# Patient Record
Sex: Female | Born: 1950
Health system: Southern US, Community
[De-identification: ages and names within clinical notes are randomized; demographics above are authoritative.]

## PROBLEM LIST (undated history)

## (undated) DIAGNOSIS — B2790 Infectious mononucleosis, unspecified without complication: Secondary | ICD-10-CM

## (undated) DIAGNOSIS — G43909 Migraine, unspecified, not intractable, without status migrainosus: Secondary | ICD-10-CM

## (undated) DIAGNOSIS — I509 Heart failure, unspecified: Secondary | ICD-10-CM

## (undated) DIAGNOSIS — D649 Anemia, unspecified: Secondary | ICD-10-CM

## (undated) DIAGNOSIS — E039 Hypothyroidism, unspecified: Secondary | ICD-10-CM

## (undated) DIAGNOSIS — I10 Essential (primary) hypertension: Secondary | ICD-10-CM

## (undated) DIAGNOSIS — D219 Benign neoplasm of connective and other soft tissue, unspecified: Secondary | ICD-10-CM

## (undated) DIAGNOSIS — G8929 Other chronic pain: Secondary | ICD-10-CM

## (undated) DIAGNOSIS — R5382 Chronic fatigue, unspecified: Secondary | ICD-10-CM

## (undated) HISTORY — PX: TONSILLECTOMY: SUR1361

## (undated) HISTORY — DX: Anemia, unspecified: D64.9

## (undated) HISTORY — DX: Hypothyroidism, unspecified: E03.9

---

## 2020-12-12 ENCOUNTER — Encounter (HOSPITAL_COMMUNITY): Payer: Self-pay | Admitting: Emergency Medicine

## 2020-12-12 ENCOUNTER — Emergency Department (HOSPITAL_COMMUNITY): Payer: Medicare Other

## 2020-12-12 ENCOUNTER — Inpatient Hospital Stay (HOSPITAL_COMMUNITY)
Admission: EM | Admit: 2020-12-12 | Discharge: 2020-12-18 | DRG: 291 | Disposition: A | Discharge status: Home Health | Payer: Medicare Other | Attending: Student | Admitting: Student

## 2020-12-12 DIAGNOSIS — I130 Hypertensive heart and chronic kidney disease with heart failure and stage 1 through stage 4 chronic kidney disease, or unspecified chronic kidney disease: Principal | ICD-10-CM | POA: Diagnosis present

## 2020-12-12 DIAGNOSIS — Z8249 Family history of ischemic heart disease and other diseases of the circulatory system: Secondary | ICD-10-CM | POA: Diagnosis not present

## 2020-12-12 DIAGNOSIS — N1831 Chronic kidney disease, stage 3a: Secondary | ICD-10-CM | POA: Diagnosis present

## 2020-12-12 DIAGNOSIS — I161 Hypertensive emergency: Secondary | ICD-10-CM | POA: Diagnosis not present

## 2020-12-12 DIAGNOSIS — I517 Cardiomegaly: Secondary | ICD-10-CM | POA: Diagnosis not present

## 2020-12-12 DIAGNOSIS — N950 Postmenopausal bleeding: Secondary | ICD-10-CM | POA: Diagnosis not present

## 2020-12-12 DIAGNOSIS — R7989 Other specified abnormal findings of blood chemistry: Secondary | ICD-10-CM | POA: Diagnosis not present

## 2020-12-12 DIAGNOSIS — I509 Heart failure, unspecified: Secondary | ICD-10-CM

## 2020-12-12 DIAGNOSIS — I110 Hypertensive heart disease with heart failure: Secondary | ICD-10-CM | POA: Diagnosis not present

## 2020-12-12 DIAGNOSIS — D649 Anemia, unspecified: Secondary | ICD-10-CM | POA: Diagnosis present

## 2020-12-12 DIAGNOSIS — R0600 Dyspnea, unspecified: Secondary | ICD-10-CM | POA: Diagnosis not present

## 2020-12-12 DIAGNOSIS — G8929 Other chronic pain: Secondary | ICD-10-CM | POA: Diagnosis not present

## 2020-12-12 DIAGNOSIS — Z6841 Body Mass Index (BMI) 40.0 and over, adult: Secondary | ICD-10-CM

## 2020-12-12 DIAGNOSIS — N938 Other specified abnormal uterine and vaginal bleeding: Secondary | ICD-10-CM | POA: Diagnosis present

## 2020-12-12 DIAGNOSIS — I5033 Acute on chronic diastolic (congestive) heart failure: Secondary | ICD-10-CM | POA: Diagnosis not present

## 2020-12-12 DIAGNOSIS — R5381 Other malaise: Secondary | ICD-10-CM | POA: Diagnosis not present

## 2020-12-12 DIAGNOSIS — R059 Cough, unspecified: Secondary | ICD-10-CM | POA: Diagnosis not present

## 2020-12-12 DIAGNOSIS — I5031 Acute diastolic (congestive) heart failure: Secondary | ICD-10-CM | POA: Diagnosis not present

## 2020-12-12 DIAGNOSIS — D62 Acute posthemorrhagic anemia: Secondary | ICD-10-CM | POA: Diagnosis not present

## 2020-12-12 DIAGNOSIS — N858 Other specified noninflammatory disorders of uterus: Secondary | ICD-10-CM | POA: Diagnosis present

## 2020-12-12 DIAGNOSIS — E876 Hypokalemia: Secondary | ICD-10-CM | POA: Diagnosis not present

## 2020-12-12 DIAGNOSIS — Z20822 Contact with and (suspected) exposure to covid-19: Secondary | ICD-10-CM | POA: Diagnosis present

## 2020-12-12 DIAGNOSIS — I313 Pericardial effusion (noninflammatory): Secondary | ICD-10-CM | POA: Diagnosis not present

## 2020-12-12 DIAGNOSIS — I3139 Other pericardial effusion (noninflammatory): Secondary | ICD-10-CM

## 2020-12-12 DIAGNOSIS — Z8679 Personal history of other diseases of the circulatory system: Secondary | ICD-10-CM | POA: Diagnosis not present

## 2020-12-12 DIAGNOSIS — I10 Essential (primary) hypertension: Secondary | ICD-10-CM | POA: Diagnosis not present

## 2020-12-12 DIAGNOSIS — N939 Abnormal uterine and vaginal bleeding, unspecified: Secondary | ICD-10-CM

## 2020-12-12 DIAGNOSIS — I350 Nonrheumatic aortic (valve) stenosis: Secondary | ICD-10-CM | POA: Diagnosis not present

## 2020-12-12 DIAGNOSIS — Z8709 Personal history of other diseases of the respiratory system: Secondary | ICD-10-CM | POA: Diagnosis not present

## 2020-12-12 DIAGNOSIS — E039 Hypothyroidism, unspecified: Secondary | ICD-10-CM | POA: Diagnosis present

## 2020-12-12 DIAGNOSIS — R0602 Shortness of breath: Secondary | ICD-10-CM | POA: Diagnosis not present

## 2020-12-12 DIAGNOSIS — I872 Venous insufficiency (chronic) (peripheral): Secondary | ICD-10-CM | POA: Diagnosis present

## 2020-12-12 DIAGNOSIS — I361 Nonrheumatic tricuspid (valve) insufficiency: Secondary | ICD-10-CM | POA: Diagnosis not present

## 2020-12-12 DIAGNOSIS — R269 Unspecified abnormalities of gait and mobility: Secondary | ICD-10-CM | POA: Diagnosis present

## 2020-12-12 HISTORY — DX: Heart failure, unspecified: I50.9

## 2020-12-12 HISTORY — DX: Migraine, unspecified, not intractable, without status migrainosus: G43.909

## 2020-12-12 HISTORY — DX: Infectious mononucleosis, unspecified without complication: B27.90

## 2020-12-12 HISTORY — DX: Other chronic pain: G89.29

## 2020-12-12 HISTORY — DX: Chronic fatigue, unspecified: R53.82

## 2020-12-12 HISTORY — DX: Benign neoplasm of connective and other soft tissue, unspecified: D21.9

## 2020-12-12 HISTORY — DX: Essential (primary) hypertension: I10

## 2020-12-12 LAB — COMPREHENSIVE METABOLIC PANEL
ALT: 13 U/L (ref 0–44)
AST: 20 U/L (ref 15–41)
Albumin: 3.7 g/dL (ref 3.5–5.0)
Alkaline Phosphatase: 61 U/L (ref 38–126)
Anion gap: 10 (ref 5–15)
BUN: 11 mg/dL (ref 8–23)
CO2: 23 mmol/L (ref 22–32)
Calcium: 9.1 mg/dL (ref 8.9–10.3)
Chloride: 103 mmol/L (ref 98–111)
Creatinine, Ser: 1.01 mg/dL — ABNORMAL HIGH (ref 0.44–1.00)
GFR, Estimated: >60 mL/min (ref >60)
Glucose, Bld: 117 mg/dL — ABNORMAL HIGH (ref 70–99)
Potassium: 2.7 mmol/L — CL (ref 3.5–5.1)
Sodium: 136 mmol/L (ref 135–145)
Total Bilirubin: 1.5 mg/dL — ABNORMAL HIGH (ref 0.3–1.2)
Total Protein: 7.2 g/dL (ref 6.5–8.1)

## 2020-12-12 LAB — RESP PANEL BY RT-PCR (FLU A&B, COVID) ARPGX2
Influenza A by PCR: NEGATIVE
Influenza B by PCR: NEGATIVE
SARS Coronavirus 2 by RT PCR: NEGATIVE

## 2020-12-12 LAB — CBC WITH DIFFERENTIAL/PLATELET
Abs Immature Granulocytes: 0.08 10*3/uL — ABNORMAL HIGH (ref 0.00–0.07)
Basophils Absolute: 0 10*3/uL (ref 0.0–0.1)
Basophils Relative: 0 %
Eosinophils Absolute: 0 10*3/uL (ref 0.0–0.5)
Eosinophils Relative: 0 %
HCT: 20.9 % — ABNORMAL LOW (ref 36.0–46.0)
Hemoglobin: 5.3 g/dL — CL (ref 12.0–15.0)
Immature Granulocytes: 1 %
Lymphocytes Relative: 11 %
Lymphs Abs: 1.1 10*3/uL (ref 0.7–4.0)
MCH: 17.6 pg — ABNORMAL LOW (ref 26.0–34.0)
MCHC: 25.4 g/dL — ABNORMAL LOW (ref 30.0–36.0)
MCV: 69.4 fL — ABNORMAL LOW (ref 80.0–100.0)
Monocytes Absolute: 0.5 10*3/uL (ref 0.1–1.0)
Monocytes Relative: 5 %
Neutro Abs: 8.5 10*3/uL — ABNORMAL HIGH (ref 1.7–7.7)
Neutrophils Relative %: 83 %
Platelets: 203 10*3/uL (ref 150–400)
RBC: 3.01 MIL/uL — ABNORMAL LOW (ref 3.87–5.11)
RDW: 21.2 % — ABNORMAL HIGH (ref 11.5–15.5)
WBC: 10.2 10*3/uL (ref 4.0–10.5)
nRBC: 0 % (ref 0.0–0.2)

## 2020-12-12 LAB — ABO/RH: ABO/RH(D): O POS

## 2020-12-12 LAB — TROPONIN I (HIGH SENSITIVITY)
Troponin I (High Sensitivity): 18 ng/L — ABNORMAL HIGH (ref <18)
Troponin I (High Sensitivity): 18 ng/L — ABNORMAL HIGH (ref <18)

## 2020-12-12 LAB — RETICULOCYTES
Immature Retic Fract: 29 % — ABNORMAL HIGH (ref 2.3–15.9)
RBC.: 3.17 MIL/uL — ABNORMAL LOW (ref 3.87–5.11)
Retic Count, Absolute: 74.5 10*3/uL (ref 19.0–186.0)
Retic Ct Pct: 2.4 % (ref 0.4–3.1)

## 2020-12-12 LAB — BRAIN NATRIURETIC PEPTIDE: B Natriuretic Peptide: 502.2 pg/mL — ABNORMAL HIGH (ref 0.0–100.0)

## 2020-12-12 LAB — IRON AND TIBC
Iron: 23 ug/dL — ABNORMAL LOW (ref 28–170)
Saturation Ratios: 5 % — ABNORMAL LOW (ref 10.4–31.8)
TIBC: 505 ug/dL — ABNORMAL HIGH (ref 250–450)
UIBC: 482 ug/dL

## 2020-12-12 LAB — MAGNESIUM: Magnesium: 1.6 mg/dL — ABNORMAL LOW (ref 1.7–2.4)

## 2020-12-12 LAB — FERRITIN: Ferritin: 7 ng/mL — ABNORMAL LOW (ref 11–307)

## 2020-12-12 LAB — PREPARE RBC (CROSSMATCH)

## 2020-12-12 LAB — POC OCCULT BLOOD, ED: Fecal Occult Bld: NEGATIVE

## 2020-12-12 LAB — LACTIC ACID, PLASMA: Lactic Acid, Venous: 1.6 mmol/L (ref 0.5–1.9)

## 2020-12-12 MED ORDER — SODIUM CHLORIDE 0.9% FLUSH
3.0000 mL | Freq: Two times a day (BID) | INTRAVENOUS | Status: DC
  Administered 2020-12-12 – 2020-12-18 (×11): 3 mL via INTRAVENOUS

## 2020-12-12 MED ORDER — FUROSEMIDE 10 MG/ML IJ SOLN
20.0000 mg | Freq: Two times a day (BID) | INTRAMUSCULAR | Status: DC
  Administered 2020-12-13 – 2020-12-15 (×6): 20 mg via INTRAVENOUS
  Filled 2020-12-12 (×7): qty 2

## 2020-12-12 MED ORDER — POTASSIUM CHLORIDE CRYS ER 20 MEQ PO TBCR
40.0000 meq | EXTENDED_RELEASE_TABLET | Freq: Once | ORAL | Status: AC
  Administered 2020-12-12: 40 meq via ORAL
  Filled 2020-12-12: qty 2

## 2020-12-12 MED ORDER — FUROSEMIDE 10 MG/ML IJ SOLN
60.0000 mg | Freq: Once | INTRAMUSCULAR | Status: AC
  Administered 2020-12-12: 60 mg via INTRAVENOUS
  Filled 2020-12-12: qty 8

## 2020-12-12 MED ORDER — SODIUM CHLORIDE 0.9 % IV SOLN
10.0000 mL/h | Freq: Once | INTRAVENOUS | Status: AC
  Administered 2020-12-12: 10 mL/h via INTRAVENOUS

## 2020-12-12 MED ORDER — ACETAMINOPHEN 325 MG PO TABS: 650.0000 mg | ORAL_TABLET | Freq: Four times a day (QID) | ORAL | Status: DC | PRN

## 2020-12-12 MED ORDER — ACETAMINOPHEN 650 MG RE SUPP: 650.0000 mg | Freq: Four times a day (QID) | RECTAL | Status: DC | PRN

## 2020-12-12 MED ORDER — POTASSIUM CHLORIDE 10 MEQ/100ML IV SOLN
10.0000 meq | Freq: Once | INTRAVENOUS | Status: AC
  Administered 2020-12-12: 10 meq via INTRAVENOUS
  Filled 2020-12-12: qty 100

## 2020-12-12 MED ORDER — HYDRALAZINE HCL 20 MG/ML IJ SOLN
5.0000 mg | INTRAMUSCULAR | Status: DC | PRN
  Administered 2020-12-12 – 2020-12-13 (×3): 5 mg via INTRAVENOUS
  Filled 2020-12-12 (×3): qty 1

## 2020-12-12 MED ORDER — POLYETHYLENE GLYCOL 3350 17 G PO PACK: 17.0000 g | PACK | Freq: Every day | ORAL | Status: DC | PRN

## 2020-12-12 MED ORDER — POTASSIUM CHLORIDE 10 MEQ/100ML IV SOLN
10.0000 meq | INTRAVENOUS | Status: AC
  Administered 2020-12-12 – 2020-12-13 (×3): 10 meq via INTRAVENOUS
  Filled 2020-12-12 (×2): qty 100

## 2020-12-12 NOTE — ED Triage Notes (Signed)
Patient cough and SOB x2 weeks. Denies fevers.

## 2020-12-12 NOTE — ED Provider Notes (Signed)
[facility] Provider Note   CSN: [id number] Arrival date & time: 12/12/20  1547     History Chief Complaint  Patient presents with  . Shortness of Breath    Cynthia Hill is a 70 y.o. female.  70 year old female with prior medical history as detailed below presents for evaluation.  Patient reports gradual worsening of shortness of breath over the last 2 weeks.  Patient symptoms have begun significantly more comfortable over the last 48 hours.  She complains of cough associated with significant dyspnea especially with exertion.  She denies associated chest pain or fever.  She reports increased edema to both lower extremities.  She denies prior history of CHF.  She reports that she was too weak to ambulate around her house this morning secondary to her dyspnea.   The history is provided by the patient and medical records.  Shortness of Breath Severity:  Moderate Onset quality:  Gradual Duration:  12 days Timing:  Constant Progression:  Worsening Chronicity:  New Context: activity   Relieved by:  Nothing Worsened by:  Nothing      History reviewed. No pertinent past medical history.  There are no problems to display for this patient.   History reviewed. No pertinent surgical history.   OB History   No obstetric history on file.     No family history on file.     Home Medications Prior to Admission medications   Not on File    Allergies    Patient has no known allergies.  Review of Systems   Review of Systems  Respiratory: Positive for shortness of breath.   All other systems reviewed and are negative.   Physical Exam Updated Vital Signs BP (!) 204/81   Pulse 87   Temp 98.3 F (36.8 C) (Oral)   Resp (!) 23   SpO2 96%   Physical Exam Vitals and nursing note reviewed.  Constitutional:      General: She is not in acute distress.    Appearance: She is well-developed.  HENT:     Head: Normocephalic and  atraumatic.  Eyes:     Conjunctiva/sclera: Conjunctivae normal.     Pupils: Pupils are equal, round, and reactive to light.  Cardiovascular:     Rate and Rhythm: Normal rate and regular rhythm.     Heart sounds: Normal heart sounds.  Pulmonary:     Effort: Pulmonary effort is normal. Tachypnea present. No respiratory distress.     Comments: Decreased BS at bilateral bases  Abdominal:     General: There is no distension.     Palpations: Abdomen is soft.     Tenderness: There is no abdominal tenderness.  Musculoskeletal:        General: No deformity. Normal range of motion.     Cervical back: Normal range of motion and neck supple.     Right lower leg: Edema present.     Left lower leg: Edema present.     Comments: 3 plus edema to BLE  Skin:    General: Skin is warm and dry.  Neurological:     General: No focal deficit present.     Mental Status: She is alert and oriented to person, place, and time.     ED Results / Procedures / Treatments   Labs (all labs ordered are listed, but only abnormal results are displayed) Labs Reviewed  CULTURE, BLOOD (ROUTINE X 2)  CULTURE, BLOOD (ROUTINE X 2)  RESP PANEL BY RT-PCR (FLU  A&B, COVID) ARPGX2  CBC WITH DIFFERENTIAL/PLATELET  LACTIC ACID, PLASMA  LACTIC ACID, PLASMA  COMPREHENSIVE METABOLIC PANEL  BRAIN NATRIURETIC PEPTIDE  TROPONIN I (HIGH SENSITIVITY)    EKG EKG Interpretation  Date/Time:  Sunday December 12 2020 16:07:04 EDT Ventricular Rate:  82 PR Interval:    QRS Duration: 120 QT Interval:  396 QTC Calculation: 463 R Axis:   -39 Text Interpretation: Normal sinus rhythm Poor Baseline Confirmed by Dene Gentry [phone]) on 12/12/2020 4:17:55 PM   Radiology DG Chest 2 View  Result Date: 12/12/2020 CLINICAL DATA:  Cough and shortness of breath x2 weeks. EXAM: CHEST - 2 VIEW COMPARISON:  None. FINDINGS: Mildly increased interstitial lung markings are seen with mild prominence of the pulmonary vasculature. There is no  evidence of a pleural effusion or pneumothorax. The cardiac silhouette is markedly enlarged. The visualized skeletal structures are unremarkable. IMPRESSION: Cardiomegaly with mild congestive heart failure. Electronically Signed   By: Virgina Norfolk M.D.   On: 12/12/2020 16:34    Procedures Procedures   CRITICAL CARE Performed by: Valarie Merino   Total critical care time: 45 minutes  Critical care time was exclusive of separately billable procedures and treating other patients.  Critical care was necessary to treat or prevent imminent or life-threatening deterioration.  Critical care was time spent personally by me on the following activities: development of treatment plan with patient and/or surrogate as well as nursing, discussions with consultants, evaluation of patient's response to treatment, examination of patient, obtaining history from patient or surrogate, ordering and performing treatments and interventions, ordering and review of laboratory studies, ordering and review of radiographic studies, pulse oximetry and re-evaluation of patient's condition.   Medications Ordered in ED Medications - No data to display  ED Course  I have reviewed the triage vital signs and the nursing notes.  Pertinent labs & imaging results that were available during my care of the patient were reviewed by me and considered in my medical decision making (see chart for details).    MDM Rules/Calculators/A&P                          MDM  Screen complete  Cynthia Hill was evaluated in Emergency Department on 12/12/2020 for the symptoms described in the history of present illness. She was evaluated in the context of the global COVID-19 pandemic, which necessitated consideration that the patient might be at risk for infection with the SARS-CoV-2 virus that causes COVID-19. Institutional protocols and algorithms that pertain to the evaluation of patients at risk for COVID-19 are in a state of  rapid change based on information released by regulatory bodies including the CDC and federal and state organizations. These policies and algorithms were followed during the patient's care in the ED.  Patient is presenting with complaint of gradually progressive dyspnea especially with exertion.  Patient is also complaining of lower extremity edema.  Patient's presentation is concerning for likely heart failure.  Screening labs reveal evidence of significant anemia.  Patient would benefit from admission for diuresis and transfusion.  Further work-up while in the inpatient setting for source of anemia.  Hospitalist service is aware of case and will evaluate for admission.   Final Clinical Impression(s) / ED Diagnoses Final diagnoses:  Dyspnea, unspecified type  Anemia, unspecified type  Hypertension, unspecified type  Congestive heart failure, unspecified HF chronicity, unspecified heart failure type ([place])    Rx / DC Orders ED Discharge Orders  None       Valarie Merino, MD 12/12/20 [phone]

## 2020-12-12 NOTE — H&P (Signed)
History and Physical   Alainah Phang DZH:[id number] DOB: 20-Mar-1951 DOA: 12/12/2020  PCP: Pcp, No   Patient coming from: Home  Chief Complaint: Shortness of breath  HPI: Cynthia Hill is a 70 y.o. female with unclear past medical history (none in our EMR) but believes she does have a history of anemia, HTN and migraine who presents with shortness of breath.  Patient states that she has had gradually worsening shortness of breath for the last week.  It has become significantly worse in the past 2 days.  And is especially bad with exertion.  She states she has been too weak to even walk around her home.  She has not taken anything for her symptoms as she takes no medications at home.  She reports cough and lower extremity edema. She denies fevers, chills, chest pain, abdominal pain, constipation, diarrhea, nausea, vomiting, dark or bloody stools.  ED Course: Vital signs in the ED were significant for blood pressure in the 419Q to 222L systolic, respiratory rate in the 20s.  Lab work-up showed CMP with potassium of 2.7, creatinine of 1.0, glucose of 117, T bili 1.5.  CBC showed hemoglobin of 5.3 with MCV of 69.  Lactic acid normal on first check, troponin XVIII on first check, BNP pending, respiratory panel for flu and Covid was negative.  FOBT was negative for occult blood.  Blood cultures were obtained and are pending.  Chest x-ray showed cardiomegaly and some mild pulmonary vascular prominence.  Patient received 60 mg IV Lasix and had 2 units of blood ordered in the ED.  Review of Systems: As per HPI otherwise all other systems reviewed and are negative.  Past Medical History:  Diagnosis Date  . Migraine    History reviewed. No pertinent surgical history.  Social History  reports that she has never smoked. She has never used smokeless tobacco. She reports that she does not drink alcohol and does not use drugs.  Allergies  Allergen Reactions  . Aspirin Other (See Comments)     unknown  . Sulfa Antibiotics Other (See Comments)    unknown  . Albuterol Other (See Comments)    Worsens asthma  . Atenolol Other (See Comments)    unknown  . Bactroban [Mupirocin] Swelling  . Benzocaine Other (See Comments)    Throat swelling  . Bumex [Bumetanide] Other (See Comments)    Throat swelling  . Lasix [Furosemide] Other (See Comments)    Speeded heart rate, trouble swallowing  . Levaquin [Levofloxacin] Other (See Comments)    unknown  . Morphine And Related Other (See Comments)    unknown  . Other Other (See Comments)    Sea food, shell fish, eggs  . Propranolol Other (See Comments)    unknown  . Ptu [Propylthiouracil] Other (See Comments)    Doubles heart rate  . Spironolactone Other (See Comments)    Throat swelling  . Tapazole [Methimazole] Other (See Comments)    Irregular heartbeat  . Tetracyclines & Related Other (See Comments)    Throat swelling  . Toprol Xl [Metoprolol] Other (See Comments)    unknown  . Triamterene Other (See Comments)    unknown  . Valium [Diazepam] Other (See Comments)    unknown    Family History  Problem Relation Age of Onset  . Heart disease Mother   . Blindness Mother   . Heart disease Father   Reviewed on admission  Prior to Admission medications   Not on File  No prior to admission medications  per patient  Physical Exam: Vitals:   12/12/20 1700 12/12/20 1715 12/12/20 1815 12/12/20 1830  BP: (!) 204/81 (!) 212/77 (!) 189/82 (!) 220/76  Pulse: 87 83 84 85  Resp: (!) 23 (!) 26 (!) 27 (!) 26  Temp:      TempSrc:      SpO2: 96% 97% 95% 98%   Physical Exam Constitutional:      Appearance: She is obese.     Comments: Mild distress due to shortness of breath  HENT:     Head: Normocephalic and atraumatic.     Mouth/Throat:     Mouth: Mucous membranes are moist.     Pharynx: Oropharynx is clear.  Eyes:     Extraocular Movements: Extraocular movements intact.     Pupils: Pupils are equal, round, and reactive to  light.  Cardiovascular:     Rate and Rhythm: Normal rate and regular rhythm.     Pulses: Normal pulses.     Heart sounds: Murmur heard.    Pulmonary:     Effort: Pulmonary effort is normal. No respiratory distress.     Breath sounds: Rales present.     Comments: Tachypnea Abdominal:     General: Bowel sounds are normal. There is no distension.     Palpations: Abdomen is soft.     Tenderness: There is no abdominal tenderness.  Musculoskeletal:        General: No swelling or deformity.  Skin:    General: Skin is warm and dry.     Coloration: Skin is pale.  Neurological:     General: No focal deficit present.     Mental Status: Mental status is at baseline.     Labs on Admission: I have personally reviewed following labs and imaging studies  CBC: Recent Labs  Lab 12/12/20 1710  WBC 10.2  NEUTROABS 8.5*  HGB 5.3*  HCT 20.9*  MCV 69.4*  PLT 846    Basic Metabolic Panel: Recent Labs  Lab 12/12/20 1710  NA 136  K 2.7*  CL 103  CO2 23  GLUCOSE 117*  BUN 11  CREATININE 1.01*  CALCIUM 9.1    GFR: CrCl cannot be calculated (Unknown ideal weight.).  Liver Function Tests: Recent Labs  Lab 12/12/20 1710  AST 20  ALT 13  ALKPHOS 61  BILITOT 1.5*  PROT 7.2  ALBUMIN 3.7    Urine analysis: No results found for: COLORURINE, APPEARANCEUR, LABSPEC, PHURINE, GLUCOSEU, HGBUR, BILIRUBINUR, KETONESUR, PROTEINUR, UROBILINOGEN, NITRITE, LEUKOCYTESUR  Radiological Exams on Admission: DG Chest 2 View  Result Date: 12/12/2020 CLINICAL DATA:  Cough and shortness of breath x2 weeks. EXAM: CHEST - 2 VIEW COMPARISON:  None. FINDINGS: Mildly increased interstitial lung markings are seen with mild prominence of the pulmonary vasculature. There is no evidence of a pleural effusion or pneumothorax. The cardiac silhouette is markedly enlarged. The visualized skeletal structures are unremarkable. IMPRESSION: Cardiomegaly with mild congestive heart failure. Electronically Signed    By: Virgina Norfolk M.D.   On: 12/12/2020 16:34   EKG: Independently reviewed.  Sinus rhythm at 82 bpm, QTC 463, RSR prime in V2, baseline wander.  No prior to compare.  Assessment/Plan Principal Problem:   Symptomatic anemia Active Problems:   Acute CHF (congestive heart failure) (HCC)   HTN (hypertension)   Hypokalemia  Symptomatic anemia > Microcytic > Patient with 2 weeks of shortness of breath especially bad in the past 2 days with significant DOE. > States she believes she has a history of anemia  but she does not remember details  > FOBT is negative this appears to be a gradual process. > Mild Lee elevated troponin at 18 likely due to small degree of strain in the setting of anemia and likely heart failure as below - Monitor on telemetry, vitals currently stable other than hypertension - Continue with transfusion of 2 units PRBCs - Check iron, ferritin, TIBC, reticulocytes - Trend CBC  Lower extremity edema CHF suspected > Cardiomegaly on chest x-ray. BNP elevated at 502 > In the setting of significantly elevated blood pressure > Does not report having any history of heart failure but does not seem to have frequent follow-up doctors > Received 60 mg IV Lasix in the ED - Echocardiogram - Strict I/O, daily weights - Continue Lasix at 20 mg IV twice daily as she is Lasix nave  Hypertension > Blood pressure in the 294T systolic in the ED > Has received 60 of IV Lasix though will be receiving blood and some fluids with her potassium > Avoid nodal blocking agents and possible new acute heart failure - We will add on as needed hydralazine   Hypokalemia > Potassium noted to be 2.7 on ED labs > Creatinine is 1.0 with no prior to compare to - Check magnesium - Replete potassium with 40 mEq p.o. and 40 mEq IV - Trend renal function and electrolytes  DVT prophylaxis: SCDs for now  Code Status:   Full  Family Communication:  None on admission Disposition Plan:   Patient  is from:  Home  Anticipated DC to:  Home  Anticipated DC date:  1 to 3 days  Anticipated DC barriers: None  Consults called:  None  Admission status:  Observation, telemetry   Severity of Illness: The appropriate patient status for this patient is OBSERVATION. Observation status is judged to be reasonable and necessary in order to provide the required intensity of service to ensure the patient's safety. The patient's presenting symptoms, physical exam findings, and initial radiographic and laboratory data in the context of their medical condition is felt to place them at decreased risk for further clinical deterioration. Furthermore, it is anticipated that the patient will be medically stable for discharge from the hospital within 2 midnights of admission. The following factors support the patient status of observation.   " The patient's presenting symptoms include shortness of breath. " The physical exam findings include lower extremity edema, Rales, murmur, pale. " The initial radiographic and laboratory data are chest x-ray with cardiomegaly and mild pulmonary vascular prominence, potassium 2.7, hemoglobin 5.3.  Troponin 18.   Marcelyn Bruins MD [facility]  How to contact the [facility] Attending or Consulting provider [place] or covering provider during after hours [place], for this patient?   1. Check the care team in [facility] and look for a) attending/consulting [facility] provider listed and b) the [facility] team listed 2. Log into [email or web address] and use [facility]'s universal password to access. If you do not have the password, please contact the hospital operator. 3. Locate the [facility] provider you are looking for under [facility] and page to a number that you can be directly reached. 4. If you still have difficulty reaching the provider, please page the [facility] (Director on Call) for the Hospitalists listed on amion for assistance.  12/12/2020, 7:26 PM

## 2020-12-12 NOTE — ED Notes (Signed)
Pt is hypertensive, MD made aware

## 2020-12-12 NOTE — ED Notes (Signed)
Pt has been instructed by her doctor that she can not have any food or drink at this time due to her condition and lab work is not yet finished. Pt continues to ask everyone who enters the room for food and water.

## 2020-12-13 ENCOUNTER — Observation Stay (HOSPITAL_COMMUNITY): Payer: Medicare Other

## 2020-12-13 ENCOUNTER — Other Ambulatory Visit: Payer: Self-pay

## 2020-12-13 DIAGNOSIS — I5033 Acute on chronic diastolic (congestive) heart failure: Secondary | ICD-10-CM | POA: Diagnosis present

## 2020-12-13 DIAGNOSIS — R269 Unspecified abnormalities of gait and mobility: Secondary | ICD-10-CM | POA: Diagnosis present

## 2020-12-13 DIAGNOSIS — I350 Nonrheumatic aortic (valve) stenosis: Secondary | ICD-10-CM

## 2020-12-13 DIAGNOSIS — I361 Nonrheumatic tricuspid (valve) insufficiency: Secondary | ICD-10-CM

## 2020-12-13 DIAGNOSIS — Z8249 Family history of ischemic heart disease and other diseases of the circulatory system: Secondary | ICD-10-CM | POA: Diagnosis not present

## 2020-12-13 DIAGNOSIS — N858 Other specified noninflammatory disorders of uterus: Secondary | ICD-10-CM | POA: Diagnosis present

## 2020-12-13 DIAGNOSIS — I313 Pericardial effusion (noninflammatory): Secondary | ICD-10-CM | POA: Diagnosis not present

## 2020-12-13 DIAGNOSIS — Z780 Asymptomatic menopausal state: Secondary | ICD-10-CM | POA: Diagnosis not present

## 2020-12-13 DIAGNOSIS — N938 Other specified abnormal uterine and vaginal bleeding: Secondary | ICD-10-CM | POA: Diagnosis present

## 2020-12-13 DIAGNOSIS — R5381 Other malaise: Secondary | ICD-10-CM | POA: Diagnosis not present

## 2020-12-13 DIAGNOSIS — Z6841 Body Mass Index (BMI) 40.0 and over, adult: Secondary | ICD-10-CM | POA: Diagnosis not present

## 2020-12-13 DIAGNOSIS — I5031 Acute diastolic (congestive) heart failure: Secondary | ICD-10-CM | POA: Diagnosis not present

## 2020-12-13 DIAGNOSIS — D649 Anemia, unspecified: Secondary | ICD-10-CM | POA: Diagnosis not present

## 2020-12-13 DIAGNOSIS — I161 Hypertensive emergency: Secondary | ICD-10-CM | POA: Diagnosis present

## 2020-12-13 DIAGNOSIS — R7989 Other specified abnormal findings of blood chemistry: Secondary | ICD-10-CM | POA: Diagnosis not present

## 2020-12-13 DIAGNOSIS — N1831 Chronic kidney disease, stage 3a: Secondary | ICD-10-CM | POA: Diagnosis present

## 2020-12-13 DIAGNOSIS — I872 Venous insufficiency (chronic) (peripheral): Secondary | ICD-10-CM | POA: Diagnosis present

## 2020-12-13 DIAGNOSIS — I10 Essential (primary) hypertension: Secondary | ICD-10-CM | POA: Diagnosis not present

## 2020-12-13 DIAGNOSIS — E039 Hypothyroidism, unspecified: Secondary | ICD-10-CM | POA: Diagnosis not present

## 2020-12-13 DIAGNOSIS — G8929 Other chronic pain: Secondary | ICD-10-CM | POA: Diagnosis present

## 2020-12-13 DIAGNOSIS — E876 Hypokalemia: Secondary | ICD-10-CM | POA: Diagnosis not present

## 2020-12-13 DIAGNOSIS — I130 Hypertensive heart and chronic kidney disease with heart failure and stage 1 through stage 4 chronic kidney disease, or unspecified chronic kidney disease: Secondary | ICD-10-CM | POA: Diagnosis present

## 2020-12-13 DIAGNOSIS — R0600 Dyspnea, unspecified: Secondary | ICD-10-CM | POA: Diagnosis not present

## 2020-12-13 DIAGNOSIS — D62 Acute posthemorrhagic anemia: Secondary | ICD-10-CM | POA: Diagnosis not present

## 2020-12-13 DIAGNOSIS — N950 Postmenopausal bleeding: Secondary | ICD-10-CM | POA: Diagnosis not present

## 2020-12-13 DIAGNOSIS — Z20822 Contact with and (suspected) exposure to covid-19: Secondary | ICD-10-CM | POA: Diagnosis present

## 2020-12-13 LAB — CBC
HCT: 27.4 % — ABNORMAL LOW (ref 36.0–46.0)
Hemoglobin: 7.9 g/dL — ABNORMAL LOW (ref 12.0–15.0)
MCH: 21.4 pg — ABNORMAL LOW (ref 26.0–34.0)
MCHC: 28.8 g/dL — ABNORMAL LOW (ref 30.0–36.0)
MCV: 74.3 fL — ABNORMAL LOW (ref 80.0–100.0)
Platelets: 199 10*3/uL (ref 150–400)
RBC: 3.69 MIL/uL — ABNORMAL LOW (ref 3.87–5.11)
RDW: 24.5 % — ABNORMAL HIGH (ref 11.5–15.5)
WBC: 9.4 10*3/uL (ref 4.0–10.5)
nRBC: 0 % (ref 0.0–0.2)

## 2020-12-13 LAB — COMPREHENSIVE METABOLIC PANEL
ALT: 13 U/L (ref 0–44)
AST: 22 U/L (ref 15–41)
Albumin: 3.6 g/dL (ref 3.5–5.0)
Alkaline Phosphatase: 59 U/L (ref 38–126)
Anion gap: 12 (ref 5–15)
BUN: 12 mg/dL (ref 8–23)
CO2: 24 mmol/L (ref 22–32)
Calcium: 9.2 mg/dL (ref 8.9–10.3)
Chloride: 102 mmol/L (ref 98–111)
Creatinine, Ser: 1.04 mg/dL — ABNORMAL HIGH (ref 0.44–1.00)
GFR, Estimated: 59 mL/min — ABNORMAL LOW (ref >60)
Glucose, Bld: 109 mg/dL — ABNORMAL HIGH (ref 70–99)
Potassium: 2.8 mmol/L — ABNORMAL LOW (ref 3.5–5.1)
Sodium: 138 mmol/L (ref 135–145)
Total Bilirubin: 2.2 mg/dL — ABNORMAL HIGH (ref 0.3–1.2)
Total Protein: 7.2 g/dL (ref 6.5–8.1)

## 2020-12-13 LAB — ECHOCARDIOGRAM COMPLETE
AR max vel: 1.05 cm2
AV Area VTI: 1.11 cm2
AV Area mean vel: 1 cm2
AV Mean grad: 10 mmHg
AV Peak grad: 18.3 mmHg
Ao pk vel: 2.14 m/s
Area-P 1/2: 3.91 cm2
Height: 60 in
S' Lateral: 3.5 cm
Weight: 3435.65 oz

## 2020-12-13 LAB — PROTIME-INR
INR: 1.4 — ABNORMAL HIGH (ref 0.8–1.2)
Prothrombin Time: 16.1 seconds — ABNORMAL HIGH (ref 11.4–15.2)

## 2020-12-13 LAB — HIV ANTIBODY (ROUTINE TESTING W REFLEX): HIV Screen 4th Generation wRfx: NONREACTIVE

## 2020-12-13 MED ORDER — POTASSIUM CHLORIDE 10 MEQ/100ML IV SOLN
INTRAVENOUS | Status: AC
  Filled 2020-12-13: qty 100

## 2020-12-13 MED ORDER — AMLODIPINE BESYLATE 5 MG PO TABS
5.0000 mg | ORAL_TABLET | Freq: Every day | ORAL | Status: DC
  Administered 2020-12-13 – 2020-12-14 (×2): 5 mg via ORAL
  Filled 2020-12-13 (×2): qty 1

## 2020-12-13 MED ORDER — ORAL CARE MOUTH RINSE
15.0000 mL | Freq: Two times a day (BID) | OROMUCOSAL | Status: DC
  Administered 2020-12-13 – 2020-12-18 (×11): 15 mL via OROMUCOSAL

## 2020-12-13 MED ORDER — POTASSIUM CHLORIDE CRYS ER 20 MEQ PO TBCR
40.0000 meq | EXTENDED_RELEASE_TABLET | Freq: Two times a day (BID) | ORAL | Status: DC
  Administered 2020-12-13 – 2020-12-14 (×4): 40 meq via ORAL
  Filled 2020-12-13 (×4): qty 2

## 2020-12-13 MED ORDER — HYDRALAZINE HCL 20 MG/ML IJ SOLN
10.0000 mg | Freq: Once | INTRAMUSCULAR | Status: AC
  Administered 2020-12-13: 10 mg via INTRAVENOUS
  Filled 2020-12-13: qty 1

## 2020-12-13 NOTE — Plan of Care (Signed)

## 2020-12-13 NOTE — Plan of Care (Signed)

## 2020-12-13 NOTE — Progress Notes (Addendum)
PROGRESS NOTE    Cynthia Hill  JHE:[id number] DOB: [birth year] DOA: 12/12/2020 PCP: Merryl Hacker, No   Brief Narrative:  Cynthia Hill is a 70 y.o. female with remarkably poor medical follow-up, states she has been seeing an acupuncturist for some years but has not seen a physician/NP/PA (primary care or otherwise) in "some time" without clear delineation.  Patient states she had acute onset fatigue dyspnea and weakness 24 to 48 hours prior to admission although reports she has been getting "more and more weak for some time" again difficult timeline as she discussed she has not really been able to walk for months if not years at this point dependent on a wheelchair outside the home and a walker inside the home.  Distant chart review reveals history of anemia hypertension and migraine but again this is quite distant.  She currently takes no medications.  In the ED patient presented with profound fatigue, tachycardia, hypokalemia 2.7 anemia with hemoglobin of 5.3, negative FOBT and unremarkable imaging.  Patient markedly volume overloaded on initial exam with 2+ pitting edema bilateral lower extremities and bibasilar rales.  Patient admitted to hospital service for further evaluation and work-up.  Assessment & Plan:   Principal Problem:   Symptomatic anemia Active Problems:   Acute CHF (congestive heart failure) (HCC)   HTN (hypertension)   Hypokalemia   Acute onset fatigue, dyspnea with exertion, profound ambulatory dysfunction POA, multifactorial Symptomatic anemia Symptomatic hypokalemia Rule out heart failure exacerbation -See below for individual treatment plan  Symptomatic anemia; likely iron deficient Cannot rule out acute versus chronic -Unclear baseline given lack of follow-up -Patient is a profoundly poor historian -denies any hematochezia or melena or bright red blood per rectum -Iron deficiency noted on preliminary labs, continue iron supplementation and dietary changes as  discussed with patient -FOBT negative thus far -Status post 2 units PRBCs  Acute hypoxic respiratory failure, POA Rule out heart failure exacerbation, this would be a new diagnosis  -Bilateral lower extremity edema, pitting to the knee, bibasilar rales  -Follow echocardiogram -Continue to wean oxygen as tolerated -Continue Lasix 20 twice daily IV(60 mg x 1 in the ED), follow I's and O's, low-salt diet  Intake/Output Summary (Last 24 hours) at 12/13/2020 1326 Last data filed at 12/13/2020 0830 Gross per 24 hour  Intake 1047.38 ml  Output 1650 ml  Net -602.62 ml   Hypertension - Systolic blood pressure as high as 200 in the ED - Status post diuretics, hold scheduled antihypertensives in lieu of of aggressive diuresis -As needed hydralazine for now, follow echocardiogram, if heart failure ruled in would likely dictate medication route with Entresto/beta-blocker/ACE/ARB  Hypokalemia, symptomatic -Again unclear if acute or chronic -No acute etiology, patient denies any GI losses, not on diuretics prior to admission -Expect decreased potassium with increased diuretics, follow closely and replete aggressively  DVT prophylaxis: SCDs for now given profound anemia - rule out bleeding/blood loss Code Status: Full  Family Communication: None present  Status is: Inpatient  Dispo: The patient is from: Home              Anticipated d/c is to: To be determined              Anticipated d/c date is: 40 to 72 hours              Patient currently not medically stable for discharge given need for IV diuretics, profound fatigue ambulatory dysfunction volume overload and further work-up including but not limited to imaging echocardiogram and PT  evaluation  Consultants:   None  Procedures:   None  Antimicrobials:  None indicated  Subjective: No acute issues or events overnight, patient's main complaint this morning is "poor diet -they will not feed me what I want."  She denies any chest  pain, headache, fever, chills, nausea, vomiting.  She does endorse ongoing dyspnea with exertion orthopnea and lower extremity edema.  Objective: Vitals:   12/13/20 0436 12/13/20 0509 12/13/20 0522 12/13/20 0845  BP: (!) 194/69 (!) 151/60 (!) 153/66 (!) 170/71  Pulse: 77 74 77 76  Resp: 20 20 20    Temp: 98.2 F (36.8 C) 97.9 F (36.6 C) 97.7 F (36.5 C) 98.1 F (36.7 C)  TempSrc: Oral Oral Oral Oral  SpO2:  100% 100% 100%  Weight:      Height:        Intake/Output Summary (Last 24 hours) at 12/13/2020 1326 Last data filed at 12/13/2020 0830 Gross per 24 hour  Intake 1047.38 ml  Output 1650 ml  Net -602.62 ml   Filed Weights   12/12/20 2030  Weight: 97.4 kg    Examination:  General:  Pleasantly resting in bed, No acute distress. HEENT:  Normocephalic atraumatic.  Sclerae nonicteric, noninjected.  Extraocular movements intact bilaterally. Neck:  Without mass or deformity.  Trachea is midline. Lungs: Bibasilar rales. Heart:  Regular rate and rhythm.  Without murmurs, rubs, or gallops. Abdomen:  Soft, nontender, obese, nondistended.  Without guarding or rebound. Extremities: 2+ pitting edema bilateral lower extremities to the knee Vascular:  Dorsalis pedis and posterior tibial pulses palpable bilaterally. Psych: Somewhat flat affect  Data Reviewed: I have personally reviewed following labs and imaging studies  CBC: Recent Labs  Lab 12/12/20 1710 12/13/20 1025  WBC 10.2 9.4  NEUTROABS 8.5*  --   HGB 5.3* 7.9*  HCT 20.9* 27.4*  MCV 69.4* 74.3*  PLT 203 981   Basic Metabolic Panel: Recent Labs  Lab 12/12/20 1710 12/12/20 1827 12/13/20 1025  NA 136  --  138  K 2.7*  --  2.8*  CL 103  --  102  CO2 23  --  24  GLUCOSE 117*  --  109*  BUN 11  --  12  CREATININE 1.01*  --  1.04*  CALCIUM 9.1  --  9.2  MG  --  1.6*  --    GFR: Estimated Creatinine Clearance: 54.2 mL/min (A) (by C-G formula based on SCr of 1.04 mg/dL (H)). Liver Function Tests: Recent Labs   Lab 12/12/20 1710 12/13/20 1025  AST 20 22  ALT 13 13  ALKPHOS 61 59  BILITOT 1.5* 2.2*  PROT 7.2 7.2  ALBUMIN 3.7 3.6   No results for input(s): LIPASE, AMYLASE in the last 168 hours. No results for input(s): AMMONIA in the last 168 hours. Coagulation Profile: Recent Labs  Lab 12/13/20 1025  INR 1.4*   Cardiac Enzymes: No results for input(s): CKTOTAL, CKMB, CKMBINDEX, TROPONINI in the last 168 hours. BNP (last 3 results) No results for input(s): PROBNP in the last 8760 hours. HbA1C: No results for input(s): HGBA1C in the last 72 hours. CBG: No results for input(s): GLUCAP in the last 168 hours. Lipid Profile: No results for input(s): CHOL, HDL, LDLCALC, TRIG, CHOLHDL, LDLDIRECT in the last 72 hours. Thyroid Function Tests: No results for input(s): TSH, T4TOTAL, FREET4, T3FREE, THYROIDAB in the last 72 hours. Anemia Panel: Recent Labs    12/12/20 2000  FERRITIN 7*  TIBC 505*  IRON 23*  RETICCTPCT 2.4  Sepsis Labs: Recent Labs  Lab 12/12/20 1710  LATICACIDVEN 1.6    Recent Results (from the past 240 hour(s))  Resp Panel by RT-PCR (Flu A&B, Covid) Nasopharyngeal Swab     Status: None   Collection Time: 12/12/20  5:10 PM   Specimen: Nasopharyngeal Swab; Nasopharyngeal(NP) swabs in vial transport medium  Result Value Ref Range Status   SARS Coronavirus 2 by RT PCR NEGATIVE NEGATIVE Final    Comment: (NOTE) SARS-CoV-2 target nucleic acids are NOT DETECTED.  The SARS-CoV-2 RNA is generally detectable in upper respiratory specimens during the acute phase of infection. The lowest concentration of SARS-CoV-2 viral copies this assay can detect is 138 copies/mL. A negative result does not preclude SARS-Cov-2 infection and should not be used as the sole basis for treatment or other patient management decisions. A negative result may occur with  improper specimen collection/handling, submission of specimen other than nasopharyngeal swab, presence of viral  mutation(s) within the areas targeted by this assay, and inadequate number of viral copies(<138 copies/mL). A negative result must be combined with clinical observations, patient history, and epidemiological information. The expected result is Negative.  Fact Sheet for Patients:  EntrepreneurPulse.com.au  Fact Sheet for Healthcare Providers:  IncredibleEmployment.be  This test is no t yet approved or cleared by the [place] FDA and  has been authorized for detection and/or diagnosis of SARS-CoV-2 by FDA under an Emergency Use Authorization (EUA). This EUA will remain  in effect (meaning this test can be used) for the duration of the COVID-19 declaration under Section 564(b)(1) of the Act, 21 U.S.C.section 360bbb-3(b)(1), unless the authorization is terminated  or revoked sooner.       Influenza A by PCR NEGATIVE NEGATIVE Final   Influenza B by PCR NEGATIVE NEGATIVE Final    Comment: (NOTE) The Xpert Xpress SARS-CoV-2/FLU/RSV plus assay is intended as an aid in the diagnosis of influenza from Nasopharyngeal swab specimens and should not be used as a sole basis for treatment. Nasal washings and aspirates are unacceptable for Xpert Xpress SARS-CoV-2/FLU/RSV testing.  Fact Sheet for Patients: EntrepreneurPulse.com.au  Fact Sheet for Healthcare Providers: IncredibleEmployment.be  This test is not yet approved or cleared by the [place] FDA and has been authorized for detection and/or diagnosis of SARS-CoV-2 by FDA under an Emergency Use Authorization (EUA). This EUA will remain in effect (meaning this test can be used) for the duration of the COVID-19 declaration under Section 564(b)(1) of the Act, 21 U.S.C. section 360bbb-3(b)(1), unless the authorization is terminated or revoked.  Performed at [facility], [place] [address] [place], [place] [address]     Radiology  Studies: DG Chest 2 View  Result Date: 12/12/2020 CLINICAL DATA:  Cough and shortness of breath x2 weeks. EXAM: CHEST - 2 VIEW COMPARISON:  None. FINDINGS: Mildly increased interstitial lung markings are seen with mild prominence of the pulmonary vasculature. There is no evidence of a pleural effusion or pneumothorax. The cardiac silhouette is markedly enlarged. The visualized skeletal structures are unremarkable. IMPRESSION: Cardiomegaly with mild congestive heart failure. Electronically Signed   By: Virgina Norfolk M.D.   On: 12/12/2020 16:34   Scheduled Meds: . furosemide  20 mg Intravenous BID  . mouth rinse  15 mL Mouth Rinse BID  . potassium chloride  40 mEq Oral BID  . sodium chloride flush  3 mL Intravenous Q12H   Continuous Infusions:    LOS: 0 days   Time spent: 79min  Mayline Dragon C Andie Mungin, DO [facility]  If 7PM-7AM, please contact night-coverage [email or web address]  12/13/2020, 1:26 PM

## 2020-12-13 NOTE — Hospital Course (Signed)
Cynthia Hill is a 70 y.o. female with remarkably poor medical follow-up, states she has been seeing an acupuncturist for some years but has not had a physician primary care or otherwise in "some time" without clear delineation.  Patient states she had acute onset fatigue dyspnea and weakness 24 to 48 hours prior to admission although reports she has been getting "more and more weak for some time" again difficult timeline as she discussed she has not really been able to walk for months if not years at this point dependent on a wheelchair outside the home and a walker inside the home.  Distant chart review reveals history of anemia hypertension and migraine but again this is quite distant.  She currently takes no medications.  In the ED patient presented with profound fatigue, tachycardia, hypokalemia 2.7 anemia with hemoglobin of 5.3, negative FOBT and unremarkable imaging.  Patient markedly volume overloaded on initial exam with 2+ pitting edema bilateral lower extremities and bibasilar rales.  Patient admitted to hospital service for further evaluation and work-up.

## 2020-12-13 NOTE — Plan of Care (Signed)
  Problem: Education: Goal: Knowledge of General Education information will improve Description: Including pain rating scale, medication(s)/side effects and non-pharmacologic comfort measures 12/13/2020 1219 by Mohammed Kindle, RN Outcome: Progressing 12/13/2020 1218 by Mohammed Kindle, RN Outcome: Adequate for Discharge   Problem: Health Behavior/Discharge Planning: Goal: Ability to manage health-related needs will improve 12/13/2020 1219 by Mohammed Kindle, RN Outcome: Progressing 12/13/2020 1218 by Mohammed Kindle, RN Outcome: Adequate for Discharge   Problem: Clinical Measurements: Goal: Ability to maintain clinical measurements within normal limits will improve 12/13/2020 1219 by Mohammed Kindle, RN Outcome: Progressing 12/13/2020 1218 by Mohammed Kindle, RN Outcome: Adequate for Discharge Goal: Will remain free from infection 12/13/2020 1219 by Mohammed Kindle, RN Outcome: Progressing 12/13/2020 1218 by Mohammed Kindle, RN Outcome: Adequate for Discharge Goal: Diagnostic test results will improve 12/13/2020 1219 by Mohammed Kindle, RN Outcome: Progressing 12/13/2020 1218 by Mohammed Kindle, RN Outcome: Adequate for Discharge Goal: Respiratory complications will improve 12/13/2020 1219 by Mohammed Kindle, RN Outcome: Progressing 12/13/2020 1218 by Mohammed Kindle, RN Outcome: Adequate for Discharge Goal: Cardiovascular complication will be avoided 12/13/2020 1219 by Mohammed Kindle, RN Outcome: Progressing 12/13/2020 1218 by Mohammed Kindle, RN Outcome: Adequate for Discharge   Problem: Activity: Goal: Risk for activity intolerance will decrease 12/13/2020 1219 by Mohammed Kindle, RN Outcome: Progressing 12/13/2020 1218 by Mohammed Kindle, RN Outcome: Adequate for Discharge   Problem: Nutrition: Goal: Adequate nutrition will be maintained 12/13/2020 1219 by Mohammed Kindle, RN Outcome:  Progressing 12/13/2020 1218 by Mohammed Kindle, RN Outcome: Adequate for Discharge   Problem: Coping: Goal: Level of anxiety will decrease 12/13/2020 1219 by Mohammed Kindle, RN Outcome: Progressing 12/13/2020 1218 by Mohammed Kindle, RN Outcome: Adequate for Discharge   Problem: Elimination: Goal: Will not experience complications related to bowel motility 12/13/2020 1219 by Mohammed Kindle, RN Outcome: Progressing 12/13/2020 1218 by Mohammed Kindle, RN Outcome: Adequate for Discharge Goal: Will not experience complications related to urinary retention 12/13/2020 1219 by Mohammed Kindle, RN Outcome: Progressing 12/13/2020 1218 by Mohammed Kindle, RN Outcome: Adequate for Discharge   Problem: Pain Managment: Goal: General experience of comfort will improve 12/13/2020 1219 by Mohammed Kindle, RN Outcome: Progressing 12/13/2020 1218 by Mohammed Kindle, RN Outcome: Adequate for Discharge   Problem: Safety: Goal: Ability to remain free from injury will improve 12/13/2020 1219 by Mohammed Kindle, RN Outcome: Progressing 12/13/2020 1218 by Mohammed Kindle, RN Outcome: Adequate for Discharge   Problem: Skin Integrity: Goal: Risk for impaired skin integrity will decrease 12/13/2020 1219 by Mohammed Kindle, RN Outcome: Progressing 12/13/2020 1218 by Mohammed Kindle, RN Outcome: Adequate for Discharge

## 2020-12-13 NOTE — Progress Notes (Signed)
Echocardiogram 2D Echocardiogram has been performed.  Cynthia Hill 12/13/2020, 12:16 PM

## 2020-12-14 LAB — TYPE AND SCREEN
ABO/RH(D): O POS
Antibody Screen: NEGATIVE
Unit division: 0
Unit division: 0

## 2020-12-14 LAB — COMPREHENSIVE METABOLIC PANEL
ALT: 14 U/L (ref 0–44)
AST: 22 U/L (ref 15–41)
Albumin: 3.6 g/dL (ref 3.5–5.0)
Alkaline Phosphatase: 59 U/L (ref 38–126)
Anion gap: 12 (ref 5–15)
BUN: 15 mg/dL (ref 8–23)
CO2: 26 mmol/L (ref 22–32)
Calcium: 8.8 mg/dL — ABNORMAL LOW (ref 8.9–10.3)
Chloride: 99 mmol/L (ref 98–111)
Creatinine, Ser: 1.3 mg/dL — ABNORMAL HIGH (ref 0.44–1.00)
GFR, Estimated: 45 mL/min — ABNORMAL LOW (ref >60)
Glucose, Bld: 95 mg/dL (ref 70–99)
Potassium: 3 mmol/L — ABNORMAL LOW (ref 3.5–5.1)
Sodium: 137 mmol/L (ref 135–145)
Total Bilirubin: 1.7 mg/dL — ABNORMAL HIGH (ref 0.3–1.2)
Total Protein: 7 g/dL (ref 6.5–8.1)

## 2020-12-14 LAB — BPAM RBC
Blood Product Expiration Date: 202204122359
Blood Product Expiration Date: 202204122359
ISSUE DATE / TIME: 202203140147
ISSUE DATE / TIME: 202203140459
Unit Type and Rh: 5100
Unit Type and Rh: 5100

## 2020-12-14 LAB — CBC
HCT: 28.1 % — ABNORMAL LOW (ref 36.0–46.0)
Hemoglobin: 7.9 g/dL — ABNORMAL LOW (ref 12.0–15.0)
MCH: 20.8 pg — ABNORMAL LOW (ref 26.0–34.0)
MCHC: 28.1 g/dL — ABNORMAL LOW (ref 30.0–36.0)
MCV: 73.9 fL — ABNORMAL LOW (ref 80.0–100.0)
Platelets: 210 10*3/uL (ref 150–400)
RBC: 3.8 MIL/uL — ABNORMAL LOW (ref 3.87–5.11)
RDW: 24.5 % — ABNORMAL HIGH (ref 11.5–15.5)
WBC: 7.6 10*3/uL (ref 4.0–10.5)
nRBC: 0 % (ref 0.0–0.2)

## 2020-12-14 NOTE — Progress Notes (Signed)
PROGRESS NOTE    Cynthia Hill  XLK:[id number] DOB: 01-18-1951 DOA: 12/12/2020 PCP: Merryl Hacker, No   Brief Narrative:  Cynthia Hill is a 70 y.o. female with remarkably poor medical follow-up, states she has been seeing an acupuncturist for some years but has not seen a physician/NP/PA (primary care or otherwise) in "some time" without clear delineation.  Patient states she had acute onset fatigue dyspnea and weakness 24 to 48 hours prior to admission although reports she has been getting "more and more weak for some time" again difficult timeline as she discussed she has not really been able to walk for months if not years at this point dependent on a wheelchair outside the home and a walker inside the home.  Distant chart review reveals history of anemia hypertension and migraine but again this is quite distant.  She currently takes no medications.  In the ED patient presented with profound fatigue, tachycardia, hypokalemia 2.7 anemia with hemoglobin of 5.3, negative FOBT and unremarkable imaging.  Patient markedly volume overloaded on initial exam with 2+ pitting edema bilateral lower extremities and bibasilar rales.  Patient admitted to hospital service for further evaluation and work-up.  Assessment & Plan:   Principal Problem:   Symptomatic anemia Active Problems:   Acute CHF (congestive heart failure) (HCC)   HTN (hypertension)   Hypokalemia   Acute onset fatigue, dyspnea with exertion, profound ambulatory dysfunction POA, multifactorial Symptomatic anemia Symptomatic hypokalemia New onset diastolic heart failure exacerbation -See below for individual treatment plan  New onset diastolic dysfunction heart failure, currently in exacerbation  -Echo remarkable for grade 2 diastolic dysfunction normal EF at 60% -Continue aggressive diuretics, patient remains volume overloaded, follow I's and O's, urine output improving dramatically over the past 48 hours -Heart failure likely in the  setting of longstanding hypertension given profoundly elevated blood pressure since admission despite treatment, patient also had remarkable anemia, questionably longstanding which may have exacerbated patient's situation.  Symptomatic anemia; likely iron deficient Cannot rule out acute versus chronic - Unclear baseline given lack of follow-up and previous labs - Patient is a profoundly poor historian - denies any hematochezia or melena or bright red blood per rectum when asked directly but does not typically check her stools so this is not reliable - Iron deficiency noted on preliminary labs, continue iron supplementation and dietary changes as discussed with patient - FOBT negative at intake - no reports of red/maroon/black stool per staff - Status post 2 units PRBCs - Hgb stable 7.9 so far  Acute hypoxic respiratory failure, POA Rule out heart failure exacerbation, this would be a new diagnosis  -Bilateral lower extremity edema, pitting to the knee, bibasilar rales  -Follow echocardiogram -Continue to wean oxygen as tolerated -Continue Lasix 20 twice daily IV(60 mg x 1 in the ED), follow I's and O's, low-salt diet  Intake/Output Summary (Last 24 hours) at 12/14/2020 0800 Last data filed at 12/14/2020 0757 Gross per 24 hour  Intake 351.33 ml  Output 1600 ml  Net -1248.67 ml   Hypertensive urgency - Systolic blood pressure as high as 200 in the ED and again yesterday late - Continue diuretics - will let patient run borderline high to ensure room for aggressive diuresis - Continue Amlodipine 5mg  for now  - Planned to start carvedilol given new diagnosis of HF but borderline bradycardic this am (50) likely add ACE/ARB prior to DC but holding now for diuresis.  Hypokalemia, symptomatic -Again unclear if acute or chronic -No acute etiology, patient denies any GI  losses, not on diuretics prior to admission -Expect decreased potassium with increased diuretics, follow closely and replete  aggressively  DVT prophylaxis: SCDs for now given profound anemia - rule out bleeding/blood loss Code Status: Full  Family Communication: None present  Status is: Inpatient  Dispo: The patient is from: Home              Anticipated d/c is to: To be determined              Anticipated d/c date is: 48 to 72 hours              Patient currently not medically stable for discharge given need for IV diuretics, profound fatigue ambulatory dysfunction volume overload and further work-up  Consultants:   None  Procedures:   None  Antimicrobials:  None indicated  Subjective: No acute issues or events overnight, patient's main complaint continues to be social issues including "poor diet, they will not let me sleep or let me walk" -generally a quite poor historian and review of systems somewhat limited but on specific questioning she denies nausea, vomiting, diarrhea, constipation, headache, fevers, chills.  She reports ongoing fatigue and weakness generally without focal deficit, she does admit to improving respiratory status but not yet back to baseline.  Objective: Vitals:   12/14/20 0412 12/14/20 0651 12/14/20 0722 12/14/20 0723  BP: (!) 152/56     Pulse: 83     Resp: 20     Temp: 98.2 F (36.8 C)     TempSrc: Oral     SpO2: 100%  98%   Weight:  98.2 kg  95.1 kg  Height:    5' (1.524 m)    Intake/Output Summary (Last 24 hours) at 12/14/2020 0800 Last data filed at 12/14/2020 0757 Gross per 24 hour  Intake 351.33 ml  Output 1600 ml  Net -1248.67 ml   Filed Weights   12/12/20 2030 12/14/20 0651 12/14/20 0723  Weight: 97.4 kg 98.2 kg 95.1 kg    Examination:  General:  Pleasantly resting in bed, No acute distress. HEENT:  Normocephalic atraumatic.  Sclerae nonicteric, noninjected.  Extraocular movements intact bilaterally. Neck:  Without mass or deformity.  Trachea is midline. Lungs: Bibasilar rales. Heart:  Regular rate and rhythm.  Without murmurs, rubs, or  gallops. Abdomen:  Soft, nontender, obese, nondistended.  Without guarding or rebound. Extremities: 2+ pitting edema bilateral lower extremities to the knee Vascular:  Dorsalis pedis and posterior tibial pulses palpable bilaterally. Psych: Somewhat flat affect  Data Reviewed: I have personally reviewed following labs and imaging studies  CBC: Recent Labs  Lab 12/12/20 1710 12/13/20 1025  WBC 10.2 9.4  NEUTROABS 8.5*  --   HGB 5.3* 7.9*  HCT 20.9* 27.4*  MCV 69.4* 74.3*  PLT 203 536   Basic Metabolic Panel: Recent Labs  Lab 12/12/20 1710 12/12/20 1827 12/13/20 1025  NA 136  --  138  K 2.7*  --  2.8*  CL 103  --  102  CO2 23  --  24  GLUCOSE 117*  --  109*  BUN 11  --  12  CREATININE 1.01*  --  1.04*  CALCIUM 9.1  --  9.2  MG  --  1.6*  --    GFR: Estimated Creatinine Clearance: 53.4 mL/min (A) (by C-G formula based on SCr of 1.04 mg/dL (H)). Liver Function Tests: Recent Labs  Lab 12/12/20 1710 12/13/20 1025  AST 20 22  ALT 13 13  ALKPHOS 61 59  BILITOT 1.5* 2.2*  PROT 7.2 7.2  ALBUMIN 3.7 3.6   No results for input(s): LIPASE, AMYLASE in the last 168 hours. No results for input(s): AMMONIA in the last 168 hours. Coagulation Profile: Recent Labs  Lab 12/13/20 1025  INR 1.4*   Cardiac Enzymes: No results for input(s): CKTOTAL, CKMB, CKMBINDEX, TROPONINI in the last 168 hours. BNP (last 3 results) No results for input(s): PROBNP in the last 8760 hours. HbA1C: No results for input(s): HGBA1C in the last 72 hours. CBG: No results for input(s): GLUCAP in the last 168 hours. Lipid Profile: No results for input(s): CHOL, HDL, LDLCALC, TRIG, CHOLHDL, LDLDIRECT in the last 72 hours. Thyroid Function Tests: No results for input(s): TSH, T4TOTAL, FREET4, T3FREE, THYROIDAB in the last 72 hours. Anemia Panel: Recent Labs    12/12/20 2000  FERRITIN 7*  TIBC 505*  IRON 23*  RETICCTPCT 2.4   Sepsis Labs: Recent Labs  Lab 12/12/20 1710  LATICACIDVEN 1.6     Recent Results (from the past 240 hour(s))  Resp Panel by RT-PCR (Flu A&B, Covid) Nasopharyngeal Swab     Status: None   Collection Time: 12/12/20  5:10 PM   Specimen: Nasopharyngeal Swab; Nasopharyngeal(NP) swabs in vial transport medium  Result Value Ref Range Status   SARS Coronavirus 2 by RT PCR NEGATIVE NEGATIVE Final    Comment: (NOTE) SARS-CoV-2 target nucleic acids are NOT DETECTED.  The SARS-CoV-2 RNA is generally detectable in upper respiratory specimens during the acute phase of infection. The lowest concentration of SARS-CoV-2 viral copies this assay can detect is 138 copies/mL. A negative result does not preclude SARS-Cov-2 infection and should not be used as the sole basis for treatment or other patient management decisions. A negative result may occur with  improper specimen collection/handling, submission of specimen other than nasopharyngeal swab, presence of viral mutation(s) within the areas targeted by this assay, and inadequate number of viral copies(<138 copies/mL). A negative result must be combined with clinical observations, patient history, and epidemiological information. The expected result is Negative.  Fact Sheet for Patients:  EntrepreneurPulse.com.au  Fact Sheet for Healthcare Providers:  IncredibleEmployment.be  This test is no t yet approved or cleared by the [place] FDA and  has been authorized for detection and/or diagnosis of SARS-CoV-2 by FDA under an Emergency Use Authorization (EUA). This EUA will remain  in effect (meaning this test can be used) for the duration of the COVID-19 declaration under Section 564(b)(1) of the Act, 21 U.S.C.section 360bbb-3(b)(1), unless the authorization is terminated  or revoked sooner.       Influenza A by PCR NEGATIVE NEGATIVE Final   Influenza B by PCR NEGATIVE NEGATIVE Final    Comment: (NOTE) The Xpert Xpress SARS-CoV-2/FLU/RSV plus assay is intended as an  aid in the diagnosis of influenza from Nasopharyngeal swab specimens and should not be used as a sole basis for treatment. Nasal washings and aspirates are unacceptable for Xpert Xpress SARS-CoV-2/FLU/RSV testing.  Fact Sheet for Patients: EntrepreneurPulse.com.au  Fact Sheet for Healthcare Providers: IncredibleEmployment.be  This test is not yet approved or cleared by the [place] FDA and has been authorized for detection and/or diagnosis of SARS-CoV-2 by FDA under an Emergency Use Authorization (EUA). This EUA will remain in effect (meaning this test can be used) for the duration of the COVID-19 declaration under Section 564(b)(1) of the Act, 21 U.S.C. section 360bbb-3(b)(1), unless the authorization is terminated or revoked.  Performed at [facility], [address] [place],  [place] [address]     Radiology Studies: DG Chest 2 View  Result Date: 12/12/2020 CLINICAL DATA:  Cough and shortness of breath x2 weeks. EXAM: CHEST - 2 VIEW COMPARISON:  None. FINDINGS: Mildly increased interstitial lung markings are seen with mild prominence of the pulmonary vasculature. There is no evidence of a pleural effusion or pneumothorax. The cardiac silhouette is markedly enlarged. The visualized skeletal structures are unremarkable. IMPRESSION: Cardiomegaly with mild congestive heart failure. Electronically Signed   By: Virgina Norfolk M.D.   On: 12/12/2020 16:34   ECHOCARDIOGRAM COMPLETE  Result Date: 12/13/2020    ECHOCARDIOGRAM REPORT   Patient Name:   TEREZA GILHAM Date of Exam: 12/13/2020 Medical Rec #:  [id number]         Height:       60.0 in Accession #:    [id number]        Weight:       214.7 lb Date of Birth:  [birth year]         BSA:          1.924 m Patient Age:    59 years          BP:           170/71 mmHg Patient Gender: F                 HR:           76 bpm. Exam Location:  Inpatient Procedure: 2D Echo, Cardiac Doppler  and Color Doppler Indications:    Dyspnea  History:        Patient has no prior history of Echocardiogram examinations.                 Signs/Symptoms:Dyspnea; Risk Factors:Hypertension.  Sonographer:    Dustin Flock Referring Phys: [id number] [place]  Sonographer Comments: Patient is morbidly obese. Image acquisition challenging due to uncooperative patient. Patient was uncooperative IMPRESSIONS  1. Left ventricular ejection fraction, by estimation, is 60 to 65%. The left ventricle has normal function. The left ventricle has no regional wall motion abnormalities. Left ventricular diastolic parameters are consistent with Grade II diastolic dysfunction (pseudonormalization).  2. Right ventricular systolic function is normal. The right ventricular size is normal. There is mildly elevated pulmonary artery systolic pressure.  3. Moderate pericardial effusion.  4. The mitral valve is grossly normal. Trivial mitral valve regurgitation. No evidence of mitral stenosis.  5. Tricuspid valve regurgitation is mild to moderate.  6. The aortic valve is grossly normal. Aortic valve regurgitation is not visualized. Mild aortic valve stenosis. FINDINGS  Left Ventricle: Left ventricular ejection fraction, by estimation, is 60 to 65%. The left ventricle has normal function. The left ventricle has no regional wall motion abnormalities. The left ventricular internal cavity size was small. There is no left ventricular hypertrophy. Left ventricular diastolic parameters are consistent with Grade II diastolic dysfunction (pseudonormalization). Right Ventricle: The right ventricular size is normal. No increase in right ventricular wall thickness. Right ventricular systolic function is normal. There is mildly elevated pulmonary artery systolic pressure. The tricuspid regurgitant velocity is 3.14  m/s, and with an assumed right atrial pressure of 3 mmHg, the estimated right ventricular systolic pressure is 54.6 mmHg. Left Atrium:  Left atrial size was normal in size. Right Atrium: Right atrial size was normal in size. Pericardium: A moderately sized pericardial effusion is present. Mitral Valve: The mitral valve is grossly normal. Trivial mitral valve regurgitation. No evidence of mitral valve stenosis. Tricuspid Valve:  The tricuspid valve is grossly normal. Tricuspid valve regurgitation is mild to moderate. Aortic Valve: The aortic valve is grossly normal. Aortic valve regurgitation is not visualized. Mild aortic stenosis is present. Aortic valve mean gradient measures 10.0 mmHg. Aortic valve peak gradient measures 18.3 mmHg. Aortic valve area, by VTI measures 1.11 cm. Pulmonic Valve: The pulmonic valve was not well visualized. Pulmonic valve regurgitation is not visualized. Aorta: The aortic root and ascending aorta are structurally normal, with no evidence of dilitation. IAS/Shunts: The atrial septum is grossly normal.  LEFT VENTRICLE PLAX 2D LVIDd:         4.70 cm  Diastology LVIDs:         3.50 cm  LV e' medial:    6.42 cm/s LV PW:         1.20 cm  LV E/e' medial:  22.1 LV IVS:        1.20 cm  LV e' lateral:   8.59 cm/s LVOT diam:     1.70 cm  LV E/e' lateral: 16.5 LV SV:         55 LV SV Index:   29 LVOT Area:     2.27 cm  RIGHT VENTRICLE RV Basal diam:  2.50 cm RV S prime:     7.51 cm/s TAPSE (M-mode): 2.5 cm LEFT ATRIUM             Index       RIGHT ATRIUM           Index LA diam:        3.90 cm 2.03 cm/m  RA Area:     16.20 cm LA Vol (A2C):   82.6 ml 42.94 ml/m RA Volume:   40.60 ml  21.10 ml/m LA Vol (A4C):   50.8 ml 26.41 ml/m LA Biplane Vol: 64.3 ml 33.42 ml/m  AORTIC VALVE AV Area (Vmax):    1.05 cm AV Area (Vmean):   1.00 cm AV Area (VTI):     1.11 cm AV Vmax:           213.67 cm/s AV Vmean:          147.500 cm/s AV VTI:            0.496 m AV Peak Grad:      18.3 mmHg AV Mean Grad:      10.0 mmHg LVOT Vmax:         99.20 cm/s LVOT Vmean:        64.900 cm/s LVOT VTI:          0.243 m LVOT/AV VTI ratio: 0.49  AORTA Ao  Root diam: 2.70 cm MITRAL VALVE                TRICUSPID VALVE MV Area (PHT): 3.91 cm     TR Peak grad:   39.4 mmHg MV Decel Time: 194 msec     TR Vmax:        314.00 cm/s MV E velocity: 142.00 cm/s MV A velocity: 107.00 cm/s  SHUNTS MV E/A ratio:  1.33         Systemic VTI:  0.24 m                             Systemic Diam: 1.70 cm Mertie Moores MD Electronically signed by Mertie Moores MD Signature Date/Time: 12/13/2020/3:43:11 PM    Final    Scheduled Meds: . amLODipine  5 mg Oral Daily  .  furosemide  20 mg Intravenous BID  . mouth rinse  15 mL Mouth Rinse BID  . potassium chloride  40 mEq Oral BID  . sodium chloride flush  3 mL Intravenous Q12H   Continuous Infusions:    LOS: 1 day   Time spent: 50min  William C Lancaster, DO [facility]  If 7PM-7AM, please contact night-coverage [email or web address]  12/14/2020, 8:00 AM

## 2020-12-14 NOTE — Plan of Care (Signed)

## 2020-12-15 ENCOUNTER — Inpatient Hospital Stay (HOSPITAL_COMMUNITY): Payer: Medicare Other

## 2020-12-15 DIAGNOSIS — D62 Acute posthemorrhagic anemia: Secondary | ICD-10-CM

## 2020-12-15 DIAGNOSIS — R5381 Other malaise: Secondary | ICD-10-CM

## 2020-12-15 DIAGNOSIS — I5031 Acute diastolic (congestive) heart failure: Secondary | ICD-10-CM

## 2020-12-15 LAB — COMPREHENSIVE METABOLIC PANEL
ALT: 11 U/L (ref 0–44)
AST: 19 U/L (ref 15–41)
Albumin: 3.3 g/dL — ABNORMAL LOW (ref 3.5–5.0)
Alkaline Phosphatase: 54 U/L (ref 38–126)
Anion gap: 10 (ref 5–15)
BUN: 16 mg/dL (ref 8–23)
CO2: 26 mmol/L (ref 22–32)
Calcium: 8.9 mg/dL (ref 8.9–10.3)
Chloride: 103 mmol/L (ref 98–111)
Creatinine, Ser: 1.15 mg/dL — ABNORMAL HIGH (ref 0.44–1.00)
GFR, Estimated: 52 mL/min — ABNORMAL LOW (ref >60)
Glucose, Bld: 96 mg/dL (ref 70–99)
Potassium: 3.3 mmol/L — ABNORMAL LOW (ref 3.5–5.1)
Sodium: 139 mmol/L (ref 135–145)
Total Bilirubin: 1.2 mg/dL (ref 0.3–1.2)
Total Protein: 6.4 g/dL — ABNORMAL LOW (ref 6.5–8.1)

## 2020-12-15 LAB — CBC
HCT: 25.3 % — ABNORMAL LOW (ref 36.0–46.0)
Hemoglobin: 7.2 g/dL — ABNORMAL LOW (ref 12.0–15.0)
MCH: 21.1 pg — ABNORMAL LOW (ref 26.0–34.0)
MCHC: 28.5 g/dL — ABNORMAL LOW (ref 30.0–36.0)
MCV: 74.2 fL — ABNORMAL LOW (ref 80.0–100.0)
Platelets: 191 10*3/uL (ref 150–400)
RBC: 3.41 MIL/uL — ABNORMAL LOW (ref 3.87–5.11)
RDW: 24.9 % — ABNORMAL HIGH (ref 11.5–15.5)
WBC: 7.8 10*3/uL (ref 4.0–10.5)
nRBC: 0 % (ref 0.0–0.2)

## 2020-12-15 MED ORDER — SODIUM CHLORIDE 0.9 % IV SOLN
510.0000 mg | Freq: Once | INTRAVENOUS | Status: AC
  Administered 2020-12-15: 510 mg via INTRAVENOUS
  Filled 2020-12-15: qty 510

## 2020-12-15 MED ORDER — POTASSIUM CHLORIDE CRYS ER 20 MEQ PO TBCR
40.0000 meq | EXTENDED_RELEASE_TABLET | ORAL | Status: AC
  Administered 2020-12-15 (×2): 40 meq via ORAL
  Filled 2020-12-15 (×2): qty 2

## 2020-12-15 NOTE — Progress Notes (Signed)
PROGRESS NOTE  Cynthia Hill HQI:[id number] DOB: March 23, 1951   PCP: Pcp, No  Patient is from: Home.  Lives alone.  Uses walker at baseline.  DOA: 12/12/2020 LOS: 2  Chief complaints: Fatigue, shortness of breath, leg swelling  Brief Narrative / Interim history: 70 year old F with unknown PMH as she doesn't follow up with provider presenting with progressive fatigue and dyspnea that has acutely gotten worse about 24 to 48 hours prior to admission.  Patient does not take medication saying "she doesn't get along with conventional medicine".  Instead, she follows up with "[place] doctor" and uses herbal remedy.  On admission, Hgb 5.3 (unknown baseline).  FOBT negative.  K2.7.  2+ pitting edema in BLE.  Transfused 2 units with appropriate response.  Started on IV Lasix.  On further questioning, she reports intermittent vaginal bleeding where she passes blood clot.  She says she has history of "fibroids" had evaluation in [place].   Subjective: Seen and examined earlier this morning.  No major events overnight or this morning.  Reports improvement in her breathing and fatigue.  She denies chest pain, dizziness, nausea, vomiting or abdominal pain. RN noted fresh red blood in commode this morning.  Patient intermittent chronic intermittent vaginal bleeding where she passes blood clots.  She says it does not happen quite often.   Objective: Vitals:   12/14/20 0843 12/14/20 1508 12/14/20 2036 12/15/20 1437  BP: 115/82 (!) 159/69 (!) 145/75 (!) 166/68  Pulse: (!) 50 87 88 78  Resp: 15 17  20   Temp: 98.4 F (36.9 C) 98.4 F (36.9 C) 99 F (37.2 C) 98.3 F (36.8 C)  TempSrc: Oral Oral Oral Oral  SpO2: 100% 99% 100% 93%  Weight:      Height:        Intake/Output Summary (Last 24 hours) at 12/15/2020 1726 Last data filed at 12/15/2020 1500 Gross per 24 hour  Intake 240 ml  Output 1700 ml  Net -1460 ml   Filed Weights   12/12/20 2030 12/14/20 0651 12/14/20 0723  Weight: 97.4 kg 98.2 kg  95.1 kg    Examination:  GENERAL: No apparent distress.  Nontoxic. HEENT: MMM.  Vision and hearing grossly intact.  NECK: Supple.  No apparent JVD.  RESP: 100% on RA.  No IWOB.  Fair aeration bilaterally. CVS:  RRR. Heart sounds normal.  ABD/GI/GU: BS+. Abd soft, NTND.  MSK/EXT:  Moves extremities. No apparent deformity.  1+ pitting edema bilaterally. SKIN: no apparent skin lesion or wound NEURO: Awake, alert and oriented appropriately.  No apparent focal neuro deficit. PSYCH: Calm. Normal affect.  Procedures:  None  Microbiology summarized: [id number] and influenza PCR nonreactive. Blood cultures NGTD.  Assessment & Plan: Acute on chronic blood loss symptomatic anemia in the setting of intermittent vaginal bleed? Iron deficiency anemia: Anemia panel with severe iron deficiency Recent Labs    12/12/20 1710 12/13/20 1025 12/14/20 0810 12/15/20 0425  HGB 5.3* 7.9* 7.9* 7.2*  -Reports vaginal bleed with blood clots intermittently.  Reports history of fibroids.  FOBT negative -Transfused 2 units with slightly exaggerated response. H&H seems to be stable -IV Feraheme x1.  Needs repeat IV iron after 3 days -Start p.o. ferrous sulfate as well -Pelvic and transvaginal ultrasound to evaluate vaginal bleeding  Acute diastolic CHF: TTE with LVEF of 60 to 65%, G2-DD.  Has significant BLE edema with some underlying chronic venous insufficiency.  Respiratory failure seems to have resolved.  Had about 1.7 L UOP/24 hours with 2 unmeasured voids.  Renal  function improved.  SBP slightly elevated. -Continue IV Lasix 20 mg twice daily -Discontinue amlodipine given BLE edema -Monitor fluid status, renal functions and electrolytes  Acute respiratory failure with hypoxia-likely due to anemia and CHF.  Resolved.  Hypertensive urgency-improved.  SBP in 60s. -IV Lasix as above -As needed hydralazine  Slightly elevated troponin likely demand ischemia in the setting of the above.  No RWMA on  TTE  Hypokalemia: K3.3. -K-Dur 40 mEq x 2 -Recheck in the morning  Debility/generalized weakness/fatigue-likely due to anemia and CHF.  Lives alone.  Uses walker at baseline. -PT/OT  Morbid obesity Body mass index is 40.95 kg/m.  -Encourage lifestyle change to lose weight.       DVT prophylaxis:  SCDs Start: 12/12/20 1848  Code Status: Full code Family Communication: Updated patient's brother at bedside. Level of care: Telemetry Status is: Inpatient  Remains inpatient appropriate because:Persistent severe electrolyte disturbances, Ongoing diagnostic testing needed not appropriate for outpatient work up, IV treatments appropriate due to intensity of illness or inability to take PO and Inpatient level of care appropriate due to severity of illness   Dispo: The patient is from: Home              Anticipated d/c is to: Home              Patient currently is not medically stable to d/c.   Difficult to place patient No       Consultants:  None   Sch Meds:  Scheduled Meds: . furosemide  20 mg Intravenous BID  . mouth rinse  15 mL Mouth Rinse BID  . sodium chloride flush  3 mL Intravenous Q12H   Continuous Infusions: . ferumoxytol     PRN Meds:.acetaminophen **OR** acetaminophen, hydrALAZINE, polyethylene glycol  Antimicrobials: Anti-infectives (From admission, onward)   None       I have personally reviewed the following labs and images: CBC: Recent Labs  Lab 12/12/20 1710 12/13/20 1025 12/14/20 0810 12/15/20 0425  WBC 10.2 9.4 7.6 7.8  NEUTROABS 8.5*  --   --   --   HGB 5.3* 7.9* 7.9* 7.2*  HCT 20.9* 27.4* 28.1* 25.3*  MCV 69.4* 74.3* 73.9* 74.2*  PLT 203 199 210 191   BMP &GFR Recent Labs  Lab 12/12/20 1710 12/12/20 1827 12/13/20 1025 12/14/20 0810 12/15/20 0425  NA 136  --  138 137 139  K 2.7*  --  2.8* 3.0* 3.3*  CL 103  --  102 99 103  CO2 23  --  24 26 26   GLUCOSE 117*  --  109* 95 96  BUN 11  --  12 15 16   CREATININE 1.01*  --   1.04* 1.30* 1.15*  CALCIUM 9.1  --  9.2 8.8* 8.9  MG  --  1.6*  --   --   --    Estimated Creatinine Clearance: 48.3 mL/min (A) (by C-G formula based on SCr of 1.15 mg/dL (H)). Liver & Pancreas: Recent Labs  Lab 12/12/20 1710 12/13/20 1025 12/14/20 0810 12/15/20 0425  AST 20 22 22 19   ALT 13 13 14 11   ALKPHOS 61 59 59 54  BILITOT 1.5* 2.2* 1.7* 1.2  PROT 7.2 7.2 7.0 6.4*  ALBUMIN 3.7 3.6 3.6 3.3*   No results for input(s): LIPASE, AMYLASE in the last 168 hours. No results for input(s): AMMONIA in the last 168 hours. Diabetic: No results for input(s): HGBA1C in the last 72 hours. No results for input(s): GLUCAP in the last  168 hours. Cardiac Enzymes: No results for input(s): CKTOTAL, CKMB, CKMBINDEX, TROPONINI in the last 168 hours. No results for input(s): PROBNP in the last 8760 hours. Coagulation Profile: Recent Labs  Lab 12/13/20 1025  INR 1.4*   Thyroid Function Tests: No results for input(s): TSH, T4TOTAL, FREET4, T3FREE, THYROIDAB in the last 72 hours. Lipid Profile: No results for input(s): CHOL, HDL, LDLCALC, TRIG, CHOLHDL, LDLDIRECT in the last 72 hours. Anemia Panel: Recent Labs    12/12/20 2000  FERRITIN 7*  TIBC 505*  IRON 23*  RETICCTPCT 2.4   Urine analysis: No results found for: COLORURINE, APPEARANCEUR, LABSPEC, PHURINE, GLUCOSEU, HGBUR, BILIRUBINUR, KETONESUR, PROTEINUR, UROBILINOGEN, NITRITE, LEUKOCYTESUR Sepsis Labs: Invalid input(s): PROCALCITONIN, [place]  Microbiology: Recent Results (from the past 240 hour(s))  Culture, blood (routine x 2)     Status: None (Preliminary result)   Collection Time: 12/12/20  5:00 PM   Specimen: BLOOD  Result Value Ref Range Status   Specimen Description   Final    BLOOD LEFT ANTECUBITAL Performed at [facility] [address] [place], [place] [address]    Special Requests   Final    BOTTLES DRAWN AEROBIC AND ANAEROBIC Blood Culture adequate volume Performed at [facility] [address] [place], [place] [address]    Culture   Final    NO GROWTH 2 DAYS Performed at [facility] [address], [place], [place] [address]    Report Status PENDING  Incomplete  Culture, blood (routine x 2)     Status: None (Preliminary result)   Collection Time: 12/12/20  5:10 PM   Specimen: BLOOD  Result Value Ref Range Status   Specimen Description   Final    BLOOD RIGHT ANTECUBITAL Performed at [facility] [address] [place], [place] [address]    Special Requests   Final    BOTTLES DRAWN AEROBIC AND ANAEROBIC Blood Culture adequate volume Performed at [facility] [address] [place], [place] [address]    Culture   Final    NO GROWTH 2 DAYS Performed at [facility] [address], [place], [place] [address]    Report Status PENDING  Incomplete  Resp Panel by RT-PCR (Flu A&B, Covid) Nasopharyngeal Swab     Status: None   Collection Time: 12/12/20  5:10 PM   Specimen: Nasopharyngeal Swab; Nasopharyngeal(NP) swabs in vial transport medium  Result Value Ref Range Status   SARS Coronavirus 2 by RT PCR NEGATIVE NEGATIVE Final    Comment: (NOTE) SARS-CoV-2 target nucleic acids are NOT DETECTED.  The SARS-CoV-2 RNA is generally detectable in upper respiratory specimens during the acute phase of infection. The lowest concentration of SARS-CoV-2 viral copies this assay can detect is 138 copies/mL. A negative result does not preclude SARS-Cov-2 infection and should not be used as the sole basis for treatment or other patient management decisions. A negative result may occur with  improper specimen collection/handling, submission of specimen other than nasopharyngeal swab, presence of viral mutation(s) within the areas targeted by this assay, and inadequate number of viral copies(<138 copies/mL). A negative result must be combined with clinical observations, patient history, and  epidemiological information. The expected result is Negative.  Fact Sheet for Patients:  EntrepreneurPulse.com.au  Fact Sheet for Healthcare Providers:  IncredibleEmployment.be  This test is no t yet approved or cleared by the [place] FDA and  has been authorized for detection and/or diagnosis of SARS-CoV-2 by FDA  under an Emergency Use Authorization (EUA). This EUA will remain  in effect (meaning this test can be used) for the duration of the COVID-19 declaration under Section 564(b)(1) of the Act, 21 U.S.C.section 360bbb-3(b)(1), unless the authorization is terminated  or revoked sooner.       Influenza A by PCR NEGATIVE NEGATIVE Final   Influenza B by PCR NEGATIVE NEGATIVE Final    Comment: (NOTE) The Xpert Xpress SARS-CoV-2/FLU/RSV plus assay is intended as an aid in the diagnosis of influenza from Nasopharyngeal swab specimens and should not be used as a sole basis for treatment. Nasal washings and aspirates are unacceptable for Xpert Xpress SARS-CoV-2/FLU/RSV testing.  Fact Sheet for Patients: EntrepreneurPulse.com.au  Fact Sheet for Healthcare Providers: IncredibleEmployment.be  This test is not yet approved or cleared by the [place] FDA and has been authorized for detection and/or diagnosis of SARS-CoV-2 by FDA under an Emergency Use Authorization (EUA). This EUA will remain in effect (meaning this test can be used) for the duration of the COVID-19 declaration under Section 564(b)(1) of the Act, 21 U.S.C. section 360bbb-3(b)(1), unless the authorization is terminated or revoked.  Performed at [facility], [place] [address], [place], [place] [address]     Radiology Studies: No results found.    Taye T. [place]  If 7PM-7AM, please contact night-coverage [email or web address] 12/15/2020, 5:26 PM

## 2020-12-15 NOTE — Evaluation (Signed)
Physical Therapy Evaluation Patient Details Name: Cynthia Hill MRN: [id number] DOB: 01/06/1951 Today's Date: 12/15/2020   History of Present Illness  70 y.o. female with remarkably poor medical follow-up, states she has been seeing an acupuncturist for some years but has not seen a physician/NP/PA (primary care or otherwise) in "some time" without clear delineation. Pt admitted for acute onset fatigue, dyspnea with exertion, profound ambulatory dysfunction POA, and found to have Symptomatic anemia  Clinical Impression  Pt admitted with above diagnosis.  Pt currently with functional limitations due to the deficits listed below (see PT Problem List). Pt will benefit from skilled PT to increase their independence and safety with mobility to allow discharge to the venue listed below.  Pt able to mobilize safely with RW however reports generalized weakness and fatigue.  Pt presents with poor short term memory (observed by both therapist and RN) and therefore concern for safety at home since pt lives alone.     Follow Up Recommendations Home health PT;Supervision - Intermittent    Equipment Recommendations  None recommended by PT    Recommendations for Other Services       Precautions / Restrictions Precautions Precautions: Fall      Mobility  Bed Mobility               General bed mobility comments: pt standing at sink with nurse tech on arrival    Transfers Overall transfer level: Needs assistance Equipment used: Rolling walker (2 wheeled) Transfers: Sit to/from Stand Sit to Stand: Min guard         General transfer comment: cues for hand placement when using RW  Ambulation/Gait Ambulation/Gait assistance: Min guard Gait Distance (Feet): 80 Feet Assistive device: Rolling walker (2 wheeled) Gait Pattern/deviations: Step-through pattern;Decreased stride length     General Gait Details: verbal cues for safe use of RW, HR 80 bpm during ambulation; pt reports  feeling weak  Stairs            Wheelchair Mobility    Modified Rankin (Stroke Patients Only)       Balance Overall balance assessment: No apparent balance deficits (not formally assessed)                                           Pertinent Vitals/Pain Pain Assessment: No/denies pain Faces Pain Scale: Hurts a little bit Pain Location: migraine Pain Descriptors / Indicators: Headache Pain Intervention(s): Monitored during session    Home Living Family/patient expects to be discharged to:: Private residence Living Arrangements: Alone Available Help at Discharge: Family;Available PRN/intermittently Type of Home: House Home Access: Level entry     Home Layout: One level Home Equipment: Walker - 2 wheels;Cane - single point;Wheelchair - manual;Bedside commode Additional Comments: friends help with groceries and cleaning per pt    Prior Function Level of Independence: Independent with assistive device(s)         Comments: does not do the stairs; uses w/c for community; [place] or RW in home; does not use shower - states she is worried about "breathing" so she performs "bird bathes"     Hand Dominance   Dominant Hand: Right    Extremity/Trunk Assessment        Lower Extremity Assessment Lower Extremity Assessment: Generalized weakness    Cervical / Trunk Assessment Cervical / Trunk Assessment: Normal  Communication   Communication: No difficulties  Cognition Arousal/Alertness: Awake/alert  Behavior During Therapy: WFL for tasks assessed/performed Overall Cognitive Status: No family/caregiver present to determine baseline cognitive functioning                                 General Comments: poor short term memory observed      General Comments General comments (skin integrity, edema, etc.): Pt able to stand and perform pericare for at least 5 minutes (had already been performing on arrival with nurse tech prior to  entering room as well).    Exercises     Assessment/Plan    PT Assessment Patient needs continued PT services  PT Problem List Decreased strength;Decreased mobility;Decreased activity tolerance;Decreased knowledge of use of DME;Decreased safety awareness       PT Treatment Interventions Gait training;DME instruction;Therapeutic exercise;Balance training;Functional mobility training;Therapeutic activities;Patient/family education    PT Goals (Current goals can be found in the Care Plan section)  Acute Rehab PT Goals PT Goal Formulation: With patient Time For Goal Achievement: 12/29/20 Potential to Achieve Goals: Good    Frequency Min 3X/week   Barriers to discharge        Co-evaluation               AM-PAC PT "6 Clicks" Mobility  Outcome Measure Help needed turning from your back to your side while in a flat bed without using bedrails?: None Help needed moving from lying on your back to sitting on the side of a flat bed without using bedrails?: None Help needed moving to and from a bed to a chair (including a wheelchair)?: A Little Help needed standing up from a chair using your arms (e.g., wheelchair or bedside chair)?: A Little Help needed to walk in hospital room?: A Little Help needed climbing 3-5 steps with a railing? : A Little 6 Click Score: 20    End of Session Equipment Utilized During Treatment: Gait belt Activity Tolerance: Patient tolerated treatment well Patient left: in chair;with call bell/phone within reach;with chair alarm set Nurse Communication: Mobility status PT Visit Diagnosis: Difficulty in walking, not elsewhere classified (R26.2)    Time: 2831-5176 PT Time Calculation (min) (ACUTE ONLY): 22 min   Charges:   PT Evaluation $PT Eval Low Complexity: 1 Low     Kati PT, DPT Acute Rehabilitation Services Pager: [phone] Office: [phone]  York Ram E 12/15/2020, 1:08 PM

## 2020-12-15 NOTE — Plan of Care (Signed)

## 2020-12-15 NOTE — Progress Notes (Signed)
Occupational Therapy Evaluation  Patient moved to [facility] ~9 months ago per brother, he lives about 5 miles from patient. She is mod I at baseline with BADLs, uses cane or walker around the house and w/c for community distances. Patient states she has friends to assist with groceries and cleaning. Currently patient is min G for functional mobility/transfers with rolling walker, min A for LB ADL and set up/supervision for UB ADLs.  The Short blessed test  was administered during session. This screening test in itself is insufficient to diagnose a dementing disorder. The short blessed test is however quite sensitive to early cognitive changes associated with Alzheimer's disease. The patient had 6 errors which is suggestive of Questionable Impairment. The patient scored the most poorly on sections containing memory questions. Patient would benefit from further testing especially in functional settings to determine safety with IADL tasks such as meal prep and medication management. Acute OT services will continue to follow as patient may need additional assistance at home for higher level tasks.     12/15/20 1400  OT Visit Information  Last OT Received On 12/15/20  Assistance Needed +1  History of Present Illness 70 y.o. female with remarkably poor medical follow-up, states she has been seeing an acupuncturist for some years but has not seen a physician/NP/PA (primary care or otherwise) in "some time" without clear delineation. Pt admitted for acute onset fatigue, dyspnea with exertion, profound ambulatory dysfunction POA, and found to have Symptomatic anemia  Precautions  Precautions Fall  Restrictions  Weight Bearing Restrictions No  Home Living  Family/patient expects to be discharged to: Private residence  Living Arrangements Alone  Available Help at Discharge Family;Available PRN/intermittently  Type of Home House  Home Access Level entry  Home Layout One level  Bathroom Shower/Tub  Walk-in Patent examiner - 2 wheels;Cane - single point;Wheelchair - manual;BSC  Additional Comments friends help with groceries and cleaning per pt  Prior Function  Level of Independence Independent with assistive device(s)  Comments does not do the stairs; uses w/c for community; [place] or RW in home; does not use shower - states she is worried about "breathing" so she performs "bird bathes"  Communication  Communication No difficulties  Pain Assessment  Pain Assessment Faces  Faces Pain Scale 2  Pain Location migraine  Pain Descriptors / Indicators Headache  Pain Intervention(s) Monitored during session  Cognition  Arousal/Alertness Awake/alert  Behavior During Therapy WFL for tasks assessed/performed  Overall Cognitive Status No family/caregiver present to determine baseline cognitive functioning  General Comments patient has poor short term memory scored 6 errors on short blessed test relating to memory and sequencing.  Upper Extremity Assessment  Upper Extremity Assessment Overall WFL for tasks assessed  Lower Extremity Assessment  Lower Extremity Assessment Defer to PT evaluation  ADL  Overall ADL's  Needs assistance/impaired  Grooming Supervision/safety;Wash/dry hands;Standing  Grooming Details (indicate cue type and reason) patient needs cue for problem solving how to get soap from dispenser  Upper Body Bathing Supervision/ safety;Set up;Sitting  Lower Body Bathing Minimal assistance;Sit to/from stand;Sitting/lateral leans  Upper Body Dressing  Set up;Sitting;Supervision/safety  Lower Body Dressing Minimal assistance;Sitting/lateral leans;Sit to/from Environmental education officer guard;Ambulation;RW;Regular Toilet;Grab bars  Toilet Transfer Details (indicate cue type and reason) min G for safety  Toileting- Clothing Manipulation and Hygiene Supervision/safety;Sitting/lateral lean;Sit to/from stand  Functional mobility during ADLs Min  guard;Cueing for safety;Cueing for sequencing;Rolling walker  General ADL Comments patient requiring increased assistance  with self care tasks due to decreased safety awareness, cognition, activity tolerance  Bed Mobility  Overal bed mobility Modified Independent  General bed mobility comments HOB elevated  Transfers  Overall transfer level Needs assistance  Equipment used Rolling walker (2 wheeled)  Transfers Sit to/from Stand  Sit to Stand Min guard  General transfer comment min G for safety  Balance  Overall balance assessment Needs assistance  Sitting-balance support Feet supported  Sitting balance-Leahy Scale Good  Standing balance support No upper extremity supported  Standing balance-Leahy Scale Fair  Standing balance comment static stand at sink patient can wash hands without UE support  OT - End of Session  Equipment Utilized During Treatment Rolling walker  Activity Tolerance Patient tolerated treatment well  Patient left in chair;with call bell/phone within reach;with nursing/sitter in room;with family/visitor present  Nurse Communication Mobility status  OT Assessment  OT Recommendation/Assessment Patient needs continued OT Services  OT Visit Diagnosis Other symptoms and signs involving cognitive function;Other abnormalities of gait and mobility (R26.89)  OT Problem List Decreased activity tolerance;Decreased safety awareness;Decreased cognition;Obesity  OT Plan  OT Frequency (ACUTE ONLY) Min 2X/week  OT Treatment/Interventions (ACUTE ONLY) Self-care/ADL training;Therapeutic activities;Cognitive remediation/compensation;Patient/family education;Balance training  AM-PAC OT "6 Clicks" Daily Activity Outcome Measure (Version 2)  Help from another person eating meals? 4  Help from another person taking care of personal grooming? 3  Help from another person toileting, which includes using toliet, bedpan, or urinal? 3  Help from another person bathing (including washing,  rinsing, drying)? 3  Help from another person to put on and taking off regular upper body clothing? 3  Help from another person to put on and taking off regular lower body clothing? 3  6 Click Score 19  OT Recommendation  Recommendations for Other Services Speech consult (speech relating to cognition)  Follow Up Recommendations Home health OT  OT Equipment None recommended by OT  Individuals Consulted  Consulted and Agree with Results and Recommendations Patient  Acute Rehab OT Goals  Patient Stated Goal eat lunch  OT Goal Formulation With patient  Time For Goal Achievement 12/29/20  Potential to Achieve Goals Good  OT Time Calculation  OT Start Time (ACUTE ONLY) 1241  OT Stop Time (ACUTE ONLY) 1304  OT Time Calculation (min) 23 min  OT General Charges  $OT Visit 1 Visit  OT Evaluation  $OT Eval Low Complexity 1 Low  OT Treatments  $Self Care/Home Management  8-22 mins  Written Expression  Dominant Hand Right   Delbert Phenix OT OT pager: [phone]

## 2020-12-16 DIAGNOSIS — N858 Other specified noninflammatory disorders of uterus: Secondary | ICD-10-CM

## 2020-12-16 DIAGNOSIS — N950 Postmenopausal bleeding: Secondary | ICD-10-CM

## 2020-12-16 DIAGNOSIS — I313 Pericardial effusion (noninflammatory): Secondary | ICD-10-CM

## 2020-12-16 LAB — CBC
HCT: 26.8 % — ABNORMAL LOW (ref 36.0–46.0)
Hemoglobin: 7.6 g/dL — ABNORMAL LOW (ref 12.0–15.0)
MCH: 21.6 pg — ABNORMAL LOW (ref 26.0–34.0)
MCHC: 28.4 g/dL — ABNORMAL LOW (ref 30.0–36.0)
MCV: 76.1 fL — ABNORMAL LOW (ref 80.0–100.0)
Platelets: 180 10*3/uL (ref 150–400)
RBC: 3.52 MIL/uL — ABNORMAL LOW (ref 3.87–5.11)
RDW: 26.1 % — ABNORMAL HIGH (ref 11.5–15.5)
WBC: 6.9 10*3/uL (ref 4.0–10.5)
nRBC: 0 % (ref 0.0–0.2)

## 2020-12-16 LAB — COMPREHENSIVE METABOLIC PANEL
ALT: 14 U/L (ref 0–44)
AST: 18 U/L (ref 15–41)
Albumin: 3.5 g/dL (ref 3.5–5.0)
Alkaline Phosphatase: 55 U/L (ref 38–126)
Anion gap: 10 (ref 5–15)
BUN: 15 mg/dL (ref 8–23)
CO2: 25 mmol/L (ref 22–32)
Calcium: 8.7 mg/dL — ABNORMAL LOW (ref 8.9–10.3)
Chloride: 102 mmol/L (ref 98–111)
Creatinine, Ser: 1.17 mg/dL — ABNORMAL HIGH (ref 0.44–1.00)
GFR, Estimated: 51 mL/min — ABNORMAL LOW (ref >60)
Glucose, Bld: 90 mg/dL (ref 70–99)
Potassium: 3.5 mmol/L (ref 3.5–5.1)
Sodium: 137 mmol/L (ref 135–145)
Total Bilirubin: 1 mg/dL (ref 0.3–1.2)
Total Protein: 6.8 g/dL (ref 6.5–8.1)

## 2020-12-16 MED ORDER — FUROSEMIDE 10 MG/ML IJ SOLN
40.0000 mg | Freq: Two times a day (BID) | INTRAMUSCULAR | Status: DC
  Administered 2020-12-16 (×2): 40 mg via INTRAVENOUS
  Filled 2020-12-16 (×2): qty 4

## 2020-12-16 MED ORDER — FUROSEMIDE 10 MG/ML IJ SOLN: 40.0000 mg | Freq: Two times a day (BID) | INTRAMUSCULAR | Status: DC

## 2020-12-16 NOTE — Care Management Important Message (Signed)
Important Message  Patient Details IM Letter given to the Patient. Name: Cynthia Hill MRN: [id number] Date of Birth: [birth year]   Medicare Important Message Given:  Yes     Kerin Salen 12/16/2020, 12:38 PM

## 2020-12-16 NOTE — Progress Notes (Signed)
PROGRESS NOTE  Cynthia Hill QVZ:[id number] DOB: [birth year]   PCP: Pcp, No  Patient is from: Home.  Lives alone.  Uses walker at baseline.  DOA: 12/12/2020 LOS: 3  Chief complaints: Fatigue, shortness of breath, leg swelling  Brief Narrative / Interim history: 70 year old F with unknown PMH as she doesn't follow up with provider presenting with progressive fatigue and dyspnea that has acutely gotten worse about 24 to 48 hours prior to admission.  Patient does not take medication saying "she doesn't get along with conventional medicine".  Instead, she follows up with "[place] doctor" and uses herbal remedy.  On admission, Hgb 5.3 (unknown baseline).  FOBT negative.  K2.7.  2+ pitting edema in BLE.  Transfused 2 units with appropriate response.  Started on IV Lasix.  TTE with with LVEF of 60 to 65%, G2-DD and moderate pericardial effusion.  On further questioning, she reports intermittent vaginal bleeding where she passes blood clot.  She says she has history of "fibroids" had evaluation in [place]. TVUS showed echogenic structure in the uterus measuring 6 x 5 x 7.2 cm.  GYN consulted.  Subjective: Seen and examined earlier this morning. She is worried that she could get worse while in the hospital although she reports improvement in his symptoms.  She appears very anxious.  She lies to get out of the bed and move.  She denies chest pain. She reports ongoing scant vaginal bleed.  She denies fresh red blood or dark tarry stool.  Objective: Vitals:   12/15/20 1437 12/15/20 2204 12/16/20 0705 12/16/20 1339  BP: (!) 166/68 (!) 158/68 (!) 184/82 (!) 165/78  Pulse: 78 80 77 79  Resp: $Remo'20 17 18 15  'frCVq$ Temp: 98.3 F (36.8 C) 98.1 F (36.7 C) 97.7 F (36.5 C) 98.2 F (36.8 C)  TempSrc: Oral Oral Oral Oral  SpO2: 93% 96% 95% 100%  Weight:      Height:        Intake/Output Summary (Last 24 hours) at 12/16/2020 1437 Last data filed at 12/16/2020 1048 Gross per 24 hour  Intake 480 ml  Output  1400 ml  Net -920 ml   Filed Weights   12/12/20 2030 12/14/20 0651 12/14/20 0723  Weight: 97.4 kg 98.2 kg 95.1 kg    Examination:  GENERAL: No apparent distress.  Nontoxic. HEENT: MMM.  Vision and hearing grossly intact.  NECK: Supple.  No apparent JVD.  RESP: On RA.  No IWOB.  Fair aeration bilaterally. CVS:  RRR. Heart sounds normal.  ABD/GI/GU: BS+. Abd soft, NTND.  MSK/EXT:  Moves extremities.  1+ pitting edema with chronic venous insufficiency SKIN: Skin changes in BLE consistent with chronic venous insufficiency NEURO: Awake and alert.  Fairly oriented but poor insight.  No apparent focal neuro deficit. PSYCH: Appears anxious  Procedures:  None  Microbiology summarized: [id number] and influenza PCR nonreactive. Blood cultures NGTD.  Assessment & Plan: Acute on chronic blood loss symptomatic anemia in the setting of intermittent vaginal bleed Iron deficiency anemia: Anemia panel with severe iron deficiency Uterine mass-TVUS revealed uterine mass measuring 6 x 5 x 7.2 cm. Recent Labs    12/12/20 1710 12/13/20 1025 12/14/20 0810 12/15/20 0425 12/16/20 0450  HGB 5.3* 7.9* 7.9* 7.2* 7.6*  -FOBT negative. -H&H stable after 2 units -IV Feraheme x1 on 3/16.  Needs repeat IV iron after 3 days -Continue p.o. ferrous sulfate as well -GYN consulted about uterine mass  Acute diastolic CHF: TTE with LVEF of 60 to 65%, G2-DD.  Has significant BLE  edema with some underlying chronic venous insufficiency.  Breathing improved.  Had about 1.5 L UOP/24 hours with 4 unmeasured voids.  Renal function relatively stable.  SBP slightly elevated. -Increase IV Lasix to 40 mg twice daily -Discontinued amlodipine given BLE edema -Monitor fluid status, renal functions and electrolytes  Moderate pericardial effusion: Due to CHF? -Check uric acid, coxsackie antibodies, CRP and ESR in the morning -Diuretics as above. -May need repeat limited TTE prior to discharge  Acute respiratory failure  with hypoxia-likely due to anemia and CHF.  Resolved.  Hypertensive urgency-improved.  SBP in 160s. -IV Lasix as above -As needed hydralazine  Slightly elevated troponin likely demand ischemia in the setting of the above.  No RWMA on TTE  Hypokalemia: K3.5 -K-Dur 40 mEq x 1 -Recheck in the morning  Debility/generalized weakness/fatigue-likely due to anemia and CHF. Lives alone.  Uses walker at baseline. -PT/OT  Morbid obesity Body mass index is 40.95 kg/m.  -Encourage lifestyle change to lose weight.       DVT prophylaxis:  SCDs Start: 12/12/20 1848  Code Status: Full code Family Communication: Updated patient's brother at bedside on 3/16. Level of care: Telemetry Status is: Inpatient  Remains inpatient appropriate because:Persistent severe electrolyte disturbances, Ongoing diagnostic testing needed not appropriate for outpatient work up, IV treatments appropriate due to intensity of illness or inability to take PO and Inpatient level of care appropriate due to severity of illness   Dispo: The patient is from: Home              Anticipated d/c is to: Home              Patient currently is not medically stable to d/c.   Difficult to place patient No       Consultants:  Gynecology   Sch Meds:  Scheduled Meds: . furosemide  40 mg Intravenous BID  . mouth rinse  15 mL Mouth Rinse BID  . sodium chloride flush  3 mL Intravenous Q12H   Continuous Infusions:  PRN Meds:.acetaminophen **OR** acetaminophen, hydrALAZINE, polyethylene glycol  Antimicrobials: Anti-infectives (From admission, onward)   None       I have personally reviewed the following labs and images: CBC: Recent Labs  Lab 12/12/20 1710 12/13/20 1025 12/14/20 0810 12/15/20 0425 12/16/20 0450  WBC 10.2 9.4 7.6 7.8 6.9  NEUTROABS 8.5*  --   --   --   --   HGB 5.3* 7.9* 7.9* 7.2* 7.6*  HCT 20.9* 27.4* 28.1* 25.3* 26.8*  MCV 69.4* 74.3* 73.9* 74.2* 76.1*  PLT 203 199 210 191 180   BMP  &GFR Recent Labs  Lab 12/12/20 1710 12/12/20 1827 12/13/20 1025 12/14/20 0810 12/15/20 0425 12/16/20 0450  NA 136  --  138 137 139 137  K 2.7*  --  2.8* 3.0* 3.3* 3.5  CL 103  --  102 99 103 102  CO2 23  --  $R'24 26 26 25  'lO$ GLUCOSE 117*  --  109* 95 96 90  BUN 11  --  $R'12 15 16 15  'Kj$ CREATININE 1.01*  --  1.04* 1.30* 1.15* 1.17*  CALCIUM 9.1  --  9.2 8.8* 8.9 8.7*  MG  --  1.6*  --   --   --   --    Estimated Creatinine Clearance: 47.4 mL/min (A) (by C-G formula based on SCr of 1.17 mg/dL (H)). Liver & Pancreas: Recent Labs  Lab 12/12/20 1710 12/13/20 1025 12/14/20 0810 12/15/20 0425 12/16/20 0450  AST 20  $'22 22 19 18  'f$ ALT '13 13 14 11 14  '$ ALKPHOS 61 59 59 54 55  BILITOT 1.5* 2.2* 1.7* 1.2 1.0  PROT 7.2 7.2 7.0 6.4* 6.8  ALBUMIN 3.7 3.6 3.6 3.3* 3.5   No results for input(s): LIPASE, AMYLASE in the last 168 hours. No results for input(s): AMMONIA in the last 168 hours. Diabetic: No results for input(s): HGBA1C in the last 72 hours. No results for input(s): GLUCAP in the last 168 hours. Cardiac Enzymes: No results for input(s): CKTOTAL, CKMB, CKMBINDEX, TROPONINI in the last 168 hours. No results for input(s): PROBNP in the last 8760 hours. Coagulation Profile: Recent Labs  Lab 12/13/20 1025  INR 1.4*   Thyroid Function Tests: No results for input(s): TSH, T4TOTAL, FREET4, T3FREE, THYROIDAB in the last 72 hours. Lipid Profile: No results for input(s): CHOL, HDL, LDLCALC, TRIG, CHOLHDL, LDLDIRECT in the last 72 hours. Anemia Panel: No results for input(s): VITAMINB12, FOLATE, FERRITIN, TIBC, IRON, RETICCTPCT in the last 72 hours. Urine analysis: No results found for: COLORURINE, APPEARANCEUR, LABSPEC, PHURINE, GLUCOSEU, HGBUR, BILIRUBINUR, KETONESUR, PROTEINUR, UROBILINOGEN, NITRITE, LEUKOCYTESUR Sepsis Labs: Invalid input(s): PROCALCITONIN, [place]  Microbiology: Recent Results (from the past 240 hour(s))  Culture, blood (routine x 2)     Status: None  (Preliminary result)   Collection Time: 12/12/20  5:00 PM   Specimen: BLOOD  Result Value Ref Range Status   Specimen Description   Final    BLOOD LEFT ANTECUBITAL Performed at [facility] [address], [place], [place] [address]    Special Requests   Final    BOTTLES DRAWN AEROBIC AND ANAEROBIC Blood Culture adequate volume Performed at [facility] [address], [place], [place] [address]    Culture   Final    NO GROWTH 3 DAYS Performed at [facility], [place] [address] [place], [place] [address]    Report Status PENDING  Incomplete  Culture, blood (routine x 2)     Status: None (Preliminary result)   Collection Time: 12/12/20  5:10 PM   Specimen: BLOOD  Result Value Ref Range Status   Specimen Description   Final    BLOOD RIGHT ANTECUBITAL Performed at [facility] [address] [place], [place] [address]    Special Requests   Final    BOTTLES DRAWN AEROBIC AND ANAEROBIC Blood Culture adequate volume Performed at [facility] [address] [place], [place] [address]    Culture   Final    NO GROWTH 3 DAYS Performed at [facility], [place] [address] [place], [place] [address]    Report Status PENDING  Incomplete  Resp Panel by RT-PCR (Flu A&B, Covid) Nasopharyngeal Swab     Status: None   Collection Time: 12/12/20  5:10 PM   Specimen: Nasopharyngeal Swab; Nasopharyngeal(NP) swabs in vial transport medium  Result Value Ref Range Status   SARS Coronavirus 2 by RT PCR NEGATIVE NEGATIVE Final    Comment: (NOTE) SARS-CoV-2 target nucleic acids are NOT DETECTED.  The SARS-CoV-2 RNA is generally detectable in upper respiratory specimens during the acute phase of infection. The lowest concentration of SARS-CoV-2 viral copies this assay can detect is 138 copies/mL. A negative result does not preclude SARS-Cov-2 infection and should not be used as the sole basis for treatment  or other patient management decisions. A negative result may occur with  improper specimen collection/handling, submission of specimen other than nasopharyngeal swab, presence of viral mutation(s) within  the areas targeted by this assay, and inadequate number of viral copies(<138 copies/mL). A negative result must be combined with clinical observations, patient history, and epidemiological information. The expected result is Negative.  Fact Sheet for Patients:  EntrepreneurPulse.com.au  Fact Sheet for Healthcare Providers:  IncredibleEmployment.be  This test is no t yet approved or cleared by the [place] FDA and  has been authorized for detection and/or diagnosis of SARS-CoV-2 by FDA under an Emergency Use Authorization (EUA). This EUA will remain  in effect (meaning this test can be used) for the duration of the COVID-19 declaration under Section 564(b)(1) of the Act, 21 U.S.C.section 360bbb-3(b)(1), unless the authorization is terminated  or revoked sooner.       Influenza A by PCR NEGATIVE NEGATIVE Final   Influenza B by PCR NEGATIVE NEGATIVE Final    Comment: (NOTE) The Xpert Xpress SARS-CoV-2/FLU/RSV plus assay is intended as an aid in the diagnosis of influenza from Nasopharyngeal swab specimens and should not be used as a sole basis for treatment. Nasal washings and aspirates are unacceptable for Xpert Xpress SARS-CoV-2/FLU/RSV testing.  Fact Sheet for Patients: EntrepreneurPulse.com.au  Fact Sheet for Healthcare Providers: IncredibleEmployment.be  This test is not yet approved or cleared by the [place] FDA and has been authorized for detection and/or diagnosis of SARS-CoV-2 by FDA under an Emergency Use Authorization (EUA). This EUA will remain in effect (meaning this test can be used) for the duration of the COVID-19 declaration under Section 564(b)(1) of the Act, 21 U.S.C. section  360bbb-3(b)(1), unless the authorization is terminated or revoked.  Performed at [facility], [place] [employer] [address] [place], [place] [address]     Radiology Studies: US PELVIC COMPLETE WITH TRANSVAGINAL  Result Date: 12/15/2020 CLINICAL DATA:  Postmenopausal vaginal bleeding. EXAM: TRANSABDOMINAL AND TRANSVAGINAL ULTRASOUND OF PELVIS TECHNIQUE: Both transabdominal and transvaginal ultrasound examinations of the pelvis were performed. Transabdominal technique was performed for global imaging of the pelvis including uterus, ovaries, adnexal regions, and pelvic cul-de-sac. It was necessary to proceed with endovaginal exam following the transabdominal exam to visualize the uterus, endometrium and adnexa. COMPARISON:  None FINDINGS: Uterus Measurements: 11.3 x 5.5 x 5.5 cm = volume: 179 mL. There is a rounded echogenic structure in the central uterus measuring 6.0 x 5.0 x 7.2 cm. It is unclear if this represents a myometrial or endometrial lesion. Endometrium Not well-defined. Rounded echogenic structure in the central uterus measuring 6.0 x 5.0 x 7.2 cm, clear if this represents a myometrial or endometrial lesion. Right ovary Not visualized. Left ovary Not visualized. Other findings No adnexal mass or pelvic free fluid. Examination is technically challenging and limited due to body habitus and inability to lay flat. IMPRESSION: Technically limited exam. Central rounded echogenic structure in the uterus measuring 6 x 5 x 7.2 cm, unclear if this represents a myometrial lesion such as fibroid or an endometrial lesion. This is not well characterized by ultrasound. The endometrium is otherwise obscured. Further evaluation could be considered with pelvic MRI, however patient would have to tolerate lying flat and motionless for that exam. Electronically Signed   By: Keith Rake M.D.   On: 12/15/2020 20:49      Taye T. Datto  If 7PM-7AM, please contact  night-coverage [email or web address] 12/16/2020, 2:37 PM

## 2020-12-16 NOTE — Consult Note (Signed)
OB/GYN Consult Note  Referring Provider: Dr. Wendee Beavers  Cynthia Hill is a 70 y.o.  admitted for profound anemia. OB/Gyn consulted for vaginal bleeding.   Patient reports she has occasional golf ball size clots form the vagina. They happen every few months and have been happening for a while. She is not sure how long they have been happening. No pain associated, they are dark red and golf ball sized. Occasionally has two at once but they do not happen frequently. Has not seen gynecologist for this issue. Reports she had some bright red bleeding overnight and this am but that it is only because she had a painful vaginal exam yesterday.  Reports she went through menopause age 56, has not seen a gynecologist in quite some time, unsure how long. Reports she has told other doctors about her bleeding but nobody else has been concerned about it.      Past Medical History:  Diagnosis Date  . Migraine     History reviewed. No pertinent surgical history.  OB History  No obstetric history on file.    Social History   Socioeconomic History  . Marital status: Single    Spouse name: Not on file  . Number of children: Not on file  . Years of education: Not on file  . Highest education level: Not on file  Occupational History  . Not on file  Tobacco Use  . Smoking status: Never Smoker  . Smokeless tobacco: Never Used  Substance and Sexual Activity  . Alcohol use: Never  . Drug use: Never  . Sexual activity: Not on file  Other Topics Concern  . Not on file  Social History Narrative  . Not on file   Social Determinants of Health   Financial Resource Strain: Not on file  Food Insecurity: Not on file  Transportation Needs: Not on file  Physical Activity: Not on file  Stress: Not on file  Social Connections: Not on file    Family History  Problem Relation Age of Onset  . Heart disease Mother   . Blindness Mother   . Heart disease Father     Medications Prior to  Admission  Medication Sig Dispense Refill Last Dose  . OVER THE COUNTER MEDICATION Take 1 tablet by mouth daily. Liu jun si tang (supplement for respitory, asthma, uterine fibroid, and anemia)   Past Week at Unknown time  . OVER THE COUNTER MEDICATION Take 1 tablet by mouth daily. Er then tang (supplement for respitory, asthma, uterine fibroid, and anemia)   Past Week at Unknown time    Allergies  Allergen Reactions  . Aspirin Other (See Comments)    unknown  . Sulfa Antibiotics Other (See Comments)    unknown  . Albuterol Other (See Comments)    Worsens asthma  . Atenolol Other (See Comments)    unknown  . Bactroban [Mupirocin] Swelling  . Benzocaine Other (See Comments)    Throat swelling  . Bumex [Bumetanide] Other (See Comments)    Throat swelling  . Lasix [Furosemide] Other (See Comments)    Speeded heart rate, trouble swallowing  . Levaquin [Levofloxacin] Other (See Comments)    unknown  . Morphine And Related Other (See Comments)    unknown  . Other Other (See Comments)    Sea food, shell fish  . Propranolol Other (See Comments)    unknown  . Ptu [Propylthiouracil] Other (See Comments)    Doubles heart rate  . Spironolactone Other (See Comments)  Throat swelling  . Tapazole [Methimazole] Other (See Comments)    Irregular heartbeat  . Tetracyclines & Related Other (See Comments)    Throat swelling  . Toprol Xl [Metoprolol] Other (See Comments)    unknown  . Triamterene Other (See Comments)    unknown  . Valium [Diazepam] Other (See Comments)    unknown    Review of Systems: Negative except for what is mentioned in HPI.     Physical Exam: exam limited by patient sitting in chair BP (!) 165/78 (BP Location: Right Arm)   Pulse 79   Temp 98.2 F (36.8 C) (Oral)   Resp 15   Ht 5' (1.524 m)   Wt 95.1 kg   SpO2 100%   BMI 40.95 kg/m  CONSTITUTIONAL: sleepy, Well-developed, well-nourished female in no acute distress.  HENT:  Normocephalic, atraumatic,  External right and left ear normal. Oropharynx is clear and moist EYES: Conjunctivae and EOM are normal. Pupils are equal, round, and reactive to light. No scleral icterus.  NECK: Normal range of motion, supple, no masses SKIN: Skin is warm and dry. No rash noted. Not diaphoretic. No erythema. No pallor. [place]: Pt sleeping on arrival, slow in answering questions, taking time to think about them but sure of answers, Alert and oriented to person, place, and time.  PSYCHIATRIC: Normal mood and affect. Normal behavior. Normal judgment and thought content. CARDIOVASCULAR: Normal heart rate noted RESPIRATORY: Effort slightly labored ABDOMEN:  distended. PELVIC: deferred per patient MUSCULOSKELETAL: decreased range of motion.  Pertinent Labs/Studies:   Results for orders placed or performed during the hospital encounter of 12/12/20 (from the past 72 hour(s))  CBC     Status: Abnormal   Collection Time: 12/14/20  8:10 AM  Result Value Ref Range   WBC 7.6 4.0 - 10.5 K/uL   RBC 3.80 (L) 3.87 - 5.11 MIL/uL   Hemoglobin 7.9 (L) 12.0 - 15.0 g/dL    Comment: Reticulocyte Hemoglobin testing may be clinically indicated, consider ordering this additional test [id number]    HCT 28.1 (L) 36.0 - 46.0 %   MCV 73.9 (L) 80.0 - 100.0 fL   MCH 20.8 (L) 26.0 - 34.0 pg   MCHC 28.1 (L) 30.0 - 36.0 g/dL   RDW 24.5 (H) 11.5 - 15.5 %   Platelets 210 150 - 400 K/uL   nRBC 0.0 0.0 - 0.2 %    Comment: Performed at [facility], [place] [address], [place], [place] [address]  Comprehensive metabolic panel     Status: Abnormal   Collection Time: 12/14/20  8:10 AM  Result Value Ref Range   Sodium 137 135 - 145 mmol/L   Potassium 3.0 (L) 3.5 - 5.1 mmol/L   Chloride 99 98 - 111 mmol/L   CO2 26 22 - 32 mmol/L   Glucose, Bld 95 70 - 99 mg/dL    Comment: Glucose reference range applies only to samples taken after fasting for at least 8 hours.   BUN 15 8 - 23 mg/dL   Creatinine, Ser 1.30 (H) 0.44 -  1.00 mg/dL   Calcium 8.8 (L) 8.9 - 10.3 mg/dL   Total Protein 7.0 6.5 - 8.1 g/dL   Albumin 3.6 3.5 - 5.0 g/dL   AST 22 15 - 41 U/L   ALT 14 0 - 44 U/L   Alkaline Phosphatase 59 38 - 126 U/L   Total Bilirubin 1.7 (H) 0.3 - 1.2 mg/dL   GFR, Estimated 45 (L) >60 mL/min    Comment: (  NOTE) Calculated using the CKD-EPI Creatinine Equation (2021)    Anion gap 12 5 - 15    Comment: Performed at [facility], [place] [address], [place], [place] [address]  CBC     Status: Abnormal   Collection Time: 12/15/20  4:25 AM  Result Value Ref Range   WBC 7.8 4.0 - 10.5 K/uL   RBC 3.41 (L) 3.87 - 5.11 MIL/uL   Hemoglobin 7.2 (L) 12.0 - 15.0 g/dL    Comment: Reticulocyte Hemoglobin testing may be clinically indicated, consider ordering this additional test [id number]    HCT 25.3 (L) 36.0 - 46.0 %   MCV 74.2 (L) 80.0 - 100.0 fL   MCH 21.1 (L) 26.0 - 34.0 pg   MCHC 28.5 (L) 30.0 - 36.0 g/dL   RDW 24.9 (H) 11.5 - 15.5 %   Platelets 191 150 - 400 K/uL   nRBC 0.0 0.0 - 0.2 %    Comment: Performed at [facility], [place] [address], [place], [place] [address]  Comprehensive metabolic panel     Status: Abnormal   Collection Time: 12/15/20  4:25 AM  Result Value Ref Range   Sodium 139 135 - 145 mmol/L   Potassium 3.3 (L) 3.5 - 5.1 mmol/L   Chloride 103 98 - 111 mmol/L   CO2 26 22 - 32 mmol/L   Glucose, Bld 96 70 - 99 mg/dL    Comment: Glucose reference range applies only to samples taken after fasting for at least 8 hours.   BUN 16 8 - 23 mg/dL   Creatinine, Ser 1.15 (H) 0.44 - 1.00 mg/dL   Calcium 8.9 8.9 - 10.3 mg/dL   Total Protein 6.4 (L) 6.5 - 8.1 g/dL   Albumin 3.3 (L) 3.5 - 5.0 g/dL   AST 19 15 - 41 U/L   ALT 11 0 - 44 U/L   Alkaline Phosphatase 54 38 - 126 U/L   Total Bilirubin 1.2 0.3 - 1.2 mg/dL   GFR, Estimated 52 (L) >60 mL/min    Comment: (NOTE) Calculated using the CKD-EPI Creatinine Equation (2021)    Anion gap 10 5 - 15    Comment: Performed  at [facility], [place] [address], [place], [place] [address]  CBC     Status: Abnormal   Collection Time: 12/16/20  4:50 AM  Result Value Ref Range   WBC 6.9 4.0 - 10.5 K/uL   RBC 3.52 (L) 3.87 - 5.11 MIL/uL   Hemoglobin 7.6 (L) 12.0 - 15.0 g/dL    Comment: Reticulocyte Hemoglobin testing may be clinically indicated, consider ordering this additional test [id number]    HCT 26.8 (L) 36.0 - 46.0 %   MCV 76.1 (L) 80.0 - 100.0 fL   MCH 21.6 (L) 26.0 - 34.0 pg   MCHC 28.4 (L) 30.0 - 36.0 g/dL   RDW 26.1 (H) 11.5 - 15.5 %   Platelets 180 150 - 400 K/uL   nRBC 0.0 0.0 - 0.2 %    Comment: Performed at [facility], [place] [address] [place], [place] [address]  Comprehensive metabolic panel     Status: Abnormal   Collection Time: 12/16/20  4:50 AM  Result Value Ref Range   Sodium 137 135 - 145 mmol/L   Potassium 3.5 3.5 - 5.1 mmol/L   Chloride 102 98 - 111 mmol/L   CO2 25 22 - 32 mmol/L   Glucose, Bld 90 70 - 99 mg/dL    Comment: Glucose reference  range applies only to samples taken after fasting for at least 8 hours.   BUN 15 8 - 23 mg/dL   Creatinine, Ser 1.17 (H) 0.44 - 1.00 mg/dL   Calcium 8.7 (L) 8.9 - 10.3 mg/dL   Total Protein 6.8 6.5 - 8.1 g/dL   Albumin 3.5 3.5 - 5.0 g/dL   AST 18 15 - 41 U/L   ALT 14 0 - 44 U/L   Alkaline Phosphatase 55 38 - 126 U/L   Total Bilirubin 1.0 0.3 - 1.2 mg/dL   GFR, Estimated 51 (L) >60 mL/min    Comment: (NOTE) Calculated using the CKD-EPI Creatinine Equation (2021)    Anion gap 10 5 - 15    Comment: Performed at [facility], [place] [address] [place], [place] [address]       Assessment and Plan :Cynthia Hill is a 70 y.o. admitted for severe anemia, with several other issues. She reports infrequent and small amounts of bleeding that has been going on for a long time. She declines a pelvic exam. Given the amount of bleeding she reports, I do not suspect that her anemia is caused by her  post menopausal bleeding.   I did discuss the ultrasound findings with her, that it is unclear if this is a fibroid versus polyp versus malignancy. Recommend she follow up in the office as an outpatient for endometrial biopsy and pap smear. She verbalizes understanding and is in agreement with plan for follow up as an outpatient.   CHF/HTN per primary team.   Thank you for this consult, we will sign off, please call or re-consult with further questions.   For OB/GYN questions, please call the [facility] [place] Monday - Friday, 8 am - 5 pm: [phone] All other times: [phone]    K. Arvilla Meres, M.D. Attending [place], [facility] for [employer], [place]

## 2020-12-17 ENCOUNTER — Encounter (HOSPITAL_COMMUNITY): Payer: Self-pay | Admitting: Internal Medicine

## 2020-12-17 DIAGNOSIS — R7989 Other specified abnormal findings of blood chemistry: Secondary | ICD-10-CM

## 2020-12-17 LAB — SEDIMENTATION RATE: Sed Rate: 30 mm/hr — ABNORMAL HIGH (ref 0–22)

## 2020-12-17 LAB — RENAL FUNCTION PANEL
Albumin: 3.7 g/dL (ref 3.5–5.0)
Anion gap: 12 (ref 5–15)
BUN: 16 mg/dL (ref 8–23)
CO2: 27 mmol/L (ref 22–32)
Calcium: 9.4 mg/dL (ref 8.9–10.3)
Chloride: 99 mmol/L (ref 98–111)
Creatinine, Ser: 1.23 mg/dL — ABNORMAL HIGH (ref 0.44–1.00)
GFR, Estimated: 48 mL/min — ABNORMAL LOW (ref >60)
Glucose, Bld: 122 mg/dL — ABNORMAL HIGH (ref 70–99)
Phosphorus: 3.3 mg/dL (ref 2.5–4.6)
Potassium: 2.9 mmol/L — ABNORMAL LOW (ref 3.5–5.1)
Sodium: 138 mmol/L (ref 135–145)

## 2020-12-17 LAB — MAGNESIUM: Magnesium: 1.6 mg/dL — ABNORMAL LOW (ref 1.7–2.4)

## 2020-12-17 LAB — URIC ACID: Uric Acid, Serum: 7.9 mg/dL — ABNORMAL HIGH (ref 2.5–7.1)

## 2020-12-17 LAB — CBC
HCT: 26.9 % — ABNORMAL LOW (ref 36.0–46.0)
Hemoglobin: 7.6 g/dL — ABNORMAL LOW (ref 12.0–15.0)
MCH: 21.7 pg — ABNORMAL LOW (ref 26.0–34.0)
MCHC: 28.3 g/dL — ABNORMAL LOW (ref 30.0–36.0)
MCV: 76.9 fL — ABNORMAL LOW (ref 80.0–100.0)
Platelets: 170 10*3/uL (ref 150–400)
RBC: 3.5 MIL/uL — ABNORMAL LOW (ref 3.87–5.11)
RDW: 26.7 % — ABNORMAL HIGH (ref 11.5–15.5)
WBC: 6.8 10*3/uL (ref 4.0–10.5)
nRBC: 0 % (ref 0.0–0.2)

## 2020-12-17 LAB — C-REACTIVE PROTEIN: CRP: 1.4 mg/dL — ABNORMAL HIGH (ref <1.0)

## 2020-12-17 LAB — TSH: TSH: 16.301 u[IU]/mL — ABNORMAL HIGH (ref 0.350–4.500)

## 2020-12-17 LAB — T4, FREE: Free T4: 0.44 ng/dL — ABNORMAL LOW (ref 0.61–1.12)

## 2020-12-17 MED ORDER — COLCHICINE 0.6 MG PO TABS
1.2000 mg | ORAL_TABLET | Freq: Once | ORAL | Status: DC
  Filled 2020-12-17: qty 2

## 2020-12-17 MED ORDER — MAGNESIUM SULFATE 2 GM/50ML IV SOLN
2.0000 g | Freq: Once | INTRAVENOUS | Status: AC
  Administered 2020-12-17: 2 g via INTRAVENOUS
  Filled 2020-12-17: qty 50

## 2020-12-17 MED ORDER — POTASSIUM CHLORIDE 20 MEQ PO PACK: 40.0000 meq | PACK | ORAL | Status: DC

## 2020-12-17 MED ORDER — COLCHICINE 0.6 MG PO TABS
0.6000 mg | ORAL_TABLET | Freq: Every day | ORAL | Status: DC
  Administered 2020-12-18: 0.6 mg via ORAL
  Filled 2020-12-17: qty 1

## 2020-12-17 MED ORDER — FUROSEMIDE 10 MG/ML IJ SOLN
20.0000 mg | Freq: Every day | INTRAMUSCULAR | Status: DC
  Administered 2020-12-18: 20 mg via INTRAVENOUS
  Filled 2020-12-17: qty 2

## 2020-12-17 MED ORDER — LEVOTHYROXINE SODIUM 25 MCG PO TABS
25.0000 ug | ORAL_TABLET | Freq: Every day | ORAL | Status: DC
  Administered 2020-12-18: 25 ug via ORAL
  Filled 2020-12-17: qty 1

## 2020-12-17 MED ORDER — FUROSEMIDE 10 MG/ML IJ SOLN
20.0000 mg | Freq: Two times a day (BID) | INTRAMUSCULAR | Status: DC
  Administered 2020-12-17: 20 mg via INTRAVENOUS
  Filled 2020-12-17: qty 2

## 2020-12-17 MED ORDER — POTASSIUM CHLORIDE CRYS ER 20 MEQ PO TBCR: 40.0000 meq | EXTENDED_RELEASE_TABLET | ORAL | Status: DC

## 2020-12-17 MED ORDER — POTASSIUM CHLORIDE CRYS ER 20 MEQ PO TBCR
40.0000 meq | EXTENDED_RELEASE_TABLET | ORAL | Status: AC
  Administered 2020-12-17 (×2): 40 meq via ORAL
  Filled 2020-12-17 (×2): qty 2

## 2020-12-17 MED ORDER — POTASSIUM CHLORIDE 20 MEQ PO PACK
40.0000 meq | PACK | Freq: Once | ORAL | Status: AC
  Administered 2020-12-17: 40 meq via ORAL
  Filled 2020-12-17: qty 2

## 2020-12-17 NOTE — Progress Notes (Addendum)
PROGRESS NOTE  Cynthia Hill EHU:[id number] DOB: 09/29/1951   PCP: Pcp, No  Patient is from: Home.  Lives alone.  Uses walker at baseline.  DOA: 12/12/2020 LOS: 4  Chief complaints: Fatigue, shortness of breath, leg swelling  Brief Narrative / Interim history: 70 year old F with unknown PMH as she doesn't follow up with provider presenting with progressive fatigue and dyspnea that has acutely gotten worse about 24 to 48 hours prior to admission.  Patient does not take medication saying "she doesn't get along with conventional medicine".  Instead, she follows up with "[place] doctor" and uses herbal remedy.  On admission, Hgb 5.3 (unknown baseline).  FOBT negative.  K2.7.  2+ pitting edema in BLE.  Transfused 2 units with appropriate response.  Started on IV Lasix.  TTE with with LVEF of 60 to 65%, G2-DD and moderate pericardial effusion.  Uric acid slightly elevated.  Started on colchicine.  Cardiology consulted.   On further questioning, she reports intermittent vaginal bleeding where she passes blood clot.  She says she has history of "fibroids" had evaluation in [place]. TVUS showed echogenic structure in the uterus measuring 6 x 5 x 7.2 cm.  GYN consulted.  She refused pelvic exam.  GYN recommended outpatient follow-up.  Subjective: Seen and examined earlier this morning.  No major events overnight of this morning.  She says she feels better and would like to go home.  She denies chest pain, shortness of breath, GI or UTI symptoms  Objective: Vitals:   12/16/20 2126 12/17/20 0500 12/17/20 0607 12/17/20 0641  BP: 140/66  (!) 151/57 (!) 155/55  Pulse: 78  69 71  Resp: $Remo'13  16 18  'BuyTo$ Temp: 98.1 F (36.7 C)  98.2 F (36.8 C) 98.3 F (36.8 C)  TempSrc: Oral   Oral  SpO2: 96%  95% 94%  Weight:  92.8 kg    Height:        Intake/Output Summary (Last 24 hours) at 12/17/2020 1410 Last data filed at 12/17/2020 1246 Gross per 24 hour  Intake 530 ml  Output 800 ml  Net -270 ml   Filed  Weights   12/14/20 0651 12/14/20 0723 12/17/20 0500  Weight: 98.2 kg 95.1 kg 92.8 kg    Examination:  GENERAL: No apparent distress.  Nontoxic. HEENT: MMM.  Vision and hearing grossly intact.  NECK: Supple.  No apparent JVD.  RESP: On RA.  No IWOB.  Fair aeration bilaterally. CVS:  RRR. Heart sounds normal.  ABD/GI/GU: BS+. Abd soft, NTND.  MSK/EXT:  Moves extremities.  1+ pitting edema with chronic venous insufficiency. SKIN: no apparent skin lesion or wound NEURO: Awake, alert and oriented appropriately.  No apparent focal neuro deficit. PSYCH: Calm. Normal affect.   Procedures:  None  Microbiology summarized: [id number] and influenza PCR nonreactive. Blood cultures NGTD.  Assessment & Plan: Acute on chronic blood loss symptomatic anemia in the setting of intermittent vaginal bleed Iron deficiency anemia: Anemia panel with severe iron deficiency Uterine mass-TVUS revealed uterine mass measuring 6 x 5 x 7.2 cm. Recent Labs    12/12/20 1710 12/13/20 1025 12/14/20 0810 12/15/20 0425 12/16/20 0450 12/17/20 0433  HGB 5.3* 7.9* 7.9* 7.2* 7.6* 7.6*  -FOBT negative. -H&H stable after 2 units -IV Feraheme x1 on 3/16.  Will give additional IV iron on 3/19 -Continue p.o. ferrous sulfate as well -GYN recommended outpatient follow-up for uterine mass.  Acute diastolic CHF: TTE with LVEF of 60 to 65%, G2-DD.  Has significant BLE edema with some underlying chronic  venous insufficiency.  TSH elevated to 16.3.  Breathing improved.  I&O incomplete.  Creatinine up. SBP slightly elevated. -Decrease Lasix to 20 mg daily -Follow free T4.  May need Synthroid unless free T4 is elevated -Discontinued amlodipine given BLE edema -Monitor fluid status, renal functions and electrolytes -Encourage leg elevation  Moderate pericardial effusion: Due to CHF.  No signs of pericarditis.  Uric acid elevated.  Also mild elevation in CRP and ESR. -Start colchicine -Consulted cardiology-appreciate  input.  Acute respiratory failure with hypoxia-ruled out  Hypertensive urgency-improved.  SBP in 150s. -IV Lasix as above -As needed hydralazine  Slightly elevated troponin likely demand ischemia in the setting of the above.  No RWMA on TTE  Hypokalemia/hypomagnesemia: Due to diuretics? -Replenish and recheck  Elevated TSH: 16.3.  Could have hypothyroidism -Follow free T4 -Will initiate Synthroid unless free T4 is elevated  Debility/generalized weakness/fatigue-likely due to anemia, CHF and possible hypothyroidism. Lives alone.  Uses walker at baseline. -Treat treatable causes as above. -PT/OT recommended home health.  Morbid obesity Body mass index is 39.96 kg/m.  -Encourage lifestyle change to lose weight.       DVT prophylaxis:  SCDs Start: 12/12/20 1848  Code Status: Full code Family Communication: Updated patient's brother at bedside on 3/16. Level of care: Telemetry Status is: Inpatient  Remains inpatient appropriate because:Persistent severe electrolyte disturbances, Ongoing diagnostic testing needed not appropriate for outpatient work up, IV treatments appropriate due to intensity of illness or inability to take PO and Inpatient level of care appropriate due to severity of illness   Dispo: The patient is from: Home              Anticipated d/c is to: Home              Patient currently is not medically stable to d/c.   Difficult to place patient No       Consultants:  Gynecology Cardiology   Sch Meds:  Scheduled Meds:  [START ON 12/18/2020] colchicine  0.6 mg Oral Daily   furosemide  20 mg Intravenous BID   mouth rinse  15 mL Mouth Rinse BID   sodium chloride flush  3 mL Intravenous Q12H   Continuous Infusions:   PRN Meds:.acetaminophen **OR** acetaminophen, hydrALAZINE, polyethylene glycol  Antimicrobials: Anti-infectives (From admission, onward)    None        I have personally reviewed the following labs and images: CBC: Recent  Labs  Lab 12/12/20 1710 12/13/20 1025 12/14/20 0810 12/15/20 0425 12/16/20 0450 12/17/20 0433  WBC 10.2 9.4 7.6 7.8 6.9 6.8  NEUTROABS 8.5*  --   --   --   --   --   HGB 5.3* 7.9* 7.9* 7.2* 7.6* 7.6*  HCT 20.9* 27.4* 28.1* 25.3* 26.8* 26.9*  MCV 69.4* 74.3* 73.9* 74.2* 76.1* 76.9*  PLT 203 199 210 191 180 170   BMP &GFR Recent Labs  Lab 12/12/20 1827 12/13/20 1025 12/14/20 0810 12/15/20 0425 12/16/20 0450 12/17/20 0433  NA  --  138 137 139 137 138  K  --  2.8* 3.0* 3.3* 3.5 2.9*  CL  --  102 99 103 102 99  CO2  --  $R'24 26 26 25 27  'Ks$ GLUCOSE  --  109* 95 96 90 122*  BUN  --  $R'12 15 16 15 16  'wQ$ CREATININE  --  1.04* 1.30* 1.15* 1.17* 1.23*  CALCIUM  --  9.2 8.8* 8.9 8.7* 9.4  MG 1.6*  --   --   --   --  1.6*  PHOS  --   --   --   --   --  3.3   Estimated Creatinine Clearance: 44.5 mL/min (A) (by C-G formula based on SCr of 1.23 mg/dL (H)). Liver & Pancreas: Recent Labs  Lab 12/12/20 1710 12/13/20 1025 12/14/20 0810 12/15/20 0425 12/16/20 0450 12/17/20 0433  AST $Re'20 22 22 19 18  'mdj$ --   ALT $Re'13 13 14 11 14  'APJ$ --   ALKPHOS 61 59 59 54 55  --   BILITOT 1.5* 2.2* 1.7* 1.2 1.0  --   PROT 7.2 7.2 7.0 6.4* 6.8  --   ALBUMIN 3.7 3.6 3.6 3.3* 3.5 3.7   No results for input(s): LIPASE, AMYLASE in the last 168 hours. No results for input(s): AMMONIA in the last 168 hours. Diabetic: No results for input(s): HGBA1C in the last 72 hours. No results for input(s): GLUCAP in the last 168 hours. Cardiac Enzymes: No results for input(s): CKTOTAL, CKMB, CKMBINDEX, TROPONINI in the last 168 hours. No results for input(s): PROBNP in the last 8760 hours. Coagulation Profile: Recent Labs  Lab 12/13/20 1025  INR 1.4*   Thyroid Function Tests: Recent Labs    12/17/20 1103  TSH 16.301*   Lipid Profile: No results for input(s): CHOL, HDL, LDLCALC, TRIG, CHOLHDL, LDLDIRECT in the last 72 hours. Anemia Panel: No results for input(s): VITAMINB12, FOLATE, FERRITIN, TIBC, IRON, RETICCTPCT  in the last 72 hours. Urine analysis: No results found for: COLORURINE, APPEARANCEUR, LABSPEC, PHURINE, GLUCOSEU, HGBUR, BILIRUBINUR, KETONESUR, PROTEINUR, UROBILINOGEN, NITRITE, LEUKOCYTESUR Sepsis Labs: Invalid input(s): PROCALCITONIN, [place]  Microbiology: Recent Results (from the past 240 hour(s))  Culture, blood (routine x 2)     Status: None (Preliminary result)   Collection Time: 12/12/20  5:00 PM   Specimen: BLOOD  Result Value Ref Range Status   Specimen Description   Final    BLOOD LEFT ANTECUBITAL Performed at [facility] [address] [place], [place] [address]    Special Requests   Final    BOTTLES DRAWN AEROBIC AND ANAEROBIC Blood Culture adequate volume Performed at [facility] [address], [place], [place] [address]    Culture   Final    NO GROWTH 4 DAYS Performed at [facility], [place] [address] [place], [place] [address]    Report Status PENDING  Incomplete  Culture, blood (routine x 2)     Status: None (Preliminary result)   Collection Time: 12/12/20  5:10 PM   Specimen: BLOOD  Result Value Ref Range Status   Specimen Description   Final    BLOOD RIGHT ANTECUBITAL Performed at [facility] [address], [place], [place] [address]    Special Requests   Final    BOTTLES DRAWN AEROBIC AND ANAEROBIC Blood Culture adequate volume Performed at [facility] [address], [place], [place] [address]    Culture   Final    NO GROWTH 4 DAYS Performed at [facility], [place] [address] [place], [place] [address]    Report Status PENDING  Incomplete  Resp Panel by RT-PCR (Flu A&B, Covid) Nasopharyngeal Swab     Status: None   Collection Time: 12/12/20  5:10 PM   Specimen: Nasopharyngeal Swab; Nasopharyngeal(NP) swabs in vial transport medium  Result Value Ref Range Status   SARS Coronavirus 2 by RT PCR NEGATIVE NEGATIVE Final    Comment: (NOTE) SARS-CoV-2  target nucleic acids are NOT DETECTED.  The SARS-CoV-2 RNA  is generally detectable in upper respiratory specimens during the acute phase of infection. The lowest concentration of SARS-CoV-2 viral copies this assay can detect is 138 copies/mL. A negative result does not preclude SARS-Cov-2 infection and should not be used as the sole basis for treatment or other patient management decisions. A negative result may occur with  improper specimen collection/handling, submission of specimen other than nasopharyngeal swab, presence of viral mutation(s) within the areas targeted by this assay, and inadequate number of viral copies(<138 copies/mL). A negative result must be combined with clinical observations, patient history, and epidemiological information. The expected result is Negative.  Fact Sheet for Patients:  EntrepreneurPulse.com.au  Fact Sheet for Healthcare Providers:  IncredibleEmployment.be  This test is no t yet approved or cleared by the [place] FDA and  has been authorized for detection and/or diagnosis of SARS-CoV-2 by FDA under an Emergency Use Authorization (EUA). This EUA will remain  in effect (meaning this test can be used) for the duration of the COVID-19 declaration under Section 564(b)(1) of the Act, 21 U.S.C.section 360bbb-3(b)(1), unless the authorization is terminated  or revoked sooner.       Influenza A by PCR NEGATIVE NEGATIVE Final   Influenza B by PCR NEGATIVE NEGATIVE Final    Comment: (NOTE) The Xpert Xpress SARS-CoV-2/FLU/RSV plus assay is intended as an aid in the diagnosis of influenza from Nasopharyngeal swab specimens and should not be used as a sole basis for treatment. Nasal washings and aspirates are unacceptable for Xpert Xpress SARS-CoV-2/FLU/RSV testing.  Fact Sheet for Patients: EntrepreneurPulse.com.au  Fact Sheet for Healthcare  Providers: IncredibleEmployment.be  This test is not yet approved or cleared by the [place] FDA and has been authorized for detection and/or diagnosis of SARS-CoV-2 by FDA under an Emergency Use Authorization (EUA). This EUA will remain in effect (meaning this test can be used) for the duration of the COVID-19 declaration under Section 564(b)(1) of the Act, 21 U.S.C. section 360bbb-3(b)(1), unless the authorization is terminated or revoked.  Performed at [facility], [place] [address] [place], [place] [address]     Radiology Studies: No results found.    Avondre Richens T. [place]  If 7PM-7AM, please contact night-coverage [email or web address] 12/17/2020, 2:10 PM

## 2020-12-17 NOTE — TOC Progression Note (Signed)
Transition of Care [facility]) - Progression Note    Patient Details  Name: Cynthia Hill MRN: [id number] Date of Birth: September 18, 1951  Transition of Care [facility] CM/SW Contact  Purcell Mouton, RN Phone Number: 12/17/2020, 3:34 PM  Clinical Narrative:     Spoke with pt's brother [name] concerning discharge to home with Home Health. [facility] was selected. Referral given. However [facility] will not be able to follow pt until Monday.   Expected Discharge Plan: [place] Barriers to Discharge: No Barriers Identified  Expected Discharge Plan and Services Expected Discharge Plan: [place] arrangements for the past 2 months: Single Family Home                                       Social Determinants of Health (SDOH) Interventions    Readmission Risk Interventions No flowsheet data found.

## 2020-12-17 NOTE — Consult Note (Addendum)
Cardiology Consultation:   Patient ID: Cynthia Hill MRN: [id number]; DOB: Feb 05, 1951  Admit date: 12/12/2020 Date of Consult: 12/17/2020  PCP:  Kathyrn Lass   [facility]  Cardiologist:  Fransico Him, MD  Advanced Practice Provider:  No care team member to display Electrophysiologist:  None   Patient Profile:   Cynthia Hill is a 70 y.o. female with a hx of suspected CHF by history, HTN, migraines, remote mononucleosis with chronic pain/fatigue, obesity, episodic vaginal bleeding who is being seen today for the evaluation of CHF at the request of Dr. Cyndia Skeeters.  History of Present Illness:   Cynthia Hill does not seek traditional medical care and follows with a holistic/Chinese medicine doctor. She reports a long history of chronic fatigue and pain requiring her to be on disability for a period of time ever since having mononucleosis at age 24. She previously lived in [place] and apparently was hospitalized several times there for heart and lung failure. She recalls being told 20 years ago that she needed a heart transplant or she would die. She states she thought the doctor just wanted to do it which is why it was suggested. She remembers previously having admissions where they took anywhere from 50-70 lb off. She recalls having some sort of procedure through a vein to facilitate this but otherwise is not clear on how this was done. Recalls "a lot of testing," but no specific outcome or details.   When asked how long she has been feeling bad for, she reports this has been going on since the 1990s. She felt that when living in [place] her health continued to fail. She moved up to [place] per recommendation of her family about a year ago to be closer to her brother. She has been experiencing vaginal bleeding and clots for a long time - chronicity not totally clear. Denies having had a colonoscopy or pap in the last 10 years. She presented with profound weakness, too weak to  ambulate around her home, as well as SOB with exertion and edema. She has a difficult time saying how long the more acute symptoms have been going on, maybe a few days to a week. She denies any anginal type chest pain - states she lives with a chronic dull ache in her chest as a result of her chronic pain syndrome. She was markedly hypertensive on arrival. Workup notable for profound anemia with Hgb 5.3 (indices c/w iron deficiency), severe hypokalemia of 2.7, hypomagnesemia of 1.6, TBili 1.5, hsTroponin 18->18, BNP 502, normal lactic acid, Covid negative, FOBT negative, CRP 1.4 and ESR 30, Cr range 1-1.3 this admission. CXR c/w cardiomegaly with mild CHF. Pelvic US showed central rounded echogenic structure in the uterus measuring 6 x 5 x 7.2 cm, unclear if this represents a myometrial lesion such as fibroid or an endometrial lesion, endometrium otherwise obscured, consider pelvic MRI. 2D Echo EF 60-65%, grade 2 DD, mildly elevated PASP, moderate pericardial effusion, mild-moderate TR, mild AS.  She was started on IV Lasix at varying doses of 20-$RemoveBef'60mg'fFnhsndIui$  BID since admission, last ordered as $RemoveBe'20mg'wCxprVBob$  IV BID, management made challenging by her persistent hypokalemia being managed by her primary team. She will be receiving Mg Sulfate this AM. She also received 2 U PRBCs and Feraheme. OBGYN has seen and plans for outpatient workup. She has also been started on colchicine given her pericardial effusion. She has a multitude of prior medication allergies/intolerances - from a cardiac standpoint this includes ASA, atenolol, propanolol, Toprol, triamterene (unknown  reaction), Bumex and spironolactone (throat swelling), Lasix (speeded heart rate, trouble swallowing). She seems to be tolerating Lasix here well without signs of allergic reaction. She indicates her prior weight when feeling well is 136 but again, she is not clear on the date of this. She was peak 216 on arrival down to 204 today with habitus also c/w obesity as  well.   Past Medical History:  Diagnosis Date  . CHF (congestive heart failure) ([place])   . Chronic fatigue   . Chronic pain   . Fibroids   . HTN (hypertension)   . Migraine   . Mononucleosis     History reviewed. No pertinent surgical history.   Home Medications:  Prior to Admission medications   Medication Sig Start Date End Date Taking? Authorizing Provider  OVER THE COUNTER MEDICATION Take 1 tablet by mouth daily. Liu jun si tang (supplement for respitory, asthma, uterine fibroid, and anemia)   Yes [provider]  OVER THE COUNTER MEDICATION Take 1 tablet by mouth daily. Er then tang (supplement for respitory, asthma, uterine fibroid, and anemia)   Yes [provider]    Inpatient Medications: Scheduled Meds: . [START ON 12/18/2020] colchicine  0.6 mg Oral Daily  . furosemide  20 mg Intravenous BID  . mouth rinse  15 mL Mouth Rinse BID  . potassium chloride  40 mEq Oral Q3H  . sodium chloride flush  3 mL Intravenous Q12H   Continuous Infusions: . magnesium sulfate bolus IVPB     PRN Meds: acetaminophen **OR** acetaminophen, hydrALAZINE, polyethylene glycol  Allergies:    Allergies  Allergen Reactions  . Aspirin Other (See Comments)    unknown  . Sulfa Antibiotics Other (See Comments)    unknown  . Albuterol Other (See Comments)    Worsens asthma  . Atenolol Other (See Comments)    unknown  . Bactroban [Mupirocin] Swelling  . Benzocaine Other (See Comments)    Throat swelling  . Bumex [Bumetanide] Other (See Comments)    Throat swelling  . Lasix [Furosemide] Other (See Comments)    Speeded heart rate, trouble swallowing  . Levaquin [Levofloxacin] Other (See Comments)    unknown  . Morphine And Related Other (See Comments)    unknown  . Other Other (See Comments)    Sea food, shell fish  . Propranolol Other (See Comments)    unknown  . Ptu [Propylthiouracil] Other (See Comments)    Doubles heart rate  . Spironolactone Other (See  Comments)    Throat swelling  . Tapazole [Methimazole] Other (See Comments)    Irregular heartbeat  . Tetracyclines & Related Other (See Comments)    Throat swelling  . Toprol Xl [Metoprolol] Other (See Comments)    unknown  . Triamterene Other (See Comments)    unknown  . Valium [Diazepam] Other (See Comments)    unknown    Social History:   Social History   Socioeconomic History  . Marital status: Single    Spouse name: Not on file  . Number of children: Not on file  . Years of education: Not on file  . Highest education level: Not on file  Occupational History  . Not on file  Tobacco Use  . Smoking status: Never Smoker  . Smokeless tobacco: Never Used  Substance and Sexual Activity  . Alcohol use: Never  . Drug use: Never  . Sexual activity: Not on file  Other Topics Concern  . Not on file  Social History Narrative  .  Not on file   Social Determinants of Health   Financial Resource Strain: Not on file  Food Insecurity: Not on file  Transportation Needs: Not on file  Physical Activity: Not on file  Stress: Not on file  Social Connections: Not on file  Intimate Partner Violence: Not on file    Family History:    Family History  Problem Relation Age of Onset  . Heart disease Mother   . Blindness Mother   . Heart disease Father      ROS:  Please see the history of present illness.  All other ROS reviewed and negative.     Physical Exam/Data:   Vitals:   12/16/20 2126 12/17/20 0500 12/17/20 0607 12/17/20 0641  BP: 140/66  (!) 151/57 (!) 155/55  Pulse: 78  69 71  Resp: $Remo'13  16 18  'kgygL$ Temp: 98.1 F (36.7 C)  98.2 F (36.8 C) 98.3 F (36.8 C)  TempSrc: Oral   Oral  SpO2: 96%  95% 94%  Weight:  92.8 kg    Height:        Intake/Output Summary (Last 24 hours) at 12/17/2020 1039 Last data filed at 12/17/2020 0400 Gross per 24 hour  Intake 480 ml  Output 600 ml  Net -120 ml   Last 3 Weights 12/17/2020 12/14/2020 12/14/2020  Weight (lbs) 204 lb 9.4 oz  209 lb 11.2 oz 216 lb 7.9 oz  Weight (kg) 92.8 kg 95.119 kg 98.2 kg     Body mass index is 39.96 kg/m.  General: Well developed obese WF in no acute distress. Head: Normocephalic, atraumatic, sclera non-icteric, no xanthomas, nares are without discharge. Neck: Negative for carotid bruits. JVP not elevated. Lungs: Clear bilaterally to auscultation without wheezes, rales, or rhonchi. Breathing is unlabored. Heart: RRR S1 S2, 2/6 SEM heard across precordium without rubs or gallops.  Abdomen: Soft, non-tender, non-distended with normoactive bowel sounds. No rebound/guarding. Extremities: No clubbing or cyanosis. 1+ stiff BLE edema with chronic venous stasis changes.  Neuro: Alert and oriented X 3 but slow cadence of speech in general. Tangential at times or trailing off. Moves all extremities spontaneously. Psych: Somewhat flat affect  EKG:  The EKG was personally reviewed and demonstrates:  NSR, 82bpm, baseline wander but no apparent significant STT changes   Telemetry:  Telemetry was personally reviewed and demonstrates:  NSR rare PAC  Relevant CV Studies: 2D Echo 12/13/20  1. Left ventricular ejection fraction, by estimation, is 60 to 65%. The  left ventricle has normal function. The left ventricle has no regional  wall motion abnormalities. Left ventricular diastolic parameters are  consistent with Grade II diastolic  dysfunction (pseudonormalization).  2. Right ventricular systolic function is normal. The right ventricular  size is normal. There is mildly elevated pulmonary artery systolic  pressure.  3. Moderate pericardial effusion.  4. The mitral valve is grossly normal. Trivial mitral valve  regurgitation. No evidence of mitral stenosis.  5. Tricuspid valve regurgitation is mild to moderate.  6. The aortic valve is grossly normal. Aortic valve regurgitation is not  visualized. Mild aortic valve stenosis.   Laboratory Data:  High Sensitivity Troponin:   Recent Labs   Lab 12/12/20 1710 12/12/20 1827  TROPONINIHS 18* 18*     Chemistry Recent Labs  Lab 12/15/20 0425 12/16/20 0450 12/17/20 0433  NA 139 137 138  K 3.3* 3.5 2.9*  CL 103 102 99  CO2 $Re'26 25 27  'nFO$ GLUCOSE 96 90 122*  BUN $Re'16 15 16  'lLz$ CREATININE 1.15*  1.17* 1.23*  CALCIUM 8.9 8.7* 9.4  GFRNONAA 52* 51* 48*  ANIONGAP $RemoveB'10 10 12    'smDaXLsr$ Recent Labs  Lab 12/14/20 0810 12/15/20 0425 12/16/20 0450 12/17/20 0433  PROT 7.0 6.4* 6.8  --   ALBUMIN 3.6 3.3* 3.5 3.7  AST $Re'22 19 18  'VQs$ --   ALT $Re'14 11 14  'dgI$ --   ALKPHOS 59 54 55  --   BILITOT 1.7* 1.2 1.0  --    Hematology Recent Labs  Lab 12/15/20 0425 12/16/20 0450 12/17/20 0433  WBC 7.8 6.9 6.8  RBC 3.41* 3.52* 3.50*  HGB 7.2* 7.6* 7.6*  HCT 25.3* 26.8* 26.9*  MCV 74.2* 76.1* 76.9*  MCH 21.1* 21.6* 21.7*  MCHC 28.5* 28.4* 28.3*  RDW 24.9* 26.1* 26.7*  PLT 191 180 170   BNP Recent Labs  Lab 12/12/20 1710  BNP 502.2*    DDimer No results for input(s): DDIMER in the last 168 hours.   Radiology/Studies:  ECHOCARDIOGRAM COMPLETE  Result Date: 12/13/2020    ECHOCARDIOGRAM REPORT   Patient Name:   Cynthia Hill Date of Exam: 12/13/2020 Medical Rec #:  [id number]         Height:       60.0 in Accession #:    [id number]        Weight:       214.7 lb Date of Birth:  08-05-1951         BSA:          1.924 m Patient Age:    64 years          BP:           170/71 mmHg Patient Gender: F                 HR:           76 bpm. Exam Location:  Inpatient Procedure: 2D Echo, Cardiac Doppler and Color Doppler Indications:    Dyspnea  History:        Patient has no prior history of Echocardiogram examinations.                 Signs/Symptoms:Dyspnea; Risk Factors:Hypertension.  Sonographer:    Dustin Flock Referring Phys: [id number] [place]  Sonographer Comments: Patient is morbidly obese. Image acquisition challenging due to uncooperative patient. Patient was uncooperative IMPRESSIONS  1. Left ventricular ejection fraction, by estimation, is  60 to 65%. The left ventricle has normal function. The left ventricle has no regional wall motion abnormalities. Left ventricular diastolic parameters are consistent with Grade II diastolic dysfunction (pseudonormalization).  2. Right ventricular systolic function is normal. The right ventricular size is normal. There is mildly elevated pulmonary artery systolic pressure.  3. Moderate pericardial effusion.  4. The mitral valve is grossly normal. Trivial mitral valve regurgitation. No evidence of mitral stenosis.  5. Tricuspid valve regurgitation is mild to moderate.  6. The aortic valve is grossly normal. Aortic valve regurgitation is not visualized. Mild aortic valve stenosis. FINDINGS  Left Ventricle: Left ventricular ejection fraction, by estimation, is 60 to 65%. The left ventricle has normal function. The left ventricle has no regional wall motion abnormalities. The left ventricular internal cavity size was small. There is no left ventricular hypertrophy. Left ventricular diastolic parameters are consistent with Grade II diastolic dysfunction (pseudonormalization). Right Ventricle: The right ventricular size is normal. No increase in right ventricular wall thickness. Right ventricular systolic function is normal. There is mildly elevated pulmonary artery systolic pressure. The tricuspid  regurgitant velocity is 3.14  m/s, and with an assumed right atrial pressure of 3 mmHg, the estimated right ventricular systolic pressure is 86.7 mmHg. Left Atrium: Left atrial size was normal in size. Right Atrium: Right atrial size was normal in size. Pericardium: A moderately sized pericardial effusion is present. Mitral Valve: The mitral valve is grossly normal. Trivial mitral valve regurgitation. No evidence of mitral valve stenosis. Tricuspid Valve: The tricuspid valve is grossly normal. Tricuspid valve regurgitation is mild to moderate. Aortic Valve: The aortic valve is grossly normal. Aortic valve regurgitation is not  visualized. Mild aortic stenosis is present. Aortic valve mean gradient measures 10.0 mmHg. Aortic valve peak gradient measures 18.3 mmHg. Aortic valve area, by VTI measures 1.11 cm. Pulmonic Valve: The pulmonic valve was not well visualized. Pulmonic valve regurgitation is not visualized. Aorta: The aortic root and ascending aorta are structurally normal, with no evidence of dilitation. IAS/Shunts: The atrial septum is grossly normal.  LEFT VENTRICLE PLAX 2D LVIDd:         4.70 cm  Diastology LVIDs:         3.50 cm  LV e' medial:    6.42 cm/s LV PW:         1.20 cm  LV E/e' medial:  22.1 LV IVS:        1.20 cm  LV e' lateral:   8.59 cm/s LVOT diam:     1.70 cm  LV E/e' lateral: 16.5 LV SV:         55 LV SV Index:   29 LVOT Area:     2.27 cm  RIGHT VENTRICLE RV Basal diam:  2.50 cm RV S prime:     7.51 cm/s TAPSE (M-mode): 2.5 cm LEFT ATRIUM             Index       RIGHT ATRIUM           Index LA diam:        3.90 cm 2.03 cm/m  RA Area:     16.20 cm LA Vol (A2C):   82.6 ml 42.94 ml/m RA Volume:   40.60 ml  21.10 ml/m LA Vol (A4C):   50.8 ml 26.41 ml/m LA Biplane Vol: 64.3 ml 33.42 ml/m  AORTIC VALVE AV Area (Vmax):    1.05 cm AV Area (Vmean):   1.00 cm AV Area (VTI):     1.11 cm AV Vmax:           213.67 cm/s AV Vmean:          147.500 cm/s AV VTI:            0.496 m AV Peak Grad:      18.3 mmHg AV Mean Grad:      10.0 mmHg LVOT Vmax:         99.20 cm/s LVOT Vmean:        64.900 cm/s LVOT VTI:          0.243 m LVOT/AV VTI ratio: 0.49  AORTA Ao Root diam: 2.70 cm MITRAL VALVE                TRICUSPID VALVE MV Area (PHT): 3.91 cm     TR Peak grad:   39.4 mmHg MV Decel Time: 194 msec     TR Vmax:        314.00 cm/s MV E velocity: 142.00 cm/s MV A velocity: 107.00 cm/s  SHUNTS MV E/A ratio:  1.33  Systemic VTI:  0.24 m                             Systemic Diam: 1.70 cm Mertie Moores MD Electronically signed by Mertie Moores MD Signature Date/Time: 12/13/2020/3:43:11 PM    Final    US PELVIC COMPLETE  WITH TRANSVAGINAL  Result Date: 12/15/2020 CLINICAL DATA:  Postmenopausal vaginal bleeding. EXAM: TRANSABDOMINAL AND TRANSVAGINAL ULTRASOUND OF PELVIS TECHNIQUE: Both transabdominal and transvaginal ultrasound examinations of the pelvis were performed. Transabdominal technique was performed for global imaging of the pelvis including uterus, ovaries, adnexal regions, and pelvic cul-de-sac. It was necessary to proceed with endovaginal exam following the transabdominal exam to visualize the uterus, endometrium and adnexa. COMPARISON:  None FINDINGS: Uterus Measurements: 11.3 x 5.5 x 5.5 cm = volume: 179 mL. There is a rounded echogenic structure in the central uterus measuring 6.0 x 5.0 x 7.2 cm. It is unclear if this represents a myometrial or endometrial lesion. Endometrium Not well-defined. Rounded echogenic structure in the central uterus measuring 6.0 x 5.0 x 7.2 cm, clear if this represents a myometrial or endometrial lesion. Right ovary Not visualized. Left ovary Not visualized. Other findings No adnexal mass or pelvic free fluid. Examination is technically challenging and limited due to body habitus and inability to lay flat. IMPRESSION: Technically limited exam. Central rounded echogenic structure in the uterus measuring 6 x 5 x 7.2 cm, unclear if this represents a myometrial lesion such as fibroid or an endometrial lesion. This is not well characterized by ultrasound. The endometrium is otherwise obscured. Further evaluation could be considered with pelvic MRI, however patient would have to tolerate lying flat and motionless for that exam. Electronically Signed   By: Keith Rake M.D.   On: 12/15/2020 20:49     Assessment and Plan:   1. Symptomatic anemia and history of vaginal bleeding - admitting Hgb 5.3 - FOBT negative, suspect source is due to intermittent vaginal bleeding/clots  - OB GYN plans outpatient evaluation - supplemental iron per primary team - avoid blood thinners  2. Acute on  chronic diastolic CHF - by history has had admissions with profound volume retention in the past, with signs of chronic venous insufficiency on examination - ability to diurese is blunted by electrolyte disturbances - currently on a plan with decreased dose of Lasix with K, Mg supplementation - reviewed with MD and would continue for now - can consider titration of diuretics once electrolytes are more in balance (spironolactone might be a good idea with tendency for hypokalemia) - would also strongly recommend checking thyroid function to exclude component of myxedema - will order  3. Moderate pericardial effusion - etiology and chronicity not clear, no pleuritic type pain to suggest pericarditis, may be related to CHF - agree with trial of colchicine for now - no clinical signs of tamponade or indication for tap - anticipate following in the outpatient setting - ESR, CRP elevated but nonspecific, Coxsackie panel pending  4. Hypokalemia/hypomagnsemia - being managed by primary team  5. Elevated troponin - nonspecific and flat, do not suspect ACS, EF normal - avoid ASA given #1  6. HTN - managed in context of the above - will review with MD - avoid BB given her reported allergy/intolerances  7. Renal insufficiency - Cr ranging 1-1.3 this admission, may reflect CKD stage IIIa  8. Mild AS, mild-moderate TR - no indication for intervention, follow clinically  Risk Assessment/Risk Scores:  New York Heart Association (NYHA) Functional Class NYHA Class III   For questions or updates, please contact [facility] Please consult [email or web address] for contact info under    Signed, Charlie Pitter, PA-C  12/17/2020 10:39 AM

## 2020-12-17 NOTE — Progress Notes (Signed)
Physical Therapy Treatment Patient Details Name: Cynthia Hill MRN: [id number] DOB: April 30, 1951 Today's Date: 12/17/2020    History of Present Illness 70 y.o. female with remarkably poor medical follow-up, states she has been seeing an acupuncturist for some years but has not seen a physician/NP/PA (primary care or otherwise) in "some time" without clear delineation. Pt admitted for acute onset fatigue, dyspnea with exertion, profound ambulatory dysfunction POA, and found to have Symptomatic anemia    PT Comments    Pt assisted with ambulating in hallway and reports overall fatigue today.  Pt able to ambulate 100 feet with RW in hallway.   Follow Up Recommendations  Home health PT;Supervision - Intermittent     Equipment Recommendations  None recommended by PT    Recommendations for Other Services       Precautions / Restrictions Precautions Precautions: Fall    Mobility  Bed Mobility                    Transfers Overall transfer level: Needs assistance Equipment used: Rolling walker (2 wheeled) Transfers: Sit to/from Stand Sit to Stand: Min guard         General transfer comment: cues for hand placement  Ambulation/Gait Ambulation/Gait assistance: Min guard Gait Distance (Feet): 100 Feet Assistive device: Rolling walker (2 wheeled) Gait Pattern/deviations: Step-through pattern;Decreased stride length     General Gait Details: verbal cues for safe use of RW, stopped halfway and performed some LE "stretching"   Stairs             Wheelchair Mobility    Modified Rankin (Stroke Patients Only)       Balance                                            Cognition Arousal/Alertness: Awake/alert Behavior During Therapy: WFL for tasks assessed/performed Overall Cognitive Status: No family/caregiver present to determine baseline cognitive functioning                                 General Comments: poor short  term memory observed      Exercises      General Comments        Pertinent Vitals/Pain Pain Assessment: No/denies pain Pain Intervention(s): Monitored during session;Repositioned    Home Living                      Prior Function            PT Goals (current goals can now be found in the care plan section) Progress towards PT goals: Progressing toward goals    Frequency    Min 3X/week      PT Plan Current plan remains appropriate    Co-evaluation              AM-PAC PT "6 Clicks" Mobility   Outcome Measure  Help needed turning from your back to your side while in a flat bed without using bedrails?: None Help needed moving from lying on your back to sitting on the side of a flat bed without using bedrails?: None Help needed moving to and from a bed to a chair (including a wheelchair)?: A Little Help needed standing up from a chair using your arms (e.g., wheelchair or bedside chair)?: A Little Help needed to  walk in hospital room?: A Little Help needed climbing 3-5 steps with a railing? : A Little 6 Click Score: 20    End of Session Equipment Utilized During Treatment: Gait belt Activity Tolerance: Patient tolerated treatment well Patient left: in bed;with call bell/phone within reach;with nursing/sitter in room Nurse Communication: Mobility status PT Visit Diagnosis: Difficulty in walking, not elsewhere classified (R26.2)     Time: 8185-6314 PT Time Calculation (min) (ACUTE ONLY): 19 min  Charges:  $Gait Training: 8-22 mins                     Arlyce Dice, DPT Acute Rehabilitation Services Pager: [phone] Office: [place] 12/17/2020, 4:10 PM

## 2020-12-18 DIAGNOSIS — N858 Other specified noninflammatory disorders of uterus: Secondary | ICD-10-CM

## 2020-12-18 DIAGNOSIS — I313 Pericardial effusion (noninflammatory): Secondary | ICD-10-CM

## 2020-12-18 DIAGNOSIS — E039 Hypothyroidism, unspecified: Secondary | ICD-10-CM

## 2020-12-18 DIAGNOSIS — I3139 Other pericardial effusion (noninflammatory): Secondary | ICD-10-CM

## 2020-12-18 LAB — RENAL FUNCTION PANEL
Albumin: 3.3 g/dL — ABNORMAL LOW (ref 3.5–5.0)
Anion gap: 11 (ref 5–15)
BUN: 17 mg/dL (ref 8–23)
CO2: 27 mmol/L (ref 22–32)
Calcium: 9.4 mg/dL (ref 8.9–10.3)
Chloride: 101 mmol/L (ref 98–111)
Creatinine, Ser: 1.19 mg/dL — ABNORMAL HIGH (ref 0.44–1.00)
GFR, Estimated: 50 mL/min — ABNORMAL LOW (ref >60)
Glucose, Bld: 84 mg/dL (ref 70–99)
Phosphorus: 3.5 mg/dL (ref 2.5–4.6)
Potassium: 3.5 mmol/L (ref 3.5–5.1)
Sodium: 139 mmol/L (ref 135–145)

## 2020-12-18 LAB — CULTURE, BLOOD (ROUTINE X 2)
Culture: NO GROWTH
Culture: NO GROWTH
Special Requests: ADEQUATE
Special Requests: ADEQUATE

## 2020-12-18 LAB — CBC
HCT: 26.9 % — ABNORMAL LOW (ref 36.0–46.0)
Hemoglobin: 7.4 g/dL — ABNORMAL LOW (ref 12.0–15.0)
MCH: 21.3 pg — ABNORMAL LOW (ref 26.0–34.0)
MCHC: 27.5 g/dL — ABNORMAL LOW (ref 30.0–36.0)
MCV: 77.5 fL — ABNORMAL LOW (ref 80.0–100.0)
Platelets: 189 10*3/uL (ref 150–400)
RBC: 3.47 MIL/uL — ABNORMAL LOW (ref 3.87–5.11)
RDW: 27.3 % — ABNORMAL HIGH (ref 11.5–15.5)
WBC: 6.4 10*3/uL (ref 4.0–10.5)
nRBC: 0 % (ref 0.0–0.2)

## 2020-12-18 LAB — T3: T3, Total: 41 ng/dL — ABNORMAL LOW (ref 71–180)

## 2020-12-18 LAB — BRAIN NATRIURETIC PEPTIDE: B Natriuretic Peptide: 94.5 pg/mL (ref 0.0–100.0)

## 2020-12-18 LAB — MAGNESIUM: Magnesium: 2 mg/dL (ref 1.7–2.4)

## 2020-12-18 MED ORDER — COLCHICINE 0.6 MG PO TABS: 0.6000 mg | ORAL_TABLET | Freq: Every day | ORAL | 1 refills | Status: DC

## 2020-12-18 MED ORDER — LEVOTHYROXINE SODIUM 25 MCG PO TABS: 25.0000 ug | ORAL_TABLET | Freq: Every day | ORAL | 1 refills | Status: DC

## 2020-12-18 MED ORDER — FUROSEMIDE 20 MG PO TABS: 20.0000 mg | ORAL_TABLET | Freq: Two times a day (BID) | ORAL | 3 refills | Status: DC

## 2020-12-18 MED ORDER — SODIUM CHLORIDE 0.9 % IV SOLN
510.0000 mg | Freq: Once | INTRAVENOUS | Status: AC
  Administered 2020-12-18: 510 mg via INTRAVENOUS
  Filled 2020-12-18: qty 510

## 2020-12-18 MED ORDER — FERROUS SULFATE 325 (65 FE) MG PO TBEC: 325.0000 mg | DELAYED_RELEASE_TABLET | Freq: Two times a day (BID) | ORAL | 1 refills | Status: DC

## 2020-12-18 MED ORDER — POTASSIUM CHLORIDE ER 10 MEQ PO TBCR: 10.0000 meq | EXTENDED_RELEASE_TABLET | Freq: Every day | ORAL | 0 refills | Status: DC

## 2020-12-18 NOTE — Discharge Summary (Signed)
Physician Discharge Summary  Cynthia Hill WPY:[id number] DOB: 1951/07/13 DOA: 12/12/2020  PCP: Pcp, No  Admit date: 12/12/2020 Discharge date: 12/18/2020  Admitted From: Home Disposition: Home  Recommendations for Outpatient Follow-up:  1. Follow ups as below. 2. Please obtain CBC/BMP/Mag at follow up 3. Recommend repeat TSH in 4 to 6 weeks and adjusting Synthroid as appropriate 4. Ensure gynecology and cardiology follow-up 5. Please follow up on the following pending results: None  Home Health: PT/RN Equipment/Devices: Rolling walker  Discharge Condition: Stable CODE STATUS: Full code   [facility]. Schedule an appointment as soon as possible for a visit in 2 week(s).   Specialty: Obstetrics and Gynecology Contact information: [place] [id number] [place], [facility].   Specialty: Home Health Services Why: [facility] will follow at discharge with home heallth physical therapy.  Contact information: [place] [address] [phone]                Hospital Course: 70 year old F with unknown PMH as she doesn't follow up with provider presenting with progressive fatigue and dyspnea that has acutely gotten worse about 24 to 48 hours prior to admission.  Patient does not take medication saying "she doesn't get along with conventional medicine".  Instead, she follows up with "[place] doctor" and uses herbal remedy.  On admission, Hgb 5.3 (unknown baseline).  FOBT negative.  K2.7.  2+ pitting edema in BLE. Anemia panel consistent with severe iron deficiency anemia.On further questioning, she reports intermittent vaginal bleeding where she passes blood clot.  She says she has history of "fibroids" had evaluation in [place]. TVUS showed echogenic structure in the uterus measuring 6 x 5 x 7.2 cm.  GYN consulted.   She refused pelvic exam.  GYN recommended outpatient follow-up.   Patient was Transfused 2 units with appropriate response.  Also received IV Feraheme x2 and started on p.o. ferrous sulfate.    In regards to CHF, TTE with with LVEF of 60 to 65%, G2-DD and moderate pericardial effusion.   She was diuresed with IV Lasix.  Cardiology consulted and recommended outpatient follow-up.   Patient's uric acid slightly elevated.  TSH elevated to 16.  Free T4 low at 0.44.  Started on Synthroid 25 mcg daily.  Home health PT and rolling walker ordered as recommended by therapy.  Discharge Diagnoses:  Acute on chronic blood loss symptomatic anemia in the setting of intermittent vaginal bleed Iron deficiency anemia: Anemia panel with severe iron deficiency Uterine mass-TVUS revealed uterine mass measuring 6 x 5 x 7.2 cm.         Recent Labs    12/12/20 1710 12/13/20 1025 12/14/20 0810 12/15/20 0425 12/16/20 0450 12/17/20 0433  HGB 5.3* 7.9* 7.9* 7.2* 7.6* 7.6*  -FOBT negative. -H&H stable after 2 units -IV Feraheme x2 on 3/16 and 3/19, and discharged on p.o. ferrous sulfate -GYN recommended outpatient follow-up for uterine mass.  Acute diastolic CHF: TTE with LVEF of 60 to 65%, G2-DD.  Has significant BLE edema with some underlying chronic venous insufficiency.  TSH elevated to 16.3.  Free T4 low at 0.44. Breathing.  Diuresed with IV Lasix.  Dyspnea and edema improved.  Improved.   Cleared for discharge by cardiology -Discharged on p.o. Lasix 20 mg twice daily -Started Synthroid 25 mcg daily for hypothyroidism -Discontinued amlodipine given BLE edema -Reassess fluid status,  renal function and electrolytes at follow-up. -Encourage leg elevation and compression socks.  Moderate pericardial effusion: Due to CHF.  No signs of pericarditis.    Uric acid, CRP and ESR elevated.  Also have hypothyroidism -Start colchicine and Synthroid -Outpatient follow-up with cardiology for repeat  echocardiogram  Acute respiratory failure with hypoxia-ruled out  Hypertensive urgency-improved. -Discharged on p.o. Lasix -Amlodipine discontinued in the setting of BLE edema.  Slightly elevated troponin likely demand ischemia in the setting of the above.  No RWMA on TTE  Hypokalemia/hypomagnesemia: Resolved. -Discharged on K-Dur 10 mEq daily  Hypothyroidism/myxedema: 16.3.    Free T4 low at 0.44. -Discharged on Synthroid 25 mcg daily.  Debility/generalized weakness/fatigue-likely due to anemia,  hypothyroidism, CHF, pericardial effusion .  Improved. -Treatment as above -Home health PT/RN ordered.  Morbid obesity Body mass index is 39.87 kg/m.  -Encourage lifestyle change to lose weight.          Discharge Exam: Vitals:   12/18/20 1322 12/18/20 1633  BP: (!) 142/55 (!) 159/80  Pulse: 83 90  Resp: 19 20  Temp: 98.4 F (36.9 C) 98.1 F (36.7 C)  SpO2: 99% 100%    GENERAL: No apparent distress.  Nontoxic. HEENT: MMM.  Vision and hearing grossly intact.  NECK: Supple.  No apparent JVD.  RESP: On RA.  No IWOB.  Fair aeration bilaterally. CVS:  RRR. Heart sounds normal.  ABD/GI/GU: Bowel sounds present. Soft. Non tender.  MSK/EXT:  Moves extremities. No apparent deformity.  Trace BLE edema with chronic venous sufficiency SKIN: Lower extremity skin changes consistent with chronic venous sufficiency NEURO: Awake, alert and oriented appropriately.  No apparent focal neuro deficit. PSYCH: Calm. Normal affect.  Discharge Instructions  Discharge Instructions    Ambulatory referral to Cardiology   Complete by: As directed    Ambulatory referral to Gynecology   Complete by: As directed    Diet - low sodium heart healthy   Complete by: As directed    Discharge instructions   Complete by: As directed    It has been a pleasure taking care of you!  You were hospitalized due to fatigue/weakness, shortness of breath and leg swelling likely due to severe anemia,  congestive heart failure and low thyroid level.  You have been treated with blood transfusion and medications, and your symptoms improved.  We are discharging you on more of these medications to continue taking at home.  Please review your new medication list and the directions on your medications before you take them.  In addition to taking your medications as prescribed, we also recommend you avoid alcohol or over-the-counter pain medication other than plain Tylenol, limit the amount of water/fluid you drink to less than 6 cups (1500 cc) a day,  limit your sodium (salt) intake to less than 2 g (2000 mg) a day and weigh yourself daily at the same time and keeping your weight log.    Please follow-up with your primary care doctor in 1 to 2 weeks.  We also recommend follow-up with gynecology and cardiology. We have placed referral to these experts, and you should hear from them next week.   Take care,   Increase activity slowly   Complete by: As directed      Allergies as of 12/18/2020      Reactions   Aspirin Other (See Comments)   unknown   Sulfa Antibiotics Other (See Comments)   unknown   Albuterol Other (See Comments)   Worsens asthma   Atenolol Other (  See Comments)   unknown   Bactroban [mupirocin] Swelling   Benzocaine Other (See Comments)   Throat swelling   Bumex [bumetanide] Other (See Comments)   Throat swelling   Levaquin [levofloxacin] Other (See Comments)   unknown   Morphine And Related Other (See Comments)   unknown   Other Other (See Comments)   Sea food, shell fish   Propranolol Other (See Comments)   unknown   Ptu [propylthiouracil] Other (See Comments)   Doubles heart rate   Spironolactone Other (See Comments)   Throat swelling   Tapazole [methimazole] Other (See Comments)   Irregular heartbeat   Tetracyclines & Related Other (See Comments)   Throat swelling   Toprol Xl [metoprolol] Other (See Comments)   unknown   Triamterene Other (See Comments)    unknown   Valium [diazepam] Other (See Comments)   unknown      Medication List    STOP taking these medications   OVER THE COUNTER MEDICATION   OVER THE COUNTER MEDICATION     TAKE these medications   colchicine 0.6 MG tablet Take 1 tablet (0.6 mg total) by mouth daily.   ferrous sulfate 325 (65 FE) MG EC tablet Take 1 tablet (325 mg total) by mouth 2 (two) times daily.   furosemide 20 MG tablet Commonly known as: Lasix Take 1 tablet (20 mg total) by mouth 2 (two) times daily.   levothyroxine 25 MCG tablet Commonly known as: SYNTHROID Take 1 tablet (25 mcg total) by mouth daily at 6 (six) AM. Start taking on: December 19, 2020   potassium chloride 10 MEQ tablet Commonly known as: KLOR-CON Take 1 tablet (10 mEq total) by mouth daily.       Consultations:  Cardiology  Gynecology  Procedures/Studies:  2D Echo on 12/13/2020 1. Left ventricular ejection fraction, by estimation, is 60 to 65%. The  left ventricle has normal function. The left ventricle has no regional  wall motion abnormalities. Left ventricular diastolic parameters are  consistent with Grade II diastolic  dysfunction (pseudonormalization).  2. Right ventricular systolic function is normal. The right ventricular  size is normal. There is mildly elevated pulmonary artery systolic  pressure.  3. Moderate pericardial effusion.  4. The mitral valve is grossly normal. Trivial mitral valve  regurgitation. No evidence of mitral stenosis.  5. Tricuspid valve regurgitation is mild to moderate.  6. The aortic valve is grossly normal. Aortic valve regurgitation is not  visualized. Mild aortic valve stenosis.    DG Chest 2 View  Result Date: 12/12/2020 CLINICAL DATA:  Cough and shortness of breath x2 weeks. EXAM: CHEST - 2 VIEW COMPARISON:  None. FINDINGS: Mildly increased interstitial lung markings are seen with mild prominence of the pulmonary vasculature. There is no evidence of a pleural effusion or  pneumothorax. The cardiac silhouette is markedly enlarged. The visualized skeletal structures are unremarkable. IMPRESSION: Cardiomegaly with mild congestive heart failure. Electronically Signed   By: Virgina Norfolk M.D.   On: 12/12/2020 16:34   ECHOCARDIOGRAM COMPLETE  Result Date: 12/13/2020    ECHOCARDIOGRAM REPORT   Patient Name:   Cynthia Hill Date of Exam: 12/13/2020 Medical Rec #:  [id number]         Height:       60.0 in Accession #:    [id number]        Weight:       214.7 lb Date of Birth:  18-Jul-1951         BSA:  1.924 m Patient Age:    45 years          BP:           170/71 mmHg Patient Gender: F                 HR:           76 bpm. Exam Location:  Inpatient Procedure: 2D Echo, Cardiac Doppler and Color Doppler Indications:    Dyspnea  History:        Patient has no prior history of Echocardiogram examinations.                 Signs/Symptoms:Dyspnea; Risk Factors:Hypertension.  Sonographer:    Dustin Flock Referring Phys: [id number] [place]  Sonographer Comments: Patient is morbidly obese. Image acquisition challenging due to uncooperative patient. Patient was uncooperative IMPRESSIONS  1. Left ventricular ejection fraction, by estimation, is 60 to 65%. The left ventricle has normal function. The left ventricle has no regional wall motion abnormalities. Left ventricular diastolic parameters are consistent with Grade II diastolic dysfunction (pseudonormalization).  2. Right ventricular systolic function is normal. The right ventricular size is normal. There is mildly elevated pulmonary artery systolic pressure.  3. Moderate pericardial effusion.  4. The mitral valve is grossly normal. Trivial mitral valve regurgitation. No evidence of mitral stenosis.  5. Tricuspid valve regurgitation is mild to moderate.  6. The aortic valve is grossly normal. Aortic valve regurgitation is not visualized. Mild aortic valve stenosis. FINDINGS  Left Ventricle: Left ventricular ejection  fraction, by estimation, is 60 to 65%. The left ventricle has normal function. The left ventricle has no regional wall motion abnormalities. The left ventricular internal cavity size was small. There is no left ventricular hypertrophy. Left ventricular diastolic parameters are consistent with Grade II diastolic dysfunction (pseudonormalization). Right Ventricle: The right ventricular size is normal. No increase in right ventricular wall thickness. Right ventricular systolic function is normal. There is mildly elevated pulmonary artery systolic pressure. The tricuspid regurgitant velocity is 3.14  m/s, and with an assumed right atrial pressure of 3 mmHg, the estimated right ventricular systolic pressure is 24.8 mmHg. Left Atrium: Left atrial size was normal in size. Right Atrium: Right atrial size was normal in size. Pericardium: A moderately sized pericardial effusion is present. Mitral Valve: The mitral valve is grossly normal. Trivial mitral valve regurgitation. No evidence of mitral valve stenosis. Tricuspid Valve: The tricuspid valve is grossly normal. Tricuspid valve regurgitation is mild to moderate. Aortic Valve: The aortic valve is grossly normal. Aortic valve regurgitation is not visualized. Mild aortic stenosis is present. Aortic valve mean gradient measures 10.0 mmHg. Aortic valve peak gradient measures 18.3 mmHg. Aortic valve area, by VTI measures 1.11 cm. Pulmonic Valve: The pulmonic valve was not well visualized. Pulmonic valve regurgitation is not visualized. Aorta: The aortic root and ascending aorta are structurally normal, with no evidence of dilitation. IAS/Shunts: The atrial septum is grossly normal.  LEFT VENTRICLE PLAX 2D LVIDd:         4.70 cm  Diastology LVIDs:         3.50 cm  LV e' medial:    6.42 cm/s LV PW:         1.20 cm  LV E/e' medial:  22.1 LV IVS:        1.20 cm  LV e' lateral:   8.59 cm/s LVOT diam:     1.70 cm  LV E/e' lateral: 16.5 LV SV:  55 LV SV Index:   29 LVOT Area:      2.27 cm  RIGHT VENTRICLE RV Basal diam:  2.50 cm RV S prime:     7.51 cm/s TAPSE (M-mode): 2.5 cm LEFT ATRIUM             Index       RIGHT ATRIUM           Index LA diam:        3.90 cm 2.03 cm/m  RA Area:     16.20 cm LA Vol (A2C):   82.6 ml 42.94 ml/m RA Volume:   40.60 ml  21.10 ml/m LA Vol (A4C):   50.8 ml 26.41 ml/m LA Biplane Vol: 64.3 ml 33.42 ml/m  AORTIC VALVE AV Area (Vmax):    1.05 cm AV Area (Vmean):   1.00 cm AV Area (VTI):     1.11 cm AV Vmax:           213.67 cm/s AV Vmean:          147.500 cm/s AV VTI:            0.496 m AV Peak Grad:      18.3 mmHg AV Mean Grad:      10.0 mmHg LVOT Vmax:         99.20 cm/s LVOT Vmean:        64.900 cm/s LVOT VTI:          0.243 m LVOT/AV VTI ratio: 0.49  AORTA Ao Root diam: 2.70 cm MITRAL VALVE                TRICUSPID VALVE MV Area (PHT): 3.91 cm     TR Peak grad:   39.4 mmHg MV Decel Time: 194 msec     TR Vmax:        314.00 cm/s MV E velocity: 142.00 cm/s MV A velocity: 107.00 cm/s  SHUNTS MV E/A ratio:  1.33         Systemic VTI:  0.24 m                             Systemic Diam: 1.70 cm Mertie Moores MD Electronically signed by Mertie Moores MD Signature Date/Time: 12/13/2020/3:43:11 PM    Final    US PELVIC COMPLETE WITH TRANSVAGINAL  Result Date: 12/15/2020 CLINICAL DATA:  Postmenopausal vaginal bleeding. EXAM: TRANSABDOMINAL AND TRANSVAGINAL ULTRASOUND OF PELVIS TECHNIQUE: Both transabdominal and transvaginal ultrasound examinations of the pelvis were performed. Transabdominal technique was performed for global imaging of the pelvis including uterus, ovaries, adnexal regions, and pelvic cul-de-sac. It was necessary to proceed with endovaginal exam following the transabdominal exam to visualize the uterus, endometrium and adnexa. COMPARISON:  None FINDINGS: Uterus Measurements: 11.3 x 5.5 x 5.5 cm = volume: 179 mL. There is a rounded echogenic structure in the central uterus measuring 6.0 x 5.0 x 7.2 cm. It is unclear if this represents a  myometrial or endometrial lesion. Endometrium Not well-defined. Rounded echogenic structure in the central uterus measuring 6.0 x 5.0 x 7.2 cm, clear if this represents a myometrial or endometrial lesion. Right ovary Not visualized. Left ovary Not visualized. Other findings No adnexal mass or pelvic free fluid. Examination is technically challenging and limited due to body habitus and inability to lay flat. IMPRESSION: Technically limited exam. Central rounded echogenic structure in the uterus measuring 6 x 5 x 7.2 cm, unclear if this represents a myometrial lesion  such as fibroid or an endometrial lesion. This is not well characterized by ultrasound. The endometrium is otherwise obscured. Further evaluation could be considered with pelvic MRI, however patient would have to tolerate lying flat and motionless for that exam. Electronically Signed   By: Keith Rake M.D.   On: 12/15/2020 20:49       The results of significant diagnostics from this hospitalization (including imaging, microbiology, ancillary and laboratory) are listed below for reference.     Microbiology: Recent Results (from the past 240 hour(s))  Culture, blood (routine x 2)     Status: None   Collection Time: 12/12/20  5:00 PM   Specimen: BLOOD  Result Value Ref Range Status   Specimen Description   Final    BLOOD LEFT ANTECUBITAL Performed at [facility] [address] [place], [place] [address]    Special Requests   Final    BOTTLES DRAWN AEROBIC AND ANAEROBIC Blood Culture adequate volume Performed at [facility] [address], [place], [place] [address]    Culture   Final    NO GROWTH 5 DAYS Performed at [facility], [place] [address], [place], [place] [address]    Report Status 12/18/2020 FINAL  Final  Culture, blood (routine x 2)     Status: None   Collection Time: 12/12/20  5:10 PM   Specimen: BLOOD  Result Value Ref Range Status   Specimen Description   Final     BLOOD RIGHT ANTECUBITAL Performed at [facility] [address], [place], [place] [address]    Special Requests   Final    BOTTLES DRAWN AEROBIC AND ANAEROBIC Blood Culture adequate volume Performed at [facility] [address] [place], [place] [address]    Culture   Final    NO GROWTH 5 DAYS Performed at [facility], [place] [address], [place], [place] [address]    Report Status 12/18/2020 FINAL  Final  Resp Panel by RT-PCR (Flu A&B, Covid) Nasopharyngeal Swab     Status: None   Collection Time: 12/12/20  5:10 PM   Specimen: Nasopharyngeal Swab; Nasopharyngeal(NP) swabs in vial transport medium  Result Value Ref Range Status   SARS Coronavirus 2 by RT PCR NEGATIVE NEGATIVE Final    Comment: (NOTE) SARS-CoV-2 target nucleic acids are NOT DETECTED.  The SARS-CoV-2 RNA is generally detectable in upper respiratory specimens during the acute phase of infection. The lowest concentration of SARS-CoV-2 viral copies this assay can detect is 138 copies/mL. A negative result does not preclude SARS-Cov-2 infection and should not be used as the sole basis for treatment or other patient management decisions. A negative result may occur with  improper specimen collection/handling, submission of specimen other than nasopharyngeal swab, presence of viral mutation(s) within the areas targeted by this assay, and inadequate number of viral copies(<138 copies/mL). A negative result must be combined with clinical observations, patient history, and epidemiological information. The expected result is Negative.  Fact Sheet for Patients:  EntrepreneurPulse.com.au  Fact Sheet for Healthcare Providers:  IncredibleEmployment.be  This test is no t yet approved or cleared by the [place] FDA and  has been authorized for detection and/or diagnosis of SARS-CoV-2 by FDA under an Emergency Use Authorization (EUA). This  EUA will remain  in effect (meaning this test can be used) for the duration of the COVID-19 declaration under Section 564(b)(1) of the Act, 21 U.S.C.section 360bbb-3(b)(1), unless the authorization is terminated  or revoked sooner.       Influenza A by PCR NEGATIVE NEGATIVE Final   Influenza B by PCR NEGATIVE NEGATIVE Final    Comment: (NOTE) The Xpert Xpress SARS-CoV-2/FLU/RSV plus assay is intended as an aid in the diagnosis of influenza from Nasopharyngeal swab specimens and should not be used as a sole basis for treatment. Nasal washings and aspirates are unacceptable for Xpert Xpress SARS-CoV-2/FLU/RSV testing.  Fact Sheet for Patients: EntrepreneurPulse.com.au  Fact Sheet for Healthcare Providers: IncredibleEmployment.be  This test is not yet approved or cleared by the [place] FDA and has been authorized for detection and/or diagnosis of SARS-CoV-2 by FDA under an Emergency Use Authorization (EUA). This EUA will remain in effect (meaning this test can be used) for the duration of the COVID-19 declaration under Section 564(b)(1) of the Act, 21 U.S.C. section 360bbb-3(b)(1), unless the authorization is terminated or revoked.  Performed at [facility], [place] [address] [place], [place] [address]      Labs:  CBC: Recent Labs  Lab 12/12/20 1710 12/13/20 1025 12/14/20 0810 12/15/20 0425 12/16/20 0450 12/17/20 0433 12/18/20 0429  WBC 10.2   < > 7.6 7.8 6.9 6.8 6.4  NEUTROABS 8.5*  --   --   --   --   --   --   HGB 5.3*   < > 7.9* 7.2* 7.6* 7.6* 7.4*  HCT 20.9*   < > 28.1* 25.3* 26.8* 26.9* 26.9*  MCV 69.4*   < > 73.9* 74.2* 76.1* 76.9* 77.5*  PLT 203   < > 210 191 180 170 189   < > = values in this interval not displayed.   BMP &GFR Recent Labs  Lab 12/12/20 1827 12/13/20 1025 12/14/20 0810 12/15/20 0425 12/16/20 0450 12/17/20 0433 12/18/20 0429  NA  --    < > 137 139 137 138 139  K  --    <  > 3.0* 3.3* 3.5 2.9* 3.5  CL  --    < > 99 103 102 99 101  CO2  --    < > _0 GLUCOSE  --    < > 95 96 90 122* 84  BUN  --    < > _1 CREATININE  --    < > 1.30* 1.15* 1.17* 1.23* 1.19*  CALCIUM  --    < > 8.8* 8.9 8.7* 9.4 9.4  MG 1.6*  --   --   --   --  1.6* 2.0  PHOS  --   --   --   --   --  3.3 3.5   < > = values in this interval not displayed.   Estimated Creatinine Clearance: 45.9 mL/min (A) (by C-G formula based on SCr of 1.19 mg/dL (H)). Liver & Pancreas: Recent Labs  Lab 12/12/20 1710 12/13/20 1025 12/14/20 0810 12/15/20 0425 12/16/20 0450 12/17/20 0433 12/18/20 0429  AST _2 --   --   ALT _3 --   --   ALKPHOS 61 59 59 54 55  --   --   BILITOT 1.5* 2.2* 1.7* 1.2 1.0  --   --   PROT 7.2 7.2 7.0 6.4* 6.8  --   --   ALBUMIN 3.7 3.6 3.6 3.3* 3.5 3.7 3.3*   No results for input(s): LIPASE, AMYLASE in the last 168 hours. No results for input(s): AMMONIA in the  last 168 hours. Diabetic: No results for input(s): HGBA1C in the last 72 hours. No results for input(s): GLUCAP in the last 168 hours. Cardiac Enzymes: No results for input(s): CKTOTAL, CKMB, CKMBINDEX, TROPONINI in the last 168 hours. No results for input(s): PROBNP in the last 8760 hours. Coagulation Profile: Recent Labs  Lab 12/13/20 1025  INR 1.4*   Thyroid Function Tests: Recent Labs    12/17/20 1103  TSH 16.301*  FREET4 0.44*   Lipid Profile: No results for input(s): CHOL, HDL, LDLCALC, TRIG, CHOLHDL, LDLDIRECT in the last 72 hours. Anemia Panel: No results for input(s): VITAMINB12, FOLATE, FERRITIN, TIBC, IRON, RETICCTPCT in the last 72 hours. Urine analysis: No results found for: COLORURINE, APPEARANCEUR, LABSPEC, PHURINE, GLUCOSEU, HGBUR, BILIRUBINUR, KETONESUR, PROTEINUR, UROBILINOGEN, NITRITE, LEUKOCYTESUR Sepsis Labs: Invalid input(s): PROCALCITONIN, LACTICIDVEN   Time coordinating discharge: 45 minutes  SIGNED:  Mercy Riding,  MD  [facility] 12/18/2020, 7:05 PM  If 7PM-7AM, please contact night-coverage [email or web address]

## 2020-12-18 NOTE — Progress Notes (Signed)
Progress Note  Patient Name: Cynthia Hill Date of Encounter: 12/18/2020  [facility] Cardiologist: Fransico Him, MD   Subjective   Denies any chest pain or SOB  Inpatient Medications    Scheduled Meds: . colchicine  1.2 mg Oral Once   Followed by  . colchicine  0.6 mg Oral Daily  . furosemide  20 mg Intravenous Daily  . levothyroxine  25 mcg Oral Q0600  . mouth rinse  15 mL Mouth Rinse BID  . sodium chloride flush  3 mL Intravenous Q12H   Continuous Infusions: . ferumoxytol     PRN Meds: acetaminophen **OR** acetaminophen, hydrALAZINE, polyethylene glycol   Vital Signs    Vitals:   12/17/20 1410 12/17/20 2051 12/18/20 0500 12/18/20 0611  BP: (!) 156/59 (!) 145/60  (!) 141/82  Pulse: 75 79  78  Resp:      Temp: 97.7 F (36.5 C) 98 F (36.7 C)  97.8 F (36.6 C)  TempSrc: Oral Oral  Oral  SpO2: 99% 100%  98%  Weight:   92.6 kg   Height:        Intake/Output Summary (Last 24 hours) at 12/18/2020 0951 Last data filed at 12/18/2020 0713 Gross per 24 hour  Intake 930 ml  Output 950 ml  Net -20 ml   Last 3 Weights 12/18/2020 12/17/2020 12/14/2020  Weight (lbs) 204 lb 2.3 oz 204 lb 9.4 oz 209 lb 11.2 oz  Weight (kg) 92.6 kg 92.8 kg 95.119 kg      Telemetry    NSR - Personally Reviewed  ECG    No new EKG to review - Personally Reviewed  Physical Exam   GEN: No acute distress.   Neck: No JVD Cardiac: RRR, no murmurs, rubs, or gallops.  Respiratory: Clear to auscultation bilaterally. GI: Soft, nontender, non-distended  MS: No edema; No deformity. Neuro:  Nonfocal  Psych: Normal affect   Labs    High Sensitivity Troponin:   Recent Labs  Lab 12/12/20 1710 12/12/20 1827  TROPONINIHS 18* 18*      Chemistry Recent Labs  Lab 12/14/20 0810 12/15/20 0425 12/16/20 0450 12/17/20 0433 12/18/20 0429  NA 137 139 137 138 139  K 3.0* 3.3* 3.5 2.9* 3.5  CL 99 103 102 99 101  CO2 26 26 25 27 27   GLUCOSE 95 96 90 122* 84  BUN 15 16 15 16 17    CREATININE 1.30* 1.15* 1.17* 1.23* 1.19*  CALCIUM 8.8* 8.9 8.7* 9.4 9.4  PROT 7.0 6.4* 6.8  --   --   ALBUMIN 3.6 3.3* 3.5 3.7 3.3*  AST 22 19 18   --   --   ALT 14 11 14   --   --   ALKPHOS 59 54 55  --   --   BILITOT 1.7* 1.2 1.0  --   --   GFRNONAA 45* 52* 51* 48* 50*  ANIONGAP 12 10 10 12 11      Hematology Recent Labs  Lab 12/16/20 0450 12/17/20 0433 12/18/20 0429  WBC 6.9 6.8 6.4  RBC 3.52* 3.50* 3.47*  HGB 7.6* 7.6* 7.4*  HCT 26.8* 26.9* 26.9*  MCV 76.1* 76.9* 77.5*  MCH 21.6* 21.7* 21.3*  MCHC 28.4* 28.3* 27.5*  RDW 26.1* 26.7* 27.3*  PLT 180 170 189    BNP Recent Labs  Lab 12/12/20 1710 12/18/20 0429  BNP 502.2* 94.5     DDimer No results for input(s): DDIMER in the last 168 hours.   Radiology    No results  found.  Cardiac Studies   2D echo 11/2020 IMPRESSIONS   1. Left ventricular ejection fraction, by estimation, is 60 to 65%. The  left ventricle has normal function. The left ventricle has no regional  wall motion abnormalities. Left ventricular diastolic parameters are  consistent with Grade II diastolic  dysfunction (pseudonormalization).  2. Right ventricular systolic function is normal. The right ventricular  size is normal. There is mildly elevated pulmonary artery systolic  pressure.  3. Moderate pericardial effusion.  4. The mitral valve is grossly normal. Trivial mitral valve  regurgitation. No evidence of mitral stenosis.  5. Tricuspid valve regurgitation is mild to moderate.  6. The aortic valve is grossly normal. Aortic valve regurgitation is not  visualized. Mild aortic valve stenosis.   Patient Profile     70 y.o. female with a hx of suspected CHF by history, HTN, migraines, remote mononucleosis with chronic pain/fatigue, obesity, episodic vaginal bleeding who is being seen today for the evaluation of CHF at the request of Dr. Cyndia Skeeters.  Assessment & Plan    1. Symptomatic anemia and history of vaginal bleeding - admitting  Hgb 5.3 - FOBT negative, suspect source is due to intermittent vaginal bleeding/clots  - OB GYN plans outpatient evaluation - supplemental iron per primary team - avoid blood thinners  2. Acute on chronic diastolic CHF - by history has had admissions with profound volume retention in the past, with signs of chronic venous insufficiency on examination - ability to diurese is blunted by electrolyte disturbances - TSH elevated at 16 c/w hypothyroidism and suspect this is etiology of volume overload given normal LVF on echo.  Likely also etiology of pericardia effusion - continue TED hose for LE edema - BNP was normal and I suspect that volume overload is more related to myxedema  3. Moderate pericardial effusion - chronicity not clear, no pleuritic type pain to suggest pericarditis >> TSH is high and labs c/w hypothyroidism which is likely etiology of pericardial effusion - this should resolve with treatment of hypothyroidism - agree with trial of colchicine for now - no clinical signs of tamponade or indication for tap - anticipate following in the outpatient setting with repeat 2D echo in 2 weeks  4. Hypokalemia/hypomagnsemia - being managed by primary team - K+ 3.5 this am and Mag ok at 2  5. Elevated troponin - nonspecific and flat, do not suspect ACS, EF normal - avoid ASA given #1  6. HTN - BP controlled at 141/110mmHg - avoid BB given her reported allergy/intolerances - avoid amlodipine which can make LE edema worse - will defer to [place]  7. Renal insufficiency - Cr ranging 1-1.3 this admission, may reflect CKD stage IIIa - SCr 1.19 today  8. Mild AS, mild-moderate TR - no indication for intervention, follow clinically  [facility] will sign off.   Medication Recommendations:  Labs per [facility] at discharge Other recommendations (labs, testing, etc):  2D echo in 2 weeks in our office Follow up as an outpatient:  Followup in 4 weeks  For questions or updates, please  contact [place] Please consult [email or web address] for contact info under        Signed, Fransico Him, MD  12/18/2020, 9:51 AM

## 2020-12-19 ENCOUNTER — Other Ambulatory Visit: Payer: Self-pay | Admitting: Physician Assistant

## 2020-12-19 DIAGNOSIS — I313 Pericardial effusion (noninflammatory): Secondary | ICD-10-CM

## 2020-12-19 DIAGNOSIS — I3139 Other pericardial effusion (noninflammatory): Secondary | ICD-10-CM

## 2020-12-21 LAB — COXSACKIE A VIRUS ANTIBODIES
Coxsackie A16 IgG: NEGATIVE titer
Coxsackie A16 IgM: NEGATIVE titer
Coxsackie A24 IgG: NEGATIVE titer
Coxsackie A24 IgM: NEGATIVE titer
Coxsackie A7 IgG: NEGATIVE titer
Coxsackie A7 IgM: NEGATIVE titer
Coxsackie A9 IgG: NEGATIVE titer
Coxsackie A9 IgM: NEGATIVE titer

## 2020-12-22 LAB — COXSACKIE B VIRUS ANTIBODIES
Coxsackie B1 Ab: NEGATIVE
Coxsackie B2 Ab: NEGATIVE
Coxsackie B3 Ab: NEGATIVE
Coxsackie B4 Ab: NEGATIVE
Coxsackie B5 Ab: NEGATIVE
Coxsackie B6 Ab: NEGATIVE

## 2020-12-28 DIAGNOSIS — D649 Anemia, unspecified: Secondary | ICD-10-CM | POA: Diagnosis not present

## 2020-12-28 DIAGNOSIS — I10 Essential (primary) hypertension: Secondary | ICD-10-CM | POA: Diagnosis not present

## 2020-12-28 DIAGNOSIS — I509 Heart failure, unspecified: Secondary | ICD-10-CM | POA: Diagnosis not present

## 2020-12-28 DIAGNOSIS — R011 Cardiac murmur, unspecified: Secondary | ICD-10-CM | POA: Diagnosis not present

## 2020-12-28 DIAGNOSIS — E039 Hypothyroidism, unspecified: Secondary | ICD-10-CM | POA: Diagnosis not present

## 2021-01-03 ENCOUNTER — Telehealth (HOSPITAL_COMMUNITY): Payer: Self-pay | Admitting: Cardiology

## 2021-01-03 ENCOUNTER — Encounter: Payer: Medicare Other | Admitting: Obstetrics and Gynecology

## 2021-01-03 ENCOUNTER — Other Ambulatory Visit (HOSPITAL_COMMUNITY): Payer: Medicare Other

## 2021-01-03 ENCOUNTER — Encounter (HOSPITAL_COMMUNITY): Payer: Self-pay

## 2021-01-03 NOTE — Telephone Encounter (Signed)
01/03/21 patient cancelled echocardiogram and does not wish to have this test. Did not want to reschedule/LBW 10:14am Order will be removed from the ECHO WQ. Thank you.

## 2021-01-13 ENCOUNTER — Encounter: Payer: Medicare Other | Admitting: Obstetrics and Gynecology

## 2021-01-27 ENCOUNTER — Ambulatory Visit: Payer: Medicare Other | Admitting: Cardiology

## 2021-11-18 ENCOUNTER — Emergency Department (HOSPITAL_COMMUNITY): Payer: Medicare Other

## 2021-11-18 ENCOUNTER — Encounter (HOSPITAL_COMMUNITY): Payer: Self-pay

## 2021-11-18 ENCOUNTER — Inpatient Hospital Stay (HOSPITAL_COMMUNITY)
Admission: EM | Admit: 2021-11-18 | Discharge: 2021-11-24 | DRG: 291 | Disposition: A | Discharge status: Skilled Nursing Facility | Payer: Medicare Other | Attending: Internal Medicine | Admitting: Internal Medicine

## 2021-11-18 ENCOUNTER — Other Ambulatory Visit: Payer: Self-pay

## 2021-11-18 DIAGNOSIS — J9601 Acute respiratory failure with hypoxia: Secondary | ICD-10-CM | POA: Diagnosis present

## 2021-11-18 DIAGNOSIS — I10 Essential (primary) hypertension: Secondary | ICD-10-CM | POA: Diagnosis not present

## 2021-11-18 DIAGNOSIS — R262 Difficulty in walking, not elsewhere classified: Secondary | ICD-10-CM | POA: Diagnosis not present

## 2021-11-18 DIAGNOSIS — M109 Gout, unspecified: Secondary | ICD-10-CM | POA: Diagnosis not present

## 2021-11-18 DIAGNOSIS — D500 Iron deficiency anemia secondary to blood loss (chronic): Secondary | ICD-10-CM | POA: Diagnosis not present

## 2021-11-18 DIAGNOSIS — E876 Hypokalemia: Secondary | ICD-10-CM | POA: Diagnosis not present

## 2021-11-18 DIAGNOSIS — Z6841 Body Mass Index (BMI) 40.0 and over, adult: Secondary | ICD-10-CM

## 2021-11-18 DIAGNOSIS — I5031 Acute diastolic (congestive) heart failure: Secondary | ICD-10-CM | POA: Diagnosis present

## 2021-11-18 DIAGNOSIS — Z20822 Contact with and (suspected) exposure to covid-19: Secondary | ICD-10-CM | POA: Diagnosis present

## 2021-11-18 DIAGNOSIS — M6281 Muscle weakness (generalized): Secondary | ICD-10-CM | POA: Diagnosis not present

## 2021-11-18 DIAGNOSIS — I130 Hypertensive heart and chronic kidney disease with heart failure and stage 1 through stage 4 chronic kidney disease, or unspecified chronic kidney disease: Secondary | ICD-10-CM | POA: Diagnosis not present

## 2021-11-18 DIAGNOSIS — J811 Chronic pulmonary edema: Secondary | ICD-10-CM | POA: Diagnosis not present

## 2021-11-18 DIAGNOSIS — D649 Anemia, unspecified: Secondary | ICD-10-CM | POA: Diagnosis present

## 2021-11-18 DIAGNOSIS — I517 Cardiomegaly: Secondary | ICD-10-CM | POA: Diagnosis not present

## 2021-11-18 DIAGNOSIS — N1831 Chronic kidney disease, stage 3a: Secondary | ICD-10-CM | POA: Diagnosis not present

## 2021-11-18 DIAGNOSIS — N858 Other specified noninflammatory disorders of uterus: Secondary | ICD-10-CM | POA: Diagnosis not present

## 2021-11-18 DIAGNOSIS — E8770 Fluid overload, unspecified: Secondary | ICD-10-CM | POA: Diagnosis not present

## 2021-11-18 DIAGNOSIS — I3139 Other pericardial effusion (noninflammatory): Secondary | ICD-10-CM | POA: Diagnosis not present

## 2021-11-18 DIAGNOSIS — Z8249 Family history of ischemic heart disease and other diseases of the circulatory system: Secondary | ICD-10-CM | POA: Diagnosis not present

## 2021-11-18 DIAGNOSIS — E039 Hypothyroidism, unspecified: Secondary | ICD-10-CM | POA: Diagnosis present

## 2021-11-18 DIAGNOSIS — R410 Disorientation, unspecified: Secondary | ICD-10-CM | POA: Diagnosis not present

## 2021-11-18 DIAGNOSIS — I491 Atrial premature depolarization: Secondary | ICD-10-CM | POA: Diagnosis not present

## 2021-11-18 DIAGNOSIS — I110 Hypertensive heart disease with heart failure: Secondary | ICD-10-CM | POA: Diagnosis not present

## 2021-11-18 DIAGNOSIS — N859 Noninflammatory disorder of uterus, unspecified: Secondary | ICD-10-CM | POA: Diagnosis present

## 2021-11-18 DIAGNOSIS — I5033 Acute on chronic diastolic (congestive) heart failure: Secondary | ICD-10-CM | POA: Diagnosis not present

## 2021-11-18 DIAGNOSIS — E46 Unspecified protein-calorie malnutrition: Secondary | ICD-10-CM | POA: Diagnosis not present

## 2021-11-18 DIAGNOSIS — R0602 Shortness of breath: Secondary | ICD-10-CM | POA: Diagnosis not present

## 2021-11-18 DIAGNOSIS — R1312 Dysphagia, oropharyngeal phase: Secondary | ICD-10-CM | POA: Diagnosis not present

## 2021-11-18 DIAGNOSIS — R0609 Other forms of dyspnea: Secondary | ICD-10-CM | POA: Diagnosis not present

## 2021-11-18 DIAGNOSIS — N950 Postmenopausal bleeding: Secondary | ICD-10-CM | POA: Diagnosis present

## 2021-11-18 DIAGNOSIS — R069 Unspecified abnormalities of breathing: Secondary | ICD-10-CM | POA: Diagnosis not present

## 2021-11-18 DIAGNOSIS — Z7401 Bed confinement status: Secondary | ICD-10-CM | POA: Diagnosis not present

## 2021-11-18 DIAGNOSIS — Z743 Need for continuous supervision: Secondary | ICD-10-CM | POA: Diagnosis not present

## 2021-11-18 DIAGNOSIS — I499 Cardiac arrhythmia, unspecified: Secondary | ICD-10-CM | POA: Diagnosis not present

## 2021-11-18 DIAGNOSIS — R6889 Other general symptoms and signs: Secondary | ICD-10-CM | POA: Diagnosis not present

## 2021-11-18 LAB — COMPREHENSIVE METABOLIC PANEL
ALT: 14 U/L (ref 0–44)
AST: 25 U/L (ref 15–41)
Albumin: 3 g/dL — ABNORMAL LOW (ref 3.5–5.0)
Alkaline Phosphatase: 60 U/L (ref 38–126)
Anion gap: 8 (ref 5–15)
BUN: 10 mg/dL (ref 8–23)
CO2: 24 mmol/L (ref 22–32)
Calcium: 8.7 mg/dL — ABNORMAL LOW (ref 8.9–10.3)
Chloride: 106 mmol/L (ref 98–111)
Creatinine, Ser: 1.15 mg/dL — ABNORMAL HIGH (ref 0.44–1.00)
GFR, Estimated: 52 mL/min — ABNORMAL LOW (ref >60)
Glucose, Bld: 138 mg/dL — ABNORMAL HIGH (ref 70–99)
Potassium: 3.4 mmol/L — ABNORMAL LOW (ref 3.5–5.1)
Sodium: 138 mmol/L (ref 135–145)
Total Bilirubin: 0.8 mg/dL (ref 0.3–1.2)
Total Protein: 6.1 g/dL — ABNORMAL LOW (ref 6.5–8.1)

## 2021-11-18 LAB — CBC WITH DIFFERENTIAL/PLATELET
Abs Immature Granulocytes: 0 10*3/uL (ref 0.00–0.07)
Basophils Absolute: 0 10*3/uL (ref 0.0–0.1)
Basophils Relative: 0 %
Eosinophils Absolute: 0 10*3/uL (ref 0.0–0.5)
Eosinophils Relative: 0 %
HCT: 18.5 % — ABNORMAL LOW (ref 36.0–46.0)
Hemoglobin: 4.6 g/dL — CL (ref 12.0–15.0)
Lymphocytes Relative: 8 %
Lymphs Abs: 0.7 10*3/uL (ref 0.7–4.0)
MCH: 17.7 pg — ABNORMAL LOW (ref 26.0–34.0)
MCHC: 24.9 g/dL — ABNORMAL LOW (ref 30.0–36.0)
MCV: 71.2 fL — ABNORMAL LOW (ref 80.0–100.0)
Monocytes Absolute: 0.4 10*3/uL (ref 0.1–1.0)
Monocytes Relative: 4 %
Neutro Abs: 8.1 10*3/uL — ABNORMAL HIGH (ref 1.7–7.7)
Neutrophils Relative %: 88 %
Platelets: 253 10*3/uL (ref 150–400)
RBC: 2.6 MIL/uL — ABNORMAL LOW (ref 3.87–5.11)
RDW: 23.9 % — ABNORMAL HIGH (ref 11.5–15.5)
WBC: 9.2 10*3/uL (ref 4.0–10.5)
nRBC: 0 % (ref 0.0–0.2)
nRBC: 0 /100 WBC

## 2021-11-18 LAB — RETICULOCYTES
Immature Retic Fract: 22.8 % — ABNORMAL HIGH (ref 2.3–15.9)
RBC.: 2.65 MIL/uL — ABNORMAL LOW (ref 3.87–5.11)
Retic Count, Absolute: 57.5 10*3/uL (ref 19.0–186.0)
Retic Ct Pct: 2.2 % (ref 0.4–3.1)

## 2021-11-18 LAB — VITAMIN B12: Vitamin B-12: 762 pg/mL (ref 180–914)

## 2021-11-18 LAB — FOLATE: Folate: 14 ng/mL (ref >5.9)

## 2021-11-18 LAB — IRON AND TIBC
Iron: 21 ug/dL — ABNORMAL LOW (ref 28–170)
Saturation Ratios: 5 % — ABNORMAL LOW (ref 10.4–31.8)
TIBC: 406 ug/dL (ref 250–450)
UIBC: 385 ug/dL

## 2021-11-18 LAB — FERRITIN: Ferritin: 6 ng/mL — ABNORMAL LOW (ref 11–307)

## 2021-11-18 LAB — RESP PANEL BY RT-PCR (FLU A&B, COVID) ARPGX2
Influenza A by PCR: NEGATIVE
Influenza B by PCR: NEGATIVE
SARS Coronavirus 2 by RT PCR: NEGATIVE

## 2021-11-18 LAB — TROPONIN I (HIGH SENSITIVITY): Troponin I (High Sensitivity): 14 ng/L (ref <18)

## 2021-11-18 LAB — PREPARE RBC (CROSSMATCH)

## 2021-11-18 LAB — BRAIN NATRIURETIC PEPTIDE: B Natriuretic Peptide: 640.1 pg/mL — ABNORMAL HIGH (ref 0.0–100.0)

## 2021-11-18 LAB — POC OCCULT BLOOD, ED: Fecal Occult Bld: NEGATIVE

## 2021-11-18 MED ORDER — SODIUM CHLORIDE 0.9 % IV SOLN: 10.0000 mL/h | Freq: Once | INTRAVENOUS | Status: DC

## 2021-11-18 MED ORDER — FUROSEMIDE 10 MG/ML IJ SOLN
40.0000 mg | Freq: Once | INTRAMUSCULAR | Status: AC
  Administered 2021-11-19: 40 mg via INTRAVENOUS
  Filled 2021-11-18: qty 4

## 2021-11-18 MED ORDER — FUROSEMIDE 10 MG/ML IJ SOLN
40.0000 mg | Freq: Once | INTRAMUSCULAR | Status: AC
  Administered 2021-11-18: 40 mg via INTRAVENOUS
  Filled 2021-11-18: qty 4

## 2021-11-18 NOTE — ED Notes (Signed)
Per admitting MD, pt's labs to be collected at the end of 3rd unit of RBC

## 2021-11-18 NOTE — ED Provider Notes (Signed)
[facility] EMERGENCY DEPARTMENT Provider Note   CSN: [id number] Arrival date & time: 11/18/21  1514     History  Chief Complaint  Patient presents with   Shortness of Breath    Cynthia Hill is a 71 y.o. female.  The history is provided by the patient.  Shortness of Breath Severity:  Moderate Onset quality:  Gradual Duration:  1 month Timing:  Constant Progression:  Worsening Chronicity:  New Context: activity   Relieved by:  Nothing Worsened by:  Activity Associated symptoms: no abdominal pain, no chest pain, no claudication, no cough, no diaphoresis, no ear pain, no fever, no headaches, no neck pain, no sore throat, no sputum production and no syncope   Risk factors: no hx of PE/DVT       Home Medications Prior to Admission medications   Medication Sig Start Date End Date Taking? Authorizing Provider  colchicine 0.6 MG tablet Take 1 tablet (0.6 mg total) by mouth daily. Patient not taking: Reported on 11/18/2021 12/18/20   Mercy Riding, MD  ferrous sulfate 325 (65 FE) MG EC tablet Take 1 tablet (325 mg total) by mouth 2 (two) times daily. 12/18/20 06/16/21  Mercy Riding, MD  furosemide (LASIX) 20 MG tablet Take 1 tablet (20 mg total) by mouth 2 (two) times daily. 12/18/20 06/16/21  Mercy Riding, MD  levothyroxine (SYNTHROID) 25 MCG tablet Take 1 tablet (25 mcg total) by mouth daily at 6 (six) AM. Patient not taking: Reported on 11/18/2021 12/19/20   Mercy Riding, MD  potassium chloride (KLOR-CON) 10 MEQ tablet Take 1 tablet (10 mEq total) by mouth daily. Patient not taking: Reported on 11/18/2021 12/18/20   Mercy Riding, MD      Allergies    Aspirin, Sulfa antibiotics, Albuterol, Atenolol, Bactroban [mupirocin], Benzocaine, Bumex [bumetanide], Levaquin [levofloxacin], Morphine and related, Other, Propranolol, Ptu [propylthiouracil], Spironolactone, Tapazole [methimazole], Tetracyclines & related, Toprol xl [metoprolol], Triamterene, and Valium  [diazepam]    Review of Systems   Review of Systems  Constitutional:  Negative for diaphoresis and fever.  HENT:  Negative for ear pain and sore throat.   Respiratory:  Positive for shortness of breath. Negative for cough and sputum production.   Cardiovascular:  Negative for chest pain, claudication and syncope.  Gastrointestinal:  Negative for abdominal pain.  Musculoskeletal:  Negative for neck pain.  Neurological:  Negative for headaches.   Physical Exam Updated Vital Signs BP (!) 165/87    Pulse 85    Temp 98.8 F (37.1 C) (Oral)    Resp (!) 23    Ht 5' (1.524 m)    Wt 95 kg    SpO2 100%    BMI 40.90 kg/m  Physical Exam Vitals and nursing note reviewed.  Constitutional:      General: She is in acute distress.     Appearance: She is well-developed.  HENT:     Head: Normocephalic and atraumatic.  Eyes:     Extraocular Movements: Extraocular movements intact.     Conjunctiva/sclera: Conjunctivae normal.     Pupils: Pupils are equal, round, and reactive to light.  Cardiovascular:     Rate and Rhythm: Regular rhythm. Tachycardia present.     Heart sounds: No murmur heard. Pulmonary:     Effort: Pulmonary effort is normal. Tachypnea present. No respiratory distress.     Breath sounds: Decreased breath sounds and rhonchi present.  Abdominal:     Palpations: Abdomen is soft.     Tenderness:  There is no abdominal tenderness.  Musculoskeletal:        General: No swelling. Normal range of motion.     Cervical back: Normal range of motion and neck supple.     Right lower leg: Edema present.     Left lower leg: Edema present.     Comments: 2+ pitting edema bilaterally  Skin:    General: Skin is warm and dry.     Capillary Refill: Capillary refill takes less than 2 seconds.  Neurological:     General: No focal deficit present.     Mental Status: She is alert.  Psychiatric:        Mood and Affect: Mood normal.    ED Results / Procedures / Treatments   Labs (all labs  ordered are listed, but only abnormal results are displayed) Labs Reviewed  CBC WITH DIFFERENTIAL/PLATELET - Abnormal; Notable for the following components:      Result Value   RBC 2.60 (*)    Hemoglobin 4.6 (*)    HCT 18.5 (*)    MCV 71.2 (*)    MCH 17.7 (*)    MCHC 24.9 (*)    RDW 23.9 (*)    Neutro Abs 8.1 (*)    All other components within normal limits  COMPREHENSIVE METABOLIC PANEL - Abnormal; Notable for the following components:   Potassium 3.4 (*)    Glucose, Bld 138 (*)    Creatinine, Ser 1.15 (*)    Calcium 8.7 (*)    Total Protein 6.1 (*)    Albumin 3.0 (*)    GFR, Estimated 52 (*)    All other components within normal limits  BRAIN NATRIURETIC PEPTIDE - Abnormal; Notable for the following components:   B Natriuretic Peptide 640.1 (*)    All other components within normal limits  IRON AND TIBC - Abnormal; Notable for the following components:   Iron 21 (*)    Saturation Ratios 5 (*)    All other components within normal limits  FERRITIN - Abnormal; Notable for the following components:   Ferritin 6 (*)    All other components within normal limits  RETICULOCYTES - Abnormal; Notable for the following components:   RBC. 2.65 (*)    Immature Retic Fract 22.8 (*)    All other components within normal limits  RESP PANEL BY RT-PCR (FLU A&B, COVID) ARPGX2  VITAMIN B12  FOLATE  POC OCCULT BLOOD, ED  TYPE AND SCREEN  PREPARE RBC (CROSSMATCH)  TROPONIN I (HIGH SENSITIVITY)  TROPONIN I (HIGH SENSITIVITY)    EKG EKG Interpretation  Date/Time:  Friday November 18 2021 15:30:05 EST Ventricular Rate:  88 PR Interval:  134 QRS Duration: 103 QT Interval:  400 QTC Calculation: 484 R Axis:   -44 Text Interpretation: Sinus rhythm Left axis deviation Low voltage, precordial leads Abnormal R-wave progression, late transition Confirmed by Lennice Sites ([id number]) on 11/18/2021 4:14:24 PM  Radiology DG Chest Portable 1 View  Result Date: 11/18/2021 CLINICAL DATA:  Shortness  of breath, history of CHF, hypertension EXAM: PORTABLE CHEST 1 VIEW COMPARISON:  Portable exam 1536 hours compared to 12/12/2020 FINDINGS: Enlargement of cardiac silhouette with pulmonary vascular congestion. Interstitial infiltrates in the mid to lower lungs likely minimal pulmonary edema. Loss of the lateral LEFT diaphragmatic silhouette suspect minimal LEFT pleural effusion. No pneumothorax. Bones demineralized. IMPRESSION: Probable mild CHF. Electronically Signed   By: Lavonia Dana M.D.   On: 11/18/2021 15:46    Procedures .Critical Care Performed by: Lennice Sites, DO  Authorized by: Lennice Sites, DO   Critical care provider statement:    Critical care time (minutes):  34   Critical care was necessary to treat or prevent imminent or life-threatening deterioration of the following conditions:  Circulatory failure and cardiac failure   Critical care was time spent personally by me on the following activities:  Blood draw for specimens, development of treatment plan with patient or surrogate, discussions with primary provider, evaluation of patient's response to treatment, examination of patient, obtaining history from patient or surrogate, ordering and performing treatments and interventions, ordering and review of laboratory studies, ordering and review of radiographic studies, pulse oximetry, re-evaluation of patient's condition and review of old charts   Care discussed with: admitting provider      Medications Ordered in ED Medications  0.9 %  sodium chloride infusion (0 mL/hr Intravenous Hold 11/18/21 1644)  furosemide (LASIX) injection 40 mg (40 mg Intravenous Given 11/18/21 1630)    ED Course/ Medical Decision Making/ A&P                           Medical Decision Making Amount and/or Complexity of Data Reviewed Labs: ordered. Radiology: ordered.  Risk Prescription drug management. Decision regarding hospitalization.   Alzina Golda is here with shortness of breath.   Unremarkable vitals.  Symptoms for the last month.  Gotten worse.  Unable to lie flat.  Swelling to her legs.  Does not take any medications.  She appears pale.  Upon chart review she has a history of anemia with similar presentation in the past.  Rectal exam is unremarkable.  No melena or hematochezia.  She appears volume overloaded.  Some crackles in her lungs on exam.  She is on 2 L of oxygen for comfort.  Differential diagnosis includes heart failure versus anemia.  She is not having any chest pain have lower concern for acute coronary syndrome or pneumonia as she is not having any cough or sputum production or fever.  We will pursue work-up with CBC, BMP, type and screen, Hemoccult, anemia panel, chest x-ray.  EKG per my review and interpretation shows sinus rhythm.  No ischemic changes.  Per my review and interpretation of the chest x-ray there is some mild edema.  No obvious pneumonia.  Per my review and interpretation of the labs patient has mild elevation in BNP to 640.  Troponin within normal limits.  Anemia panel consistent with iron deficiency.  Patient severely anemic with blood count of 4.6.  Hemoccult is negative.  Doubt GI bleed.  Overall suspect iron deficiency causing her symptoms.  Possibly has also caused heart failure type symptoms as well.  We will transfuse her with 3 units of packed red blood cells.  We will give a dose IV Lasix.  Will admit to medicine for further care.  Upon chart review she did have a small pericardial effusion last time as well.  She will likely need echocardiogram.  She appears hemodynamically stable at this time.  Will admit to medicine.  This chart was dictated using voice recognition software.  Despite best efforts to proofread,  errors can occur which can change the documentation meaning.         Final Clinical Impression(s) / ED Diagnoses Final diagnoses:  Symptomatic anemia  Hypervolemia, unspecified hypervolemia type    Rx / DC Orders ED  Discharge Orders     None         Lennice Sites, DO 11/18/21  1732 ° °

## 2021-11-18 NOTE — Progress Notes (Signed)
Received page from RN for labored breathing.  Patient evaluated at bedside.  BP ranging in the 883G systolic, heart rate in the low 80s, O2 sats in 100s on 5 L [place], similar to my prior evaluation in the ED.  Patient answers questions appropriately and states that she does not feel that her breathing has worsened.  She mentions several times that she cannot lie flat.  Exam: Patient with labored breathing but satting well on 5 L HFNC.  1-2+ BLE pitting edema.  Bibasilar crackles noted, slightly worse compared to prior exam.  Plan: Given worsening crackles on exam and recent adequate response to IV 40 Lasix in the ED, will give another IV 40 Lasix at this time.  Continue to monitor closely.

## 2021-11-18 NOTE — ED Triage Notes (Signed)
Presents to ER via EMS with C/O shortness of breath. Resides at independent living fac. Has been noticing increased edema in legs, shortness of breath with exertion, describes orthopnea. BP was 210/100 and 158/90 after 3 NTG.

## 2021-11-18 NOTE — ED Notes (Signed)
Pt blood consent scanned in chart

## 2021-11-18 NOTE — ED Notes (Signed)
MD aware of HGB

## 2021-11-18 NOTE — H&P (Signed)
Date: 11/18/2021               Patient Name:  Cynthia Hill MRN: [id number]  DOB: Feb 12, 1951 Age / Sex: 71 y.o., female   PCP: Pcp, No         Medical Service: Internal Medicine Teaching Service         Attending Physician: Dr. Lucious Groves, DO    First Contact: Dr. Jeanice Lim Pager: [phone]  Second Contact: Dr. Johnney Ou Pager: [phone]       After Hours (After 5p/  First Contact Pager: [phone]  weekends / holidays): Second Contact Pager: [phone]   Chief Complaint: SOB  History of Present Illness: Cynthia Hill is a 71 y.o. female with a PMHx of CHF (most recent EF 60-65% with G2DD in March 2022), IDA, and a uterine mass but with an otherwise unknown PMHx who presents here today with chief complaint of SOB.  The patient stated this started about 2-3 weeks ago when her shortness of breath started getting progressively worse.  She endorses difficulty with breathing both while sitting up and with lying flat.  She has also had progressively worsening fatigue and lower extremity swelling since then.  She states that she was admitted last year for shortness of breath and anemia, however she feels worse today than she did last year.  Denies recent illness.  Endorses pleuritic chest pain, however this has improved while she has been here in the hospital.  She has also noticed some intermittent vaginal bleeding, however she states she has not had much bleeding recently.  When she does have vaginal bleeding, she requires about a pad at a time.  She has been told she has fibroids in the past but has not had a hysterectomy performed.  Has some mild RLQ abdominal pain that has been chronic since she was a teenager.  Denies hematuria.  Has some constipation in the mornings.  Sees a naturopathic doctor but does not regularly take herbal supplements.  Receives acupuncture. She is not on any medications besides a "thyroid 3X" medication that she gets from her brother.  She has not had a  colonoscopy in the past. There are no other complaints or concerns today.  Meds:  No outpatient medications have been marked as taking for the 11/18/21 encounter [facility] Encounter).   Allergies: Allergies as of 11/18/2021 - Review Complete 11/18/2021  Allergen Reaction Noted   Aspirin Other (See Comments) 12/12/2020   Sulfa antibiotics Other (See Comments) 12/12/2020   Albuterol Other (See Comments) 12/12/2020   Atenolol Other (See Comments) 12/12/2020   Bactroban [mupirocin] Swelling 12/12/2020   Benzocaine Other (See Comments) 12/12/2020   Bumex [bumetanide] Other (See Comments) 12/12/2020   Levaquin [levofloxacin] Other (See Comments) 12/12/2020   Morphine and related Other (See Comments) 12/12/2020   Other Other (See Comments) 12/12/2020   Propranolol Other (See Comments) 12/12/2020   Ptu [propylthiouracil] Other (See Comments) 12/12/2020   Spironolactone Other (See Comments) 12/12/2020   Tapazole [methimazole] Other (See Comments) 12/12/2020   Tetracyclines & related Other (See Comments) 12/12/2020   Toprol xl [metoprolol] Other (See Comments) 12/12/2020   Triamterene Other (See Comments) 12/12/2020   Valium [diazepam] Other (See Comments) 12/12/2020   Past Medical History:  Diagnosis Date   CHF (congestive heart failure) (HCC)    Chronic fatigue    Chronic pain    Fibroids    HTN (hypertension)    Migraine    Mononucleosis    Family History:  Family History  Problem Relation Age of Onset   Heart disease Mother    Blindness Mother    Heart disease Father    Social History: The patient states that she has difficulty performing some of her ADLs and IDLs and therefore requires an aide in the home.  Denies tobacco, alcohol, other drug use.  Review of Systems: A complete ROS was negative except as per HPI.   Physical Exam: Blood pressure (!) 179/73, pulse 82, temperature 98.1 F (36.7 C), temperature source Oral, resp. rate (!) 22, height 5' (1.524 m), weight 95  kg, SpO2 100 %. General: NAD, labored breathing on 6L HFNC, pale-appearing HE: Normocephalic, atraumatic, EOMI, Conjunctivae normal ENT: No congestion, no rhinorrhea, no exudate or erythema  Cardiovascular: Normal rate, regular rhythm. +systolic murmur, no rubs or gallops Pulmonary: Increased effort, tachypneic. Decreased breath sounds throughout. No wheezing Abdominal: soft, bowel sounds present, mild TTP to deep palpation of RLQ. Musculoskeletal: no deformity, injury or tenderness in extremities, 2+ pitting edema bilaterally Skin: Warm, dry. Venous stasis changes Psychiatric/Behavioral: normal mood, normal behavior    EKG: personally reviewed my interpretation is left axis deviation  CXR: personally reviewed my interpretation is probable mild CHF  Assessment & Plan by Problem: Principal Problem:   Symptomatic anemia  Symptomatic anemia IDA Intermittent vaginal bleeding Uterine mass Patient presents with a 2 week history of progressively worsening SOB and intermittent vaginal bleeding. She had a similar presentation here 1 year ago, at which time lab studies were consistent with IDA, and she was given blood transfusions with appropriate response. No prior colonoscopy. Today, the patient is pale-appearing with increased pulmonary effort on 6 L HFNC. Hgb of 4.6, MCV 71.2. Iron 21, TIBC 406, sat raio 5%. Ferritin 6. Abs retic count of 1. Smear shows tear drop cells and ovalocytes. FOBT negative. Currently receiving 3 units PRBCs. -Post transfusion H&H pending -Trasnfuse PRBCs with a goal Hgb >7 -Trend CBC  HFpEF The patient's most recent echo was in March 2022 which showed EF 60-65% with G2DD and a moderate pericardial effusion.  The patient is not on any diuretics at home.  On exam, she has decreased breath sounds bilaterally with 2+ pitting edema in BLE.  BNP is elevated to 640.1 and has probable mild CHF on CXR today. Likely 2/2 poorly controlled HTN, morbid obesity, or chronic anemia.  She has already received 1X IV Lasix 40 mg in the ED with adequate urine output. -Consider redosing diuresis in the AM -Echo pending -Daily weights -Strict I/O's  Hypothyroidism Patient with previous thyroid labs during last hospitalization last year showing a TSH 16.3, free T4 of 0.44.  She was discharged on Synthroid 25 mcg daily at that time but states to [place] that she has not taken any medications besides a "thyroid 3X medication" at home. Pt presented with exophthalmos and pretibial mydedema c/f possible graves disease. There may be a mild concern for myxedema coma although less likely. Obtaining labs as below for further workup. -Cortisol, TSH, free T4 pending  Pericardial effusion During her last hospitalization, patient was found to have moderate pericardial effusions.  She was started on colchicine and Synthroid at that time.  Patient has not been on these medications recently.  Bedside ultrasound was performed today showing mild pericardial effusions.  Patient also endorses to [place] a history of lupus that she was told she had "a long long time ago."  Review of records does not reveal this diagnosis or any evidence of prior treatment for lupus.   -ESR, CRP,  ANA with reflex pending -Consider restarting NSAIDs/colchicine versus prednisone in the a.m.  HTN Patient is not on any antihypertensives at home.  Her blood pressures have been ranging in the 947M-546T systolic while in the ED. S/p IV Lasix in the ED. Pt is asymptomatic and is without evidence of end-organ damage. -CTM  Dispo: Admit patient to Inpatient with expected length of stay greater than 2 midnights.  Signed: Orvis Brill, MD 11/18/2021, 11:01 PM  Pager: [phone] After 5pm on weekdays and 1pm on weekends: On Call pager: [phone]

## 2021-11-18 NOTE — ED Notes (Addendum)
Admitting at bedside 

## 2021-11-19 ENCOUNTER — Inpatient Hospital Stay (HOSPITAL_COMMUNITY): Payer: Medicare Other

## 2021-11-19 DIAGNOSIS — R0609 Other forms of dyspnea: Secondary | ICD-10-CM | POA: Diagnosis not present

## 2021-11-19 DIAGNOSIS — I3139 Other pericardial effusion (noninflammatory): Secondary | ICD-10-CM | POA: Diagnosis not present

## 2021-11-19 DIAGNOSIS — D649 Anemia, unspecified: Secondary | ICD-10-CM

## 2021-11-19 DIAGNOSIS — I110 Hypertensive heart disease with heart failure: Secondary | ICD-10-CM

## 2021-11-19 DIAGNOSIS — I5031 Acute diastolic (congestive) heart failure: Secondary | ICD-10-CM | POA: Diagnosis not present

## 2021-11-19 DIAGNOSIS — N858 Other specified noninflammatory disorders of uterus: Secondary | ICD-10-CM

## 2021-11-19 LAB — ECHOCARDIOGRAM COMPLETE
AR max vel: 2 cm2
AV Area VTI: 1.84 cm2
AV Area mean vel: 2.06 cm2
AV Mean grad: 11.3 mmHg
AV Peak grad: 23.1 mmHg
Ao pk vel: 2.4 m/s
Area-P 1/2: 3.72 cm2
Calc EF: 69 %
Height: 60 in
MV VTI: 2.57 cm2
Radius: 0.4 cm
S' Lateral: 3.1 cm
Single Plane A2C EF: 69.8 %
Single Plane A4C EF: 64.1 %
Weight: 3485.03 oz

## 2021-11-19 LAB — HEMOGLOBIN AND HEMATOCRIT, BLOOD
HCT: 25.7 % — ABNORMAL LOW (ref 36.0–46.0)
Hemoglobin: 7.5 g/dL — ABNORMAL LOW (ref 12.0–15.0)

## 2021-11-19 LAB — CBC
HCT: 30.6 % — ABNORMAL LOW (ref 36.0–46.0)
Hemoglobin: 8.7 g/dL — ABNORMAL LOW (ref 12.0–15.0)
MCH: 21.1 pg — ABNORMAL LOW (ref 26.0–34.0)
MCHC: 28.4 g/dL — ABNORMAL LOW (ref 30.0–36.0)
MCV: 74.3 fL — ABNORMAL LOW (ref 80.0–100.0)
Platelets: 193 10*3/uL (ref 150–400)
RBC: 4.12 MIL/uL (ref 3.87–5.11)
RDW: 22.1 % — ABNORMAL HIGH (ref 11.5–15.5)
WBC: 10.4 10*3/uL (ref 4.0–10.5)
nRBC: 0.2 % (ref 0.0–0.2)

## 2021-11-19 LAB — BLOOD GAS, VENOUS
Acid-Base Excess: 4.2 mmol/L — ABNORMAL HIGH (ref 0.0–2.0)
Bicarbonate: 30.5 mmol/L — ABNORMAL HIGH (ref 20.0–28.0)
O2 Saturation: 52.2 %
Patient temperature: 37
pCO2, Ven: 54 mmHg (ref 44–60)
pH, Ven: 7.36 (ref 7.25–7.43)
pO2, Ven: 32 mmHg (ref 32–45)

## 2021-11-19 LAB — BASIC METABOLIC PANEL
Anion gap: 11 (ref 5–15)
BUN: 13 mg/dL (ref 8–23)
CO2: 24 mmol/L (ref 22–32)
Calcium: 9.2 mg/dL (ref 8.9–10.3)
Chloride: 102 mmol/L (ref 98–111)
Creatinine, Ser: 1.19 mg/dL — ABNORMAL HIGH (ref 0.44–1.00)
GFR, Estimated: 49 mL/min — ABNORMAL LOW (ref >60)
Glucose, Bld: 101 mg/dL — ABNORMAL HIGH (ref 70–99)
Potassium: 3 mmol/L — ABNORMAL LOW (ref 3.5–5.1)
Sodium: 137 mmol/L (ref 135–145)

## 2021-11-19 LAB — HEMOGLOBIN A1C
Hgb A1c MFr Bld: 4.8 % (ref 4.8–5.6)
Mean Plasma Glucose: 91.06 mg/dL

## 2021-11-19 LAB — MRSA NEXT GEN BY PCR, NASAL: MRSA by PCR Next Gen: NOT DETECTED

## 2021-11-19 LAB — PHOSPHORUS: Phosphorus: 4.3 mg/dL (ref 2.5–4.6)

## 2021-11-19 LAB — TROPONIN I (HIGH SENSITIVITY): Troponin I (High Sensitivity): 16 ng/L (ref <18)

## 2021-11-19 LAB — C-REACTIVE PROTEIN: CRP: 9.9 mg/dL — ABNORMAL HIGH (ref <1.0)

## 2021-11-19 LAB — MAGNESIUM
Magnesium: 1.8 mg/dL (ref 1.7–2.4)
Magnesium: 1.8 mg/dL (ref 1.7–2.4)

## 2021-11-19 LAB — SEDIMENTATION RATE: Sed Rate: 30 mm/hr — ABNORMAL HIGH (ref 0–22)

## 2021-11-19 LAB — GLUCOSE, CAPILLARY: Glucose-Capillary: 105 mg/dL — ABNORMAL HIGH (ref 70–99)

## 2021-11-19 LAB — T4, FREE: Free T4: 0.31 ng/dL — ABNORMAL LOW (ref 0.61–1.12)

## 2021-11-19 LAB — CORTISOL: Cortisol, Plasma: 18.2 ug/dL

## 2021-11-19 LAB — TSH: TSH: 12.793 u[IU]/mL — ABNORMAL HIGH (ref 0.350–4.500)

## 2021-11-19 MED ORDER — LEVOTHYROXINE SODIUM 50 MCG PO TABS
50.0000 ug | ORAL_TABLET | Freq: Every day | ORAL | Status: DC
  Administered 2021-11-20 – 2021-11-24 (×5): 50 ug via ORAL
  Filled 2021-11-19 (×5): qty 1

## 2021-11-19 MED ORDER — SODIUM CHLORIDE 0.9 % IV SOLN
510.0000 mg | Freq: Once | INTRAVENOUS | Status: AC
  Administered 2021-11-19: 510 mg via INTRAVENOUS
  Filled 2021-11-19: qty 17

## 2021-11-19 MED ORDER — POTASSIUM CHLORIDE CRYS ER 20 MEQ PO TBCR
40.0000 meq | EXTENDED_RELEASE_TABLET | Freq: Once | ORAL | Status: AC
  Administered 2021-11-19: 40 meq via ORAL
  Filled 2021-11-19: qty 2

## 2021-11-19 MED ORDER — POTASSIUM CHLORIDE 10 MEQ/100ML IV SOLN
10.0000 meq | INTRAVENOUS | Status: DC
  Filled 2021-11-19: qty 100

## 2021-11-19 MED ORDER — MAGNESIUM SULFATE 2 GM/50ML IV SOLN
2.0000 g | Freq: Once | INTRAVENOUS | Status: AC
  Administered 2021-11-19: 2 g via INTRAVENOUS
  Filled 2021-11-19: qty 50

## 2021-11-19 MED ORDER — FUROSEMIDE 10 MG/ML IJ SOLN
40.0000 mg | Freq: Two times a day (BID) | INTRAMUSCULAR | Status: DC
  Administered 2021-11-19 – 2021-11-21 (×5): 40 mg via INTRAVENOUS
  Filled 2021-11-19 (×5): qty 4

## 2021-11-19 MED ORDER — FUROSEMIDE 10 MG/ML IJ SOLN: 40.0000 mg | Freq: Every day | INTRAMUSCULAR | Status: DC

## 2021-11-19 MED ORDER — MELATONIN 3 MG PO TABS
3.0000 mg | ORAL_TABLET | Freq: Every day | ORAL | Status: DC
  Administered 2021-11-19 – 2021-11-23 (×5): 3 mg via ORAL
  Filled 2021-11-19 (×5): qty 1

## 2021-11-19 MED ORDER — POTASSIUM CHLORIDE CRYS ER 20 MEQ PO TBCR
40.0000 meq | EXTENDED_RELEASE_TABLET | Freq: Once | ORAL | Status: AC
  Administered 2021-11-20: 40 meq via ORAL
  Filled 2021-11-19: qty 2

## 2021-11-19 NOTE — Progress Notes (Signed)
Paged by RN that patient was feeling she was having worsening trouble breathing. Evaluated at bedside, patient endorsed feeling better since being admitted to the hospital, but still feels as though she is having trouble breathing. Vital signs of BP 186/116, HR of 84, O2 sat of 100% on 3L [place]. Decreased breath sounds bilaterally with some expiratory wheezing and she appears uncomfortable. No JVD present. Cardiac sounds are somewhat distant with regular rate and rhythm. She is alert and oriented to person and situation, but had some difficulty noting the date and who was president.   Patient requesting we reach out to family as she says "I am not going to make it." She is uncertain if she would like [place] to start medications for her blood pressure due to her history of medication allergies and "weird things happening" when medications are given.   Overall does not appear that her respiratory status has worsened compared to earlier today. She was given lasix 40 mg 30 minutes ago. We will check CBC to make sure her hemoglobin is not trending downward, a bmp and mag to assess her electrolytes, and vbg. Low suspicion for tamponade, MI, or PE. There may be a component of anxiety attributing.

## 2021-11-19 NOTE — Plan of Care (Signed)
°  Problem: Education: Goal: Knowledge of General Education information will improve Description: Including pain rating scale, medication(s)/side effects and non-pharmacologic comfort measures Outcome: Progressing   Problem: Clinical Measurements: Goal: Ability to maintain clinical measurements within normal limits will improve Outcome: Progressing   Problem: Health Behavior/Discharge Planning: Goal: Ability to manage health-related needs will improve Outcome: Progressing   Problem: Clinical Measurements: Goal: Respiratory complications will improve Outcome: Progressing   Problem: Clinical Measurements: Goal: Cardiovascular complication will be avoided Outcome: Progressing   Problem: Activity: Goal: Risk for activity intolerance will decrease Outcome: Progressing   Problem: Coping: Goal: Level of anxiety will decrease Outcome: Progressing   Problem: Skin Integrity: Goal: Risk for impaired skin integrity will decrease Outcome: Progressing   Problem: Safety: Goal: Ability to remain free from injury will improve Outcome: Progressing   Problem: Cardiac: Goal: Ability to achieve and maintain adequate cardiopulmonary perfusion will improve Outcome: Progressing

## 2021-11-19 NOTE — Progress Notes (Addendum)
Subjective: I seen and evaluated Cynthia Hill at bedside.  She was speaking in short sentences and breathless throughout the interview.  She endorses that her breathing has not improved much since admission.  She is on nasal cannula 4 L.  She says she especially has difficulty breathing when lying flat.  The bed was raised to the highest angle, sitting up.  Otherwise, she denies chest pain or any other issues.  Objective:  Vital signs in last 24 hours: Vitals:   11/19/21 0338 11/19/21 0603 11/19/21 0800 11/19/21 0900  BP:    (!) 167/80  Pulse:  71 70 72  Resp:    20  Temp:    98.2 F (36.8 C)  TempSrc:    Oral  SpO2:  100% 100% 100%  Weight: 98.8 kg     Height: 5' (1.524 m)      Physical Exam Constitutional:      Appearance: She is obese.     Interventions: Nasal cannula in place.  HENT:     Head: Normocephalic and atraumatic.  Cardiovascular:     Rate and Rhythm: Normal rate.     Pulses:          Posterior tibial pulses are 2+ on the right side and 2+ on the left side.     Heart sounds: Murmur heard.  Pulmonary:     Breath sounds: Examination of the right-lower field reveals rales. Examination of the left-lower field reveals rales. Decreased breath sounds and rales present.     Comments: Increased work of breathing noted Abdominal:     Palpations: Abdomen is soft.  Musculoskeletal:     Right lower leg: 2+ Edema present.     Left lower leg: 2+ Edema present.     Comments: Bilateral skin discoloration, erythema, dry flaky  Skin:    General: Skin is warm and dry.  Psychiatric:        Behavior: Behavior is cooperative.     Assessment/Plan:  Principal Problem:   Symptomatic anemia  Symptomatic iron deficiency anemia On admission patient was found to have a hemoglobin of 4.6.  Patient received 3 units of PRBC with appropriate response.  Repeat CBC significant for hemoglobin of 7.7.  Iron studies reveal iron 21; saturation 5 and ferritin 6. Total iron deficit 1091mg .   Source of anemia unclear.  There is suspicion for gynecologic pathology versus GI bleed.  Patient menopause at age 71.  She endorses intermittent vaginal bleeding for the last several years, lost to follow-up with gynecology.  Patient denies melena or hematochezia.  Given age and obvious blood loss; patient will benefit from diagnostic colonoscopy outpatient.  Also patient will need to follow-up with gynecology outpatient to address vaginal bleeding, which could also contribute to source of blood loss. --IV Feraheme 510 mg 1 dose today  --Trend CBC --Transfuse if hemoglobin  <7  Acute on chronic HFpEF exacerbation Patient presented complaining of progressive shortness of breath and orthopnea.  Patient does not take any diuretics at home.  BNP elevated at 640 and chest x-ray obtained on admission revealed mild CHF.  Patient has received 2 doses of IV Lasix 40 mg with net output 1.5 L.  On exam, appeared volume overloaded.  Rales auscultated on bilateral lower lobes of the lung.  Patient endorses continued shortness of breath.  Patient on 4 L nasal cannula, saturating well, 100%.  Patient is not on home oxygen.  Echo obtained today reveals preserved ejection fraction 60 to 65%; no regional wall  motion abnormalities; mild left ventricular hypertrophy with grade 2 diastolic dysfunction.  Moderate pericardial effusion noted, no evidence of cardiac tamponade.  Moderate calcifications and thickening of the aortic valve noted.  Moderately elevated pulmonary artery systolic pressure noted.  We will continue to monitor volume status. --IV Lasix 40 mg BID --Daily weights --Strict I/Os  Pericardial effusion Echocardiogram obtained today revealed moderate pericardial effusion.  Autoimmune work-up is pending, per night team patient mentioned being told she has a history of lupus, not confirmed.  Per chart review, no history of such diagnosis.  Surgical intervention not indicated at this time.  Patient is currently in  HF exacerbation and anemic, patient is being diuresed and iron repletion started.  Possible underlying cause unknown at this time.  However, patient is hemodynamically stable.  Will continue to monitor with serial examinations and echocardiograms. --Consult cardiology if pericardial effusion worsens or patient begins to clinically deteriorate  Secondary hypothyroidism Per chart review patient has a history of taking PTU for hyperthyroidism.  Patient has no history of thyroidectomy.  Patient became hypothyroid following PTU treatment.  Patient previously takes T3 over-the-counter.  Thyroid function obtained on admission: TSH 12.7 and T4 0.3.  On exam, proptosis noted.  No enlargement of the neck.  We will initiate a low-dose thyroid supplementation.  Low suspicion for myxedema, vitals stable. --Synthroid 50 mcg daily  Hypertension On admission, patient was hypertensive SBP 150s to 200s.  Patient did receive a dose of IV Lasix.  Currently, blood pressure still elevated SBP 160s-170s.  Patient does not take any hypertensive medications at home.  Consider initiating ACEi/ARB and/or beta-blocker.  Resolved, patient highly resistant to taking medications. Patient's creatinine level elevated at 1.15; baseline appears to be 1.1-1.2.  We will continue to diurese in the setting of HF exacerbation and monitor creatinine levels.  Currently being diuresed with lasix, will monitor for blood pressure improvement.    Prior to Admission Living Arrangement: Anticipated Discharge Location: Barriers to Discharge: Dispo: Anticipated discharge in approximately 1-2 day(s).   Timothy Lasso, MD 11/19/2021, 11:30 AM Pager: [phone] After 5pm on weekdays and 1pm on weekends: On Call pager [phone]

## 2021-11-19 NOTE — Progress Notes (Signed)
°  Echocardiogram 2D Echocardiogram has been performed.  Cynthia Hill 11/19/2021, 8:49 AM

## 2021-11-19 NOTE — Progress Notes (Signed)
Pt with labored breathing and HTN. Most recent check BP = 184/89 (map = 118). O2 sats = 100 % on 4 liters. C/O feeling like she can not get a deep breath.  Lasix 40 mg IV given. Mild exp wheeze and crackles noted. Her anxiety is increasing. Message sent by Shea Evans to Resident coverage.

## 2021-11-20 LAB — BPAM RBC
Blood Product Expiration Date: 202303162359
Blood Product Expiration Date: 202303162359
Blood Product Expiration Date: 202303222359
ISSUE DATE / TIME: 202302171830
ISSUE DATE / TIME: 202302172127
ISSUE DATE / TIME: 202302180014
Unit Type and Rh: 5100
Unit Type and Rh: 5100
Unit Type and Rh: 5100

## 2021-11-20 LAB — CBC WITH DIFFERENTIAL/PLATELET
Abs Immature Granulocytes: 0.08 10*3/uL — ABNORMAL HIGH (ref 0.00–0.07)
Basophils Absolute: 0 10*3/uL (ref 0.0–0.1)
Basophils Relative: 0 %
Eosinophils Absolute: 0.2 10*3/uL (ref 0.0–0.5)
Eosinophils Relative: 2 %
HCT: 26.1 % — ABNORMAL LOW (ref 36.0–46.0)
Hemoglobin: 7.4 g/dL — ABNORMAL LOW (ref 12.0–15.0)
Immature Granulocytes: 1 %
Lymphocytes Relative: 7 %
Lymphs Abs: 0.6 10*3/uL — ABNORMAL LOW (ref 0.7–4.0)
MCH: 21.3 pg — ABNORMAL LOW (ref 26.0–34.0)
MCHC: 28.4 g/dL — ABNORMAL LOW (ref 30.0–36.0)
MCV: 75 fL — ABNORMAL LOW (ref 80.0–100.0)
Monocytes Absolute: 0.4 10*3/uL (ref 0.1–1.0)
Monocytes Relative: 5 %
Neutro Abs: 7.6 10*3/uL (ref 1.7–7.7)
Neutrophils Relative %: 85 %
Platelets: 179 10*3/uL (ref 150–400)
RBC: 3.48 MIL/uL — ABNORMAL LOW (ref 3.87–5.11)
RDW: 22.5 % — ABNORMAL HIGH (ref 11.5–15.5)
WBC: 8.9 10*3/uL (ref 4.0–10.5)
nRBC: 0 % (ref 0.0–0.2)

## 2021-11-20 LAB — BASIC METABOLIC PANEL
Anion gap: 10 (ref 5–15)
BUN: 12 mg/dL (ref 8–23)
CO2: 27 mmol/L (ref 22–32)
Calcium: 8.8 mg/dL — ABNORMAL LOW (ref 8.9–10.3)
Chloride: 101 mmol/L (ref 98–111)
Creatinine, Ser: 1.2 mg/dL — ABNORMAL HIGH (ref 0.44–1.00)
GFR, Estimated: 49 mL/min — ABNORMAL LOW (ref >60)
Glucose, Bld: 91 mg/dL (ref 70–99)
Potassium: 2.9 mmol/L — ABNORMAL LOW (ref 3.5–5.1)
Sodium: 138 mmol/L (ref 135–145)

## 2021-11-20 LAB — TYPE AND SCREEN
ABO/RH(D): O POS
Antibody Screen: POSITIVE
Donor AG Type: NEGATIVE
Donor AG Type: NEGATIVE
Donor AG Type: NEGATIVE
PT AG Type: NEGATIVE
Unit division: 0
Unit division: 0
Unit division: 0

## 2021-11-20 LAB — MAGNESIUM: Magnesium: 1.9 mg/dL (ref 1.7–2.4)

## 2021-11-20 LAB — POTASSIUM: Potassium: 2.9 mmol/L — ABNORMAL LOW (ref 3.5–5.1)

## 2021-11-20 MED ORDER — SENNA 8.6 MG PO TABS
1.0000 | ORAL_TABLET | Freq: Every day | ORAL | Status: DC
  Administered 2021-11-20 – 2021-11-24 (×5): 8.6 mg via ORAL
  Filled 2021-11-20 (×5): qty 1

## 2021-11-20 MED ORDER — POTASSIUM CHLORIDE CRYS ER 20 MEQ PO TBCR: 40.0000 meq | EXTENDED_RELEASE_TABLET | Freq: Every day | ORAL | Status: DC

## 2021-11-20 MED ORDER — MAGNESIUM SULFATE 2 GM/50ML IV SOLN
2.0000 g | Freq: Once | INTRAVENOUS | Status: AC
  Administered 2021-11-21: 2 g via INTRAVENOUS
  Filled 2021-11-20: qty 50

## 2021-11-20 MED ORDER — SODIUM CHLORIDE 0.9 % IV SOLN
510.0000 mg | Freq: Once | INTRAVENOUS | Status: AC
  Administered 2021-11-22: 510 mg via INTRAVENOUS
  Filled 2021-11-20: qty 17

## 2021-11-20 MED ORDER — POTASSIUM CHLORIDE 10 MEQ/100ML IV SOLN
10.0000 meq | INTRAVENOUS | Status: AC
  Administered 2021-11-20 – 2021-11-21 (×4): 10 meq via INTRAVENOUS
  Filled 2021-11-20 (×4): qty 100

## 2021-11-20 MED ORDER — POTASSIUM CHLORIDE CRYS ER 20 MEQ PO TBCR
40.0000 meq | EXTENDED_RELEASE_TABLET | Freq: Two times a day (BID) | ORAL | Status: DC
  Administered 2021-11-20 – 2021-11-24 (×9): 40 meq via ORAL
  Filled 2021-11-20 (×9): qty 2

## 2021-11-20 NOTE — Progress Notes (Signed)
Paged on call MD about pts elevated BP. Last two had systolics in 838F-840R. Pt has been refusing tx for her HTN do to her distrust with medications. On call resident advised to continue to monitor and call back in systolics remain in 754H-606V. Pts baseline is systolics in 703E-035C. Will continue to monitor pt.

## 2021-11-20 NOTE — Progress Notes (Signed)
CSW acknowledges consult for SNF/HH. The patient will require PT/OT evaluations. TOC will assist with disposition planning once the evaluations have been completed.  °  °TOC will continue to follow.    °

## 2021-11-20 NOTE — Progress Notes (Addendum)
HD#2 Subjective:  Overnight Events: No acute events overnight  Patient resting in bed comfortably no acute distress.  She feels as though her breathing is gotten better but at times she has occultly taking a deep breath.  Denies any episodes of vaginal bleeding.  Objective:  Vital signs in last 24 hours: Vitals:   11/19/21 2123 11/19/21 2232 11/20/21 0007 11/20/21 0310  BP: (!) 182/70 (!) 158/75 (!) 162/88 (!) 156/72  Pulse:  78  70  Resp:   (!) 21 (!) 21  Temp:   98 F (36.7 C) 98.7 F (37.1 C)  TempSrc:   Oral Oral  SpO2:    100%  Weight:    92.2 kg  Height:       Supplemental O2: 100% on 3 L nasal cannula.  Turned oxygen off, patient saturating in the 90%  Physical Exam:  Constitutional: Well-appearing in no acute distress HENT: normocephalic atraumatic.  Exophthalmos Eyes: conjunctiva non-erythematous Neck: supple Cardiovascular: Distant heart sounds.  Regular rate and rhythm. Pulmonary/Chest: normal work of breathing on room air, decreased breath sounds bilaterally.  Patient with difficulty taking a deep breath Abdominal: soft, non-tender, non-distended MSK: normal bulk and tone Neurological: alert & oriented x 3 Skin: warm and dry. Lower extremities 2+ edema with bilateral discoloration Psych: Anxious appearing  Filed Weights   11/18/21 1556 11/19/21 0338 11/20/21 0310  Weight: 95 kg 98.8 kg 92.2 kg     Intake/Output Summary (Last 24 hours) at 11/20/2021 0727 Last data filed at 11/20/2021 0527 Gross per 24 hour  Intake 770 ml  Output 2125 ml  Net -1355 ml   Net IO Since Admission: -2,924 mL [11/20/21 0727]  Pertinent Labs: CBC Latest Ref Rng & Units 11/20/2021 11/19/2021 11/19/2021  WBC 4.0 - 10.5 K/uL 8.9 10.4 -  Hemoglobin 12.0 - 15.0 g/dL 7.4(L) 8.7(L) 7.5(L)  Hematocrit 36.0 - 46.0 % 26.1(L) 30.6(L) 25.7(L)  Platelets 150 - 400 K/uL 179 193 -    CMP Latest Ref Rng & Units 11/20/2021 11/19/2021 11/18/2021  Glucose 70 - 99 mg/dL 91 101(H) 138(H)  BUN  8 - 23 mg/dL 12 13 10   Creatinine 0.44 - 1.00 mg/dL 1.20(H) 1.19(H) 1.15(H)  Sodium 135 - 145 mmol/L 138 137 138  Potassium 3.5 - 5.1 mmol/L 2.9(L) 3.0(L) 3.4(L)  Chloride 98 - 111 mmol/L 101 102 106  CO2 22 - 32 mmol/L 27 24 24   Calcium 8.9 - 10.3 mg/dL 8.8(L) 9.2 8.7(L)  Total Protein 6.5 - 8.1 g/dL - - 6.1(L)  Total Bilirubin 0.3 - 1.2 mg/dL - - 0.8  Alkaline Phos 38 - 126 U/L - - 60  AST 15 - 41 U/L - - 25  ALT 0 - 44 U/L - - 14    Imaging: ECHOCARDIOGRAM COMPLETE  Result Date: 11/19/2021    ECHOCARDIOGRAM REPORT   Patient Name:   GAYNA BRADDY Date of Exam: 11/19/2021 Medical Rec #:  [id number]         Height:       60.0 in Accession #:    [id number]        Weight:       217.8 lb Date of Birth:  03-Feb-1951         BSA:          1.936 m Patient Age:    71 years          BP:           176/75 mmHg Patient Gender: F  HR:           71 bpm. Exam Location:  Inpatient Procedure: 2D Echo, Cardiac Doppler and Color Doppler STAT ECHO Indications:    Dyspnea  History:        Patient has prior history of Echocardiogram examinations, most                 recent 12/13/2020. CHF, Signs/Symptoms:Dyspnea and Shortness of                 Breath; Risk Factors:Hypertension.  Sonographer:    Bernadene Person RDCS Referring Phys: [id number] ADAM CURATOLO  Sonographer Comments: Image acquisition challenging due to respiratory motion. Pt sitting up during exam due to dyspnea. IMPRESSIONS  1. Left ventricular ejection fraction, by estimation, is 60 to 65%. The left ventricle has normal function. The left ventricle has no regional wall motion abnormalities. There is mild left ventricular hypertrophy. Left ventricular diastolic parameters are consistent with Grade II diastolic dysfunction (pseudonormalization). Elevated left atrial pressure.  2. Right ventricular systolic function is normal. The right ventricular size is normal. There is moderately elevated pulmonary artery systolic pressure.  3. Left atrial  size was mildly dilated.  4. Moderate pericardial effusion. The pericardial effusion is circumferential. There is no evidence of cardiac tamponade.  5. Very eccentric anterior directed MR difficult to quantify. The posterior leaflet is poorly visualized but the course of the jet would suggest some degree of posterior leaflet prolapse. Probable at least moderate MR, could consider TEE to better quantify MR. . The mitral valve is abnormal. At least moderate MR mitral valve regurgitation. No evidence of mitral stenosis.  6. The aortic valve is tricuspid. There is moderate calcification of the aortic valve. There is moderate thickening of the aortic valve. Aortic valve regurgitation is not visualized. Mild aortic valve stenosis.  7. Aortic dilatation noted. There is mild dilatation of the ascending aorta, measuring 41 mm.  8. The inferior vena cava is dilated in size with <50% respiratory variability, suggesting right atrial pressure of 15 mmHg. FINDINGS  Left Ventricle: Left ventricular ejection fraction, by estimation, is 60 to 65%. The left ventricle has normal function. The left ventricle has no regional wall motion abnormalities. The left ventricular internal cavity size was normal in size. There is  mild left ventricular hypertrophy. Left ventricular diastolic parameters are consistent with Grade II diastolic dysfunction (pseudonormalization). Elevated left atrial pressure. Right Ventricle: The right ventricular size is normal. Right vetricular wall thickness was not well visualized. Right ventricular systolic function is normal. There is moderately elevated pulmonary artery systolic pressure. The tricuspid regurgitant velocity is 3.33 m/s, and with an assumed right atrial pressure of 15 mmHg, the estimated right ventricular systolic pressure is 24.4 mmHg. Left Atrium: Left atrial size was mildly dilated. Right Atrium: Right atrial size was normal in size. Pericardium: There is no evidence of tamponade physiology.  RA indendation but no collapse. No RV collapse. Respiratory variation is 11% not signfiicant. A moderately sized pericardial effusion is present. The pericardial effusion is circumferential. There is no evidence of cardiac tamponade. Mitral Valve: Very eccentric anterior directed MR difficult to quantify. The posterior leaflet is poorly visualized but the course of the jet would suggest some degree of posterior leaflet prolapse. Probable at least moderate MR, could consider TEE to better quantify MR. The mitral valve is abnormal. There is mild calcification of the mitral valve leaflet(s). Mild mitral annular calcification. At least moderate MR mitral valve regurgitation. No evidence of mitral valve stenosis.  Tricuspid Valve: The tricuspid valve is not well visualized. Tricuspid valve regurgitation is mild . No evidence of tricuspid stenosis. Aortic Valve: The aortic valve is tricuspid. There is moderate calcification of the aortic valve. There is moderate thickening of the aortic valve. There is moderate aortic valve annular calcification. Aortic valve regurgitation is not visualized. Mild aortic stenosis is present. Aortic valve mean gradient measures 11.3 mmHg. Aortic valve peak gradient measures 23.1 mmHg. Aortic valve area, by VTI measures 1.84 cm. Pulmonic Valve: The pulmonic valve was not well visualized. Pulmonic valve regurgitation is not visualized. No evidence of pulmonic stenosis. Aorta: The aortic root is normal in size and structure and aortic dilatation noted. There is mild dilatation of the ascending aorta, measuring 41 mm. Venous: The inferior vena cava is dilated in size with less than 50% respiratory variability, suggesting right atrial pressure of 15 mmHg. IAS/Shunts: The interatrial septum was not well visualized.  LEFT VENTRICLE PLAX 2D LVIDd:         5.50 cm      Diastology LVIDs:         3.10 cm      LV e' medial:    4.68 cm/s LV PW:         1.10 cm      LV E/e' medial:  33.8 LV IVS:         1.10 cm      LV e' lateral:   6.00 cm/s LVOT diam:     2.10 cm      LV E/e' lateral: 26.3 LV SV:         100 LV SV Index:   52 LVOT Area:     3.46 cm  LV Volumes (MOD) LV vol d, MOD A2C: 124.0 ml LV vol d, MOD A4C: 101.0 ml LV vol s, MOD A2C: 37.5 ml LV vol s, MOD A4C: 36.3 ml LV SV MOD A2C:     86.5 ml LV SV MOD A4C:     101.0 ml LV SV MOD BP:      81.8 ml RIGHT VENTRICLE RV S prime:     13.60 cm/s TAPSE (M-mode): 2.0 cm LEFT ATRIUM             Index        RIGHT ATRIUM           Index LA diam:        4.20 cm 2.17 cm/m   RA Area:     13.50 cm LA Vol (A2C):   67.8 ml 35.03 ml/m  RA Volume:   27.40 ml  14.16 ml/m LA Vol (A4C):   56.1 ml 28.98 ml/m LA Biplane Vol: 64.7 ml 33.43 ml/m  AORTIC VALVE AV Area (Vmax):    2.00 cm AV Area (Vmean):   2.06 cm AV Area (VTI):     1.84 cm AV Vmax:           240.45 cm/s AV Vmean:          152.597 cm/s AV VTI:            0.543 m AV Peak Grad:      23.1 mmHg AV Mean Grad:      11.3 mmHg LVOT Vmax:         139.00 cm/s LVOT Vmean:        90.700 cm/s LVOT VTI:          0.289 m LVOT/AV VTI ratio: 0.53  AORTA Ao Root diam: 3.30 cm Ao  Asc diam:  4.10 cm MITRAL VALVE                TRICUSPID VALVE MV Area (PHT): 3.72 cm     TR Peak grad:   44.4 mmHg MV Area VTI:   2.57 cm     TR Vmax:        333.00 cm/s MV VTI:        0.39 m MV Decel Time: 204 msec     SHUNTS MR PISA:        1.01 cm    Systemic VTI:  0.29 m MR PISA Radius: 0.40 cm     Systemic Diam: 2.10 cm MV E velocity: 158.00 cm/s MV A velocity: 96.70 cm/s MV E/A ratio:  1.63 Carlyle Dolly MD Electronically signed by Carlyle Dolly MD Signature Date/Time: 11/19/2021/10:41:00 AM    Final     Assessment/Plan:   Principal Problem:   Symptomatic anemia Active Problems:   Acute diastolic CHF (congestive heart failure) (HCC)   HTN (hypertension)   Pericardial effusion   Uterine mass   Patient Summary: Quinta Eimer is a 71 y.o. living with a history of hypothyroidism, HFpEF, history of pericardial effusion  secondary to heart failure, iron deficiency anemia secondary to postmenopausal vaginal bleeding who presented with shortness of breath and admitted for symptomatic anemia, heart failure exacerbation.  She has received 3 units of PPR C's and is diuresing well with improvement in her shortness of breath.  Symptomatic iron deficiency anemia Acute hypoxic respiratory failure Hemoglobin of 7.4 today from 8.7.  She received 3 units of PRBCs on 11/18/2021 and 1 dose of Feraheme yesterday.  It is suspected that her anemia is from postmenopausal vaginal bleeding however she has not had a colonoscopy.  With her stable pericardial effusion low suspicion that she is bleeding into her pericardial space.  Patient no longer requiring supplemental oxygen, saturating well on room air.  Suspect that treating her anemia as well as heart failure exacerbation will both assist in improving her respiratory status.  VBG yesterday showed no abnormalities. -Daily CBC -Transfuse if hemoglobin under 7. -Next Feraheme dose in 2 to 3 days -Follow-up outpatient OB/GYN and GI for colonoscopy  Acute on chronic HFpEF exacerbation Weight down 7 kg.  -2 L I/O overnight.  She is diuresing well with creatinine at baseline.  In the past when she was admitted and had to be diuresed she did have significant electrolyte abnormalities that prevented aggressive diuresis.  We will monitor electrolytes closely and continue to diurese with IV Lasix.  Suspect her exacerbation is secondary to medical therapy for heart failure as well as her anemia. -Continue IV Lasix 40 mg twice daily -Daily weights and I's and O's -Check electrolytes daily, keep potassium above 4 and mag above 2 -Consider medical therapy at discharge.  Patient has been resistant in the past secondary to her many medical allergies and followings of [place] medicine  Moderate pericardial effusion Moderate pericardial effusion seen on echocardiogram yesterday.  No evidence of  tamponade on echocardiogram.  No evidence of JVD or pulses paradoxus on my examination.  Do not suspect pericarditis, no pleuritic pain.  No clear etiology but possibly secondary to her untreated thyroid disease.  Do not believe she is bleeding into the pericardial space -Continue to monitor and treat hypothyroidism as well as heart failure exacerbation -No clinical indication for pericardiocentesis -We will need follow-up echocardiogram for resolution  Hypothyroidism Suspect hypothyroidism secondary to burnout from untreated hyperthyroidism.  We will continue  with Synthroid treatment.  -Synthroid 50 mcg daily  Hypertension Patient continues to have fluctuations in her blood pressure.  She is uncertain if she would like to be treated with antihypertensives at this time.  We will continue to monitor and have ongoing discussions with the patient about treatment modalities aside from her diuretic. -Continue to monitor blood pressures -Lasix as per above  CKD 3A Creatinine of 1.20 appears to be her baseline.  Suspect this is secondary to her history of untreated hypertension.  We will continue to trend kidney function during her admission with [place] diuresing her. -Daily BMP  Diet: Heart Healthy/carb modified IVF: None,None VTE: SCDs Code: Full PT/OT recs: Not consulted TOC recs: None at this time Family Update: Staff attempted to call brother yesterday  Dispo: Anticipated discharge to Home in 2 to 3 days pending further  [facility] Resident PGY-2 Pager [phone] Please contact the on call pager after 5 pm and on weekends at [phone].

## 2021-11-21 DIAGNOSIS — I5031 Acute diastolic (congestive) heart failure: Secondary | ICD-10-CM | POA: Diagnosis not present

## 2021-11-21 DIAGNOSIS — D649 Anemia, unspecified: Secondary | ICD-10-CM | POA: Diagnosis not present

## 2021-11-21 DIAGNOSIS — N858 Other specified noninflammatory disorders of uterus: Secondary | ICD-10-CM | POA: Diagnosis not present

## 2021-11-21 DIAGNOSIS — I110 Hypertensive heart disease with heart failure: Secondary | ICD-10-CM | POA: Diagnosis not present

## 2021-11-21 LAB — BASIC METABOLIC PANEL
Anion gap: 8 (ref 5–15)
BUN: 11 mg/dL (ref 8–23)
CO2: 29 mmol/L (ref 22–32)
Calcium: 8.7 mg/dL — ABNORMAL LOW (ref 8.9–10.3)
Chloride: 98 mmol/L (ref 98–111)
Creatinine, Ser: 1.07 mg/dL — ABNORMAL HIGH (ref 0.44–1.00)
GFR, Estimated: 56 mL/min — ABNORMAL LOW (ref >60)
Glucose, Bld: 89 mg/dL (ref 70–99)
Potassium: 4.3 mmol/L (ref 3.5–5.1)
Sodium: 135 mmol/L (ref 135–145)

## 2021-11-21 LAB — CBC WITH DIFFERENTIAL/PLATELET
Abs Immature Granulocytes: 0.11 10*3/uL — ABNORMAL HIGH (ref 0.00–0.07)
Basophils Absolute: 0 10*3/uL (ref 0.0–0.1)
Basophils Relative: 0 %
Eosinophils Absolute: 0.3 10*3/uL (ref 0.0–0.5)
Eosinophils Relative: 5 %
HCT: 26.1 % — ABNORMAL LOW (ref 36.0–46.0)
Hemoglobin: 7.5 g/dL — ABNORMAL LOW (ref 12.0–15.0)
Immature Granulocytes: 2 %
Lymphocytes Relative: 11 %
Lymphs Abs: 0.8 10*3/uL (ref 0.7–4.0)
MCH: 21.6 pg — ABNORMAL LOW (ref 26.0–34.0)
MCHC: 28.7 g/dL — ABNORMAL LOW (ref 30.0–36.0)
MCV: 75.2 fL — ABNORMAL LOW (ref 80.0–100.0)
Monocytes Absolute: 0.3 10*3/uL (ref 0.1–1.0)
Monocytes Relative: 5 %
Neutro Abs: 5.4 10*3/uL (ref 1.7–7.7)
Neutrophils Relative %: 77 %
Platelets: 165 10*3/uL (ref 150–400)
RBC: 3.47 MIL/uL — ABNORMAL LOW (ref 3.87–5.11)
RDW: 22.9 % — ABNORMAL HIGH (ref 11.5–15.5)
Smear Review: NORMAL
WBC: 7 10*3/uL (ref 4.0–10.5)
nRBC: 0.4 % — ABNORMAL HIGH (ref 0.0–0.2)

## 2021-11-21 LAB — MAGNESIUM: Magnesium: 1.8 mg/dL (ref 1.7–2.4)

## 2021-11-21 MED ORDER — LOSARTAN POTASSIUM 25 MG PO TABS
25.0000 mg | ORAL_TABLET | Freq: Every day | ORAL | Status: DC
  Administered 2021-11-21 – 2021-11-24 (×4): 25 mg via ORAL
  Filled 2021-11-21 (×4): qty 1

## 2021-11-21 NOTE — Progress Notes (Signed)
Subjective: I seen and evaluated Cynthia Hill at bedside.  She states that she is feeling much better.  States improvement in her breathing since admission.  She does endorse some brain fog and a little confused about what brought her into the hospital.  Otherwise, she denies any complaints.  She is agreeable to initiating medications to help improve her blood pressure.  Objective:  Vital signs in last 24 hours: Vitals:   11/21/21 0433 11/21/21 0503 11/21/21 0508 11/21/21 0903  BP: 132/60 (!) 165/66 (!) 177/71 (!) 147/67  Pulse: 65 68 68 82  Resp:   16 18  Temp:   (!) 96.5 F (35.8 C)   TempSrc:   Axillary   SpO2: 100% 100% 100% 100%  Weight:      Height:       Physical Exam Constitutional:      General: She is awake.  Cardiovascular:     Rate and Rhythm: Normal rate and regular rhythm.     Heart sounds: Normal heart sounds.  Pulmonary:     Effort: Pulmonary effort is normal.     Breath sounds: Normal air entry. Examination of the right-upper field reveals wheezing. Examination of the left-lower field reveals wheezing. Wheezing present. No rales.  Musculoskeletal:     Right lower leg: 1+ Edema present.     Left lower leg: 1+ Edema present.     Comments: Bilateral lower extremity skin discoloration, erythema present  Skin:    General: Skin is warm and dry.  Neurological:     Mental Status: She is alert.  Psychiatric:        Behavior: Behavior is cooperative.     Assessment/Plan:  Principal Problem:   Symptomatic anemia Active Problems:   Acute diastolic CHF (congestive heart failure) (HCC)   HTN (hypertension)   Pericardial effusion   Uterine mass  Cynthia Hill is a 71 y.o. living with a history of hypothyroidism, HFpEF, history of pericardial effusion secondary to heart failure, iron deficiency anemia secondary to postmenopausal vaginal bleeding who presented with shortness of breath and admitted for symptomatic anemia, heart failure exacerbation  Acute  on chronic HFpEF exacerbation Overnight Net output 2.3L; total net output since admission 5.3L. Patient is saturating well o nRA, >97%. On exam, no rales auscultated, some wheezing at the lung bases. Patient reports improvement in her breathing. Patient is diuresing appropriately. Likely DC in the upcoming day or so. Can consider transitioning from IV to oral lasix daily. --Transition to oral Lasix tomorrow  Uncontrolled Hypertension Persistent HTN noted overnight; since admission, her BP fluctuating and now SBP ranging 170s-180s. Patient endorse no family hx of HTN. She states she does not recall ever being diagnosed with hypertension in the past. Given cardiac hx and lack of consistent medical care, patient likely had long standing HTN. Patient is asymptomatic and is agreeable to initiation of antihypertensive agent. Patient will benefit from ACEi/ARB. --Start losartan 25 mg daily --Monitor blood pressure  Hypothyroidism Patient is asymptomatic. Vitals stable, though her temperature is noted to decrease slightly, 96.5; which could very well be her baseline. Will continue to monitor. No indication to adjust dosing. --Synthroid 44mcg  Iron deficiency anemia Current Hgb stable at ~7.5. Patient is scheduled to receive second dose of Feraheme 11/22/21. Patient endorse less fatigue and states she continues to feel better since admission. Patient will need outpatient work up to ultimately determine source of blood loss. --Daily CBC --Transfuse if hemoglobin < 7 --Follow-up outpatient OB/GYN and GI for colonoscopy  CKD IIIa Per chart review, past renal function shows that her baseline appears to be around ~1.2 with a GFR <60. Currently patient is being diuresed with improvement in her creatinine levels, currently 1.01 improved from 1.2. --Trend BMP  Moderate pericardial effusion Stable.  No evidence of tamponade on echocardiogram.  No suspicion for worsening of fluid collection.  Patient's vital is  stable.  Hemoglobin has been stable since transfusion, low suspicion for bleeding into pericardial space at this time.  Likely contribution to etiology includes diagnoses of hypothyroidism as well as the heart failure exacerbation.  No clinical indication for pericardiocentesis at this time.  Patient will need follow-up echocardiogram outpatient. --Continue to monitor  Prior to Admission Living Arrangement: Anticipated Discharge Location: Barriers to Discharge: Dispo: Anticipated discharge in approximately 1-2 day(s).   Timothy Lasso, MD 11/21/2021, 10:41 AM Pager: [phone] After 5pm on weekdays and 1pm on weekends: On Call pager [phone]

## 2021-11-21 NOTE — Evaluation (Signed)
Occupational Therapy Evaluation Patient Details Name: Cynthia Hill MRN: [id number] DOB: 09-21-1951 Today's Date: 11/21/2021   History of Present Illness Pt adm 2/17 with symptomatic anemia and acute on chronic heart failure. Transfused 3 units of blood. Pt also with moderate pericardial effusion. PMH - chf, anemia, postmenopausal uterine bleeding, uterine mass, hypothyroidism   Clinical Impression   Pt reports independence with ADLs, uses rollator at baseline for mobility. Pt A & O x3, disoriented to place. Pt currently requiring min-max A for ADLs, and min guard for transfers with RW. Pt requiring increased cuing for hand placement and safety on RW. Pt presenting with impairments listed below, will follow. Recommend SNF at d/c.      Recommendations for follow up therapy are one component of a multi-disciplinary discharge planning process, led by the attending physician.  Recommendations may be updated based on patient status, additional functional criteria and insurance authorization.   Follow Up Recommendations  Skilled nursing-short term rehab (<3 hours/day)    Assistance Recommended at Discharge Intermittent Supervision/Assistance  Patient can return home with the following A little help with walking and/or transfers;A lot of help with bathing/dressing/bathroom;Assistance with cooking/housework;Help with stairs or ramp for entrance    Functional Status Assessment  Patient has had a recent decline in their functional status and demonstrates the ability to make significant improvements in function in a reasonable and predictable amount of time.  Equipment Recommendations  None recommended by OT;Other (comment) (defer to next venue of care)    Recommendations for Other Services PT consult     Precautions / Restrictions Precautions Precautions: Fall Restrictions Weight Bearing Restrictions: No      Mobility Bed Mobility               General bed mobility comments:  sitting EOB upon arrival    Transfers Overall transfer level: Needs assistance Equipment used: Rolling walker (2 wheels) Transfers: Sit to/from Stand, Bed to chair/wheelchair/BSC Sit to Stand: Min guard     Step pivot transfers: Min guard            Balance Overall balance assessment: Needs assistance Sitting-balance support: Feet supported Sitting balance-Leahy Scale: Fair     Standing balance support: During functional activity, Reliant on assistive device for balance, Single extremity supported Standing balance-Leahy Scale: Fair Standing balance comment: able to stand statically for pericare without LOB                           ADL either performed or assessed with clinical judgement   ADL Overall ADL's : Needs assistance/impaired Eating/Feeding: Set up;Sitting   Grooming: Set up;Sitting   Upper Body Bathing: Minimal assistance;Sitting   Lower Body Bathing: Maximal assistance;Sitting/lateral leans   Upper Body Dressing : Minimal assistance;Sitting Upper Body Dressing Details (indicate cue type and reason): for sleeves Lower Body Dressing: Maximal assistance;Sit to/from stand Lower Body Dressing Details (indicate cue type and reason): to don socks Toilet Transfer: Min guard;Rolling walker (2 wheels);BSC/3in1;Stand-pivot Toilet Transfer Details (indicate cue type and reason): cues needed for hand placement Toileting- Clothing Manipulation and Hygiene: Maximal assistance;Sitting/lateral lean       Functional mobility during ADLs: Min guard;Rolling walker (2 wheels);Cueing for safety;Cueing for sequencing       Vision   Vision Assessment?: No apparent visual deficits     Perception     Praxis      Pertinent Vitals/Pain Pain Assessment Pain Assessment: No/denies pain     Hand Dominance Right  Extremity/Trunk Assessment Upper Extremity Assessment Upper Extremity Assessment: Generalized weakness   Lower Extremity Assessment Lower  Extremity Assessment: Defer to PT evaluation   Cervical / Trunk Assessment Cervical / Trunk Assessment: Normal   Communication Communication Communication: No difficulties   Cognition Arousal/Alertness: Awake/alert Behavior During Therapy: WFL for tasks assessed/performed Overall Cognitive Status: No family/caregiver present to determine baseline cognitive functioning                                 General Comments: pt disoriented to place, requires increased cuing and is perseverative with tasks     General Comments  VSS on RA    Exercises     Shoulder Instructions      Home Living Family/patient expects to be discharged to:: Other (Comment) (ILF) Living Arrangements: Alone   Type of Home: Apartment Home Access: Level entry     Home Layout: One level     Bathroom Shower/Tub: Occupational psychologist: Standard     Home Equipment: Rollator (4 wheels)          Prior Functioning/Environment Prior Level of Function : Independent/Modified Independent             Mobility Comments: uses rollator ADLs Comments: reports independence with ADLs        OT Problem List: Decreased strength;Decreased range of motion;Decreased activity tolerance;Impaired balance (sitting and/or standing);Decreased knowledge of use of DME or AE;Decreased safety awareness      OT Treatment/Interventions: Self-care/ADL training;Therapeutic exercise;DME and/or AE instruction;Therapeutic activities;Patient/family education;Balance training    OT Goals(Current goals can be found in the care plan section) Acute Rehab OT Goals Patient Stated Goal: none stated OT Goal Formulation: With patient Time For Goal Achievement: 12/05/21 Potential to Achieve Goals: Good ADL Goals Pt Will Perform Upper Body Dressing: with supervision;sitting Pt Will Perform Lower Body Dressing: with mod assist;sit to/from stand;sitting/lateral leans Pt Will Transfer to Toilet: with  supervision;ambulating;bedside commode;stand pivot transfer  OT Frequency: Min 2X/week    Co-evaluation              AM-PAC OT "6 Clicks" Daily Activity     Outcome Measure Help from another person eating meals?: None Help from another person taking care of personal grooming?: A Little Help from another person toileting, which includes using toliet, bedpan, or urinal?: A Lot Help from another person bathing (including washing, rinsing, drying)?: A Lot Help from another person to put on and taking off regular upper body clothing?: A Little Help from another person to put on and taking off regular lower body clothing?: Total 6 Click Score: 15   End of Session Equipment Utilized During Treatment: Gait belt;Rolling walker (2 wheels) Nurse Communication: Mobility status  Activity Tolerance: Patient tolerated treatment well Patient left: in chair;with call bell/phone within reach;with chair alarm set (handoff to PT)  OT Visit Diagnosis: Unsteadiness on feet (R26.81);Other abnormalities of gait and mobility (R26.89);Muscle weakness (generalized) (M62.81)                Time: 1102-1130 OT Time Calculation (min): 28 min Charges:  OT General Charges $OT Visit: 1 Visit OT Evaluation $OT Eval Moderate Complexity: 1 Mod OT Treatments $Self Care/Home Management : 8-22 mins  Lynnda Child, OTD, OTR/L Acute Rehab [phone] [place] 11/21/2021, 4:45 PM

## 2021-11-21 NOTE — Progress Notes (Signed)
Patient's brother reached out to nursing staff that he would like an update.  Patient is okay with [place] talking with her brother.  I reached out to [place] and we had a long conversation about his sister and her current medical needs.  He is quite concerned that she will go home and refuses current medication regimen as well as modern medicine as a whole.  He is seeing her progress as she has refused medications.  We have a long discussion about how she is willing to except medications while in the hospital and that we talk with her each day about our concerns of her getting worse if she does not continue with the current regimen.  We discussed with her that [place] medicine homeopathic methods have their place however we feel as though the current treatment plan will be best for her moving forward.  Mikki Santee is in agreement with this and was appreciative of our conversation.

## 2021-11-21 NOTE — NC FL2 (Signed)
[place] LEVEL OF CARE SCREENING TOOL     IDENTIFICATION  Patient Name: Cynthia Hill Birthdate: 09/20/1951 Sex: female Admission Date (Current Location): 11/18/2021  [facility] and [place] Number:  Herbalist and Address:  [facility] [place], [place] [address] [place], [place] [address]      Provider Number: [id number]  Attending Physician Name and Address:  Lucious Groves, DO  Relative Name and Phone Number:  Shanon Ace, [phone]    Current Level of Care: Hospital Recommended Level of Care: [place] Prior Approval Number:    Date Approved/Denied:   PASRR Number: [id number]  Discharge Plan: SNF    Current Diagnoses: Patient Active Problem List   Diagnosis Date Noted   Pericardial effusion 12/18/2020   Uterine mass 12/18/2020   Symptomatic anemia 27/25/3664   Acute diastolic CHF (congestive heart failure) ([place]) 12/12/2020   HTN (hypertension) 12/12/2020   Hypokalemia 12/12/2020    Orientation RESPIRATION BLADDER Height & Weight     Self, Time, Situation, Place  O2 ( 3) Incontinent, External catheter Weight: 203 lb 4.8 oz (92.2 kg) Height:  5' (152.4 cm)  BEHAVIORAL SYMPTOMS/MOOD NEUROLOGICAL BOWEL NUTRITION STATUS      Continent Diet (See DC summary)  AMBULATORY STATUS COMMUNICATION OF NEEDS Skin   Limited Assist Verbally Skin abrasions (Erythema bilateral legs)                       Personal Care Assistance Level of Assistance  Bathing, Feeding, Dressing Bathing Assistance: Limited assistance Feeding assistance: Independent Dressing Assistance: Limited assistance     Functional Limitations Info  Sight, Hearing, Speech Sight Info: Impaired Hearing Info: Adequate Speech Info: Adequate    SPECIAL CARE FACTORS FREQUENCY  PT (By licensed PT), OT (By licensed OT)     PT Frequency: 5x week OT Frequency: 5x week            Contractures Contractures Info: Not present     Additional Factors Info  Code Status, Allergies Code Status Info: Full Allergies Info: Aspirin   Sulfa Antibiotics   Albuterol   Atenolol   Bactroban (Mupirocin)   Benzocaine   Bumex (Bumetanide)   Levaquin (Levofloxacin)   Morphine And Related   Other   Propranolol   Ptu (Propylthiouracil)   Spironolactone   Tapazole (Methimazole)   Tetracyclines & Related   Toprol Xl (Metoprolol)   Triamterene   Valium (Diazepam)           Current Medications (11/21/2021):  This is the current hospital active medication list Current Facility-Administered Medications  Medication Dose Route Frequency Provider Last Rate Last Admin   0.9 %  sodium chloride infusion  10 mL/hr Intravenous Once Lennice Sites, DO   Held at 11/18/21 1644   [START ON 11/22/2021] ferumoxytol (FERAHEME) 510 mg in sodium chloride 0.9 % 100 mL IVPB  510 mg Intravenous Once Katsadouros, Vasilios, MD       furosemide (LASIX) injection 40 mg  40 mg Intravenous BID Timothy Lasso, MD   40 mg at 11/21/21 0951   levothyroxine (SYNTHROID) tablet 50 mcg  50 mcg Oral Q0600 Timothy Lasso, MD   50 mcg at 11/21/21 0549   losartan (COZAAR) tablet 25 mg  25 mg Oral Daily Timothy Lasso, MD   25 mg at 11/21/21 1508   melatonin tablet 3 mg  3 mg Oral QHS At[place], Rayann N, DO   3 mg at 11/20/21 2211  potassium chloride SA (KLOR-CON M) CR tablet 40 mEq  40 mEq Oral BID Katsadouros, Vasilios, MD   40 mEq at 11/21/21 0948   senna (SENOKOT) tablet 8.6 mg  1 tablet Oral Daily Katsadouros, Vasilios, MD   8.6 mg at 11/21/21 6010     Discharge Medications: Please see discharge summary for a list of discharge medications.  Relevant Imaging Results:  Relevant Lab Results:   Additional Information SS# [place], [place]

## 2021-11-21 NOTE — TOC Initial Note (Signed)
Transition of Care [facility]) - Initial/Assessment Note    Patient Details  Name: Cynthia Hill MRN: [id number] Date of Birth: 04-Feb-1951  [facility]) CM/SW Contact:    Coralee Pesa, [place] Phone Number: 11/21/2021, 4:28 PM  Clinical Narrative:                 CSW met with pt and brother at bedside to discuss PT recommendation for SNF. They are agreeable, but request a 4 or 5 star facility. CSW advised that they will need to be faxed out further for those options and they are agreeable. Pt only wants to stay for short term and then would like to return home. They have no preference for specific facility and were given medicare.gov info. CSW will fax pt out for offers. TOC will continue to follow for DC needs.  Expected Discharge Plan: Skilled Nursing Facility Barriers to Discharge: SNF Pending bed offer, Insurance Authorization, Continued Medical Work up   Patient Goals and CMS Choice Patient states their goals for this hospitalization and ongoing recovery are:: Pt states she would like to be able to get stronger. CMS Medicare.gov Compare Post Acute Care list provided to:: Patient Choice offered to / list presented to : Patient, Sibling  Expected Discharge Plan and Services Expected Discharge Plan: [place] Acute Care Choice: [place] Living arrangements for the past 2 months: [place], [place]                                      Prior Living Arrangements/Services Living arrangements for the past 2 months: [place], [place] Lives with:: Self Patient language and need for interpreter reviewed:: Yes Do you feel safe going back to the place where you live?: Yes      Need for Family Participation in Patient Care: No (Comment) Care giver support system in place?: No (comment)   Criminal Activity/Legal Involvement Pertinent to Current Situation/Hospitalization:  No - Comment as needed  Activities of Daily Living Home Assistive Devices/Equipment: Cane (specify quad or straight), Shower chair with back, Eyeglasses, Environmental consultant (specify type), Wheelchair ADL Screening (condition at time of admission) Patient's cognitive ability adequate to safely complete daily activities?: Yes Is the patient deaf or have difficulty hearing?: Yes Does the patient have difficulty seeing, even when wearing glasses/contacts?: No Does the patient have difficulty concentrating, remembering, or making decisions?: No Patient able to express need for assistance with ADLs?: Yes Does the patient have difficulty dressing or bathing?: No Independently performs ADLs?: Yes (appropriate for developmental age) Does the patient have difficulty walking or climbing stairs?: Yes (SOB) Weakness of Legs: None Weakness of Arms/Hands: None  Permission Sought/Granted Permission sought to share information with : Family Supports Permission granted to share information with : Yes, Verbal Permission Granted  Share Information with NAME: Shanon Ace     Permission granted to share info w Relationship: Brother  Permission granted to share info w Contact Information: [phone]  Emotional Assessment Appearance:: Appears stated age Attitude/Demeanor/Rapport: Engaged Affect (typically observed): Appropriate Orientation: : Oriented to Self, Oriented to Place, Oriented to  Time, Oriented to Situation Alcohol / Substance Use: Not Applicable Psych Involvement: No (comment)  Admission diagnosis:  Symptomatic anemia [D64.9] Hypervolemia, unspecified hypervolemia type [E87.70] Patient Active Problem List   Diagnosis Date Noted   Pericardial effusion 12/18/2020   Uterine mass  12/18/2020   Symptomatic anemia 44/96/7591   Acute diastolic CHF (congestive heart failure) ([place]) 12/12/2020   HTN (hypertension) 12/12/2020   Hypokalemia 12/12/2020   PCP:  Pcp, No Pharmacy:   [facility] [place] [facility] [address] [place] [address] Phone: [phone] Fax: [phone]  [facility] [address] [place] [address] Phone: [phone] Fax: [phone]     Social Determinants of Health (SDOH) Interventions    Readmission Risk Interventions No flowsheet data found.

## 2021-11-21 NOTE — Evaluation (Signed)
Physical Therapy Evaluation Patient Details Name: Cynthia Hill MRN: [id number] DOB: 1951-03-06 Today's Date: 11/21/2021  History of Present Illness  Pt adm 2/17 with symptomatic anemia and acute on chronic heart failure. Transfused 3 units of blood. Pt also with moderate pericardial effusion. PMH - chf, anemia, postmenopausal uterine bleeding, uterine mass, hypothyroidism  Clinical Impression  Pt presents to PT with decreased mobility due to decreased strength, decreased balance, and decreased functional activity tolerance. Pt has little support at home  and currently requiring assist for functional mobility so will likely need SNF at DC.         Recommendations for follow up therapy are one component of a multi-disciplinary discharge planning process, led by the attending physician.  Recommendations may be updated based on patient status, additional functional criteria and insurance authorization.  Follow Up Recommendations Skilled nursing-short term rehab (<3 hours/day)    Assistance Recommended at Discharge Frequent or constant Supervision/Assistance  Patient can return home with the following  A little help with walking and/or transfers;Assist for transportation    Equipment Recommendations None recommended by PT  Recommendations for Other Services       Functional Status Assessment Patient has had a recent decline in their functional status and demonstrates the ability to make significant improvements in function in a reasonable and predictable amount of time.     Precautions / Restrictions Precautions Precautions: Fall Restrictions Weight Bearing Restrictions: No      Mobility  Bed Mobility               General bed mobility comments: Pt up in recliner    Transfers Overall transfer level: Needs assistance Equipment used: Rolling walker (2 wheels) Transfers: Sit to/from Stand Sit to Stand: Min guard           General transfer comment: Assist for  stability to rise.    Ambulation/Gait Ambulation/Gait assistance: Min assist Gait Distance (Feet): 60 Feet Assistive device: Rollator (4 wheels) Gait Pattern/deviations: Step-through pattern, Decreased step length - right, Decreased step length - left, Trunk flexed Gait velocity: decr Gait velocity interpretation: <1.31 ft/sec, indicative of household ambulator   General Gait Details: Assist for balance and support especially as pt fatigued. Pt wanted to lean forward and prop her forearms on rollator handles as she fatigued.  Stairs            Wheelchair Mobility    Modified Rankin (Stroke Patients Only)       Balance Overall balance assessment: Needs assistance Sitting-balance support: Feet supported Sitting balance-Leahy Scale: Fair     Standing balance support: During functional activity, Reliant on assistive device for balance, Single extremity supported Standing balance-Leahy Scale: Poor Standing balance comment: Pt required UE support for static standing                             Pertinent Vitals/Pain Pain Assessment Pain Assessment: No/denies pain    Home Living Family/patient expects to be discharged to:: Other (Comment) (ILF) Living Arrangements: Alone   Type of Home: Apartment Home Access: Level entry       Home Layout: One level Home Equipment: Rollator (4 wheels)      Prior Function Prior Level of Function : Independent/Modified Independent             Mobility Comments: uses rollator ADLs Comments: reports independence with ADLs     Hand Dominance   Dominant Hand: Right    Extremity/Trunk Assessment  Upper Extremity Assessment Upper Extremity Assessment: Defer to OT evaluation    Lower Extremity Assessment Lower Extremity Assessment: Generalized weakness    Cervical / Trunk Assessment Cervical / Trunk Assessment: Normal  Communication   Communication: No difficulties  Cognition Arousal/Alertness:  Awake/alert Behavior During Therapy: WFL for tasks assessed/performed Overall Cognitive Status: No family/caregiver present to determine baseline cognitive functioning                                 General Comments: pt disoriented to place, requires increased cuing and is perseverative with tasks        General Comments General comments (skin integrity, edema, etc.): VSS on RA    Exercises     Assessment/Plan    PT Assessment Patient needs continued PT services  PT Problem List Decreased strength;Decreased activity tolerance;Decreased balance;Decreased mobility;Obesity;Decreased cognition       PT Treatment Interventions DME instruction;Gait training;Functional mobility training;Therapeutic activities;Therapeutic exercise;Balance training;Patient/family education    PT Goals (Current goals can be found in the Care Plan section)  Acute Rehab PT Goals Patient Stated Goal: not stated PT Goal Formulation: With patient Time For Goal Achievement: 12/05/21 Potential to Achieve Goals: Good    Frequency Min 3X/week     Co-evaluation               AM-PAC PT "6 Clicks" Mobility  Outcome Measure Help needed turning from your back to your side while in a flat bed without using bedrails?: A Little Help needed moving from lying on your back to sitting on the side of a flat bed without using bedrails?: A Little Help needed moving to and from a bed to a chair (including a wheelchair)?: A Little Help needed standing up from a chair using your arms (e.g., wheelchair or bedside chair)?: A Little Help needed to walk in hospital room?: A Little Help needed climbing 3-5 steps with a railing? : Total 6 Click Score: 16    End of Session   Activity Tolerance: Patient limited by fatigue Patient left: in chair;with call bell/phone within reach;with chair alarm set;with nursing/sitter in room Nurse Communication: Mobility status PT Visit Diagnosis: Unsteadiness on feet  (R26.81);Other abnormalities of gait and mobility (R26.89);Muscle weakness (generalized) (M62.81)    Time: 3976-7341 PT Time Calculation (min) (ACUTE ONLY): 18 min   Charges:   PT Evaluation $PT Eval Moderate Complexity: [place] Pager [phone] Office [place] 11/21/2021, 2:06 PM

## 2021-11-22 DIAGNOSIS — E039 Hypothyroidism, unspecified: Secondary | ICD-10-CM | POA: Diagnosis not present

## 2021-11-22 DIAGNOSIS — I5031 Acute diastolic (congestive) heart failure: Secondary | ICD-10-CM | POA: Diagnosis not present

## 2021-11-22 DIAGNOSIS — D649 Anemia, unspecified: Secondary | ICD-10-CM | POA: Diagnosis not present

## 2021-11-22 LAB — CBC WITH DIFFERENTIAL/PLATELET
Abs Immature Granulocytes: 0.1 10*3/uL — ABNORMAL HIGH (ref 0.00–0.07)
Basophils Absolute: 0 10*3/uL (ref 0.0–0.1)
Basophils Relative: 1 %
Eosinophils Absolute: 0.4 10*3/uL (ref 0.0–0.5)
Eosinophils Relative: 6 %
HCT: 26.6 % — ABNORMAL LOW (ref 36.0–46.0)
Hemoglobin: 7.5 g/dL — ABNORMAL LOW (ref 12.0–15.0)
Immature Granulocytes: 1 %
Lymphocytes Relative: 11 %
Lymphs Abs: 0.8 10*3/uL (ref 0.7–4.0)
MCH: 21.6 pg — ABNORMAL LOW (ref 26.0–34.0)
MCHC: 28.2 g/dL — ABNORMAL LOW (ref 30.0–36.0)
MCV: 76.7 fL — ABNORMAL LOW (ref 80.0–100.0)
Monocytes Absolute: 0.4 10*3/uL (ref 0.1–1.0)
Monocytes Relative: 5 %
Neutro Abs: 5.6 10*3/uL (ref 1.7–7.7)
Neutrophils Relative %: 76 %
Platelets: 173 10*3/uL (ref 150–400)
RBC: 3.47 MIL/uL — ABNORMAL LOW (ref 3.87–5.11)
RDW: 23.6 % — ABNORMAL HIGH (ref 11.5–15.5)
WBC: 7.4 10*3/uL (ref 4.0–10.5)
nRBC: 0.3 % — ABNORMAL HIGH (ref 0.0–0.2)

## 2021-11-22 LAB — BASIC METABOLIC PANEL
Anion gap: 7 (ref 5–15)
BUN: 9 mg/dL (ref 8–23)
CO2: 34 mmol/L — ABNORMAL HIGH (ref 22–32)
Calcium: 8.7 mg/dL — ABNORMAL LOW (ref 8.9–10.3)
Chloride: 97 mmol/L — ABNORMAL LOW (ref 98–111)
Creatinine, Ser: 1.02 mg/dL — ABNORMAL HIGH (ref 0.44–1.00)
GFR, Estimated: 60 mL/min — ABNORMAL LOW (ref >60)
Glucose, Bld: 80 mg/dL (ref 70–99)
Potassium: 3.1 mmol/L — ABNORMAL LOW (ref 3.5–5.1)
Sodium: 138 mmol/L (ref 135–145)

## 2021-11-22 LAB — MAGNESIUM: Magnesium: 1.8 mg/dL (ref 1.7–2.4)

## 2021-11-22 LAB — ANA W/REFLEX IF POSITIVE: Anti Nuclear Antibody (ANA): NEGATIVE

## 2021-11-22 LAB — GLUCOSE, CAPILLARY
Glucose-Capillary: 76 mg/dL (ref 70–99)
Glucose-Capillary: 87 mg/dL (ref 70–99)
Glucose-Capillary: 125 mg/dL — ABNORMAL HIGH (ref 70–99)

## 2021-11-22 MED ORDER — FUROSEMIDE 40 MG PO TABS
40.0000 mg | ORAL_TABLET | Freq: Every day | ORAL | Status: DC
  Administered 2021-11-22 – 2021-11-24 (×3): 40 mg via ORAL
  Filled 2021-11-22 (×3): qty 1

## 2021-11-22 MED ORDER — HYDRALAZINE HCL 20 MG/ML IJ SOLN: 5.0000 mg | INTRAMUSCULAR | Status: DC | PRN

## 2021-11-22 MED ORDER — MAGNESIUM SULFATE 2 GM/50ML IV SOLN
2.0000 g | Freq: Once | INTRAVENOUS | Status: AC
  Administered 2021-11-22: 2 g via INTRAVENOUS
  Filled 2021-11-22: qty 50

## 2021-11-22 NOTE — TOC Progression Note (Addendum)
Transition of Care [facility]) - Progression Note    Patient Details  Name: Cynthia Hill MRN: [id number] Date of Birth: [birth year]  Transition of Care [facility]) CM/SW [place], [place] Phone Number: 11/22/2021, 4:18 PM  Clinical Narrative:     Update- Bryson Ha with [facility] confirmed she can accept patient for SNF placement when medically ready. Insurance authorization is pending. CSW will continue to follow.  Update- Patients brother accepted SNF bed offer with [facility]. CSW awaiting callback from Bryson Ha to confirm if she can offer SNF bed for patient. Insurance authorization is pending.  Update [facility] could not offer. CSW LVM for Bryson Ha with [facility] to confirm SNF bed for patient.CSW awaiting callback.  CSW started insurance authorization for patient reference number # [id number]. CSW to follow up with patients brother on SNF choice in the morning. Patients brother is reviewing SNF bed offers.  [facility] has no beds [place] could not offer CSW awaiting callback from [facility] in [place].  CSW will continue to follow.   Expected Discharge Plan: [place] Barriers to Discharge: SNF Pending bed offer, Ship broker, Continued Medical Work up  Expected Discharge Plan and Services Expected Discharge Plan: [place] Choice: [place] arrangements for the past 2 months: Single Family Home, [place]                                       Social Determinants of Health (SDOH) Interventions    Readmission Risk Interventions No flowsheet data found.

## 2021-11-22 NOTE — Progress Notes (Signed)
Physical Therapy Treatment Patient Details Name: Cynthia Hill MRN: [id number] DOB: 1950/11/11 Today's Date: 11/22/2021   History of Present Illness Pt adm 2/17 with symptomatic anemia and acute on chronic heart failure. Transfused 3 units of blood. Pt also with moderate pericardial effusion. PMH - chf, anemia, postmenopausal uterine bleeding, uterine mass, hypothyroidism    PT Comments    Pt remains with confusion and limited awareness of safety. Pt with nasal cannula on but O2 off on arrival and pt maintained 92-99% on RA throughout activity. Pt with slowly improving gait but limited in mobility by frequent voiding. Pt appropriate for SNF and ALF long term would be appropriate given pt need for supervision and assist with iADLS. Will continue to follow.     Recommendations for follow up therapy are one component of a multi-disciplinary discharge planning process, led by the attending physician.  Recommendations may be updated based on patient status, additional functional criteria and insurance authorization.  Follow Up Recommendations  Skilled nursing-short term rehab (<3 hours/day)     Assistance Recommended at Discharge Frequent or constant Supervision/Assistance  Patient can return home with the following A little help with walking and/or transfers;Assist for transportation;A little help with bathing/dressing/bathroom   Equipment Recommendations  None recommended by PT    Recommendations for Other Services       Precautions / Restrictions Precautions Precautions: Fall;Other (comment) Precaution Comments: incontinent     Mobility  Bed Mobility Overal bed mobility: Modified Independent             General bed mobility comments: HOB 20 degrees with rail and increased time    Transfers Overall transfer level: Needs assistance   Transfers: Sit to/from Stand Sit to Stand: Min guard Stand pivot transfers: Min guard         General transfer comment: cues for  hand placement and safety to stand from bed and BSC x 4 trials. Assist for pericare and cues to pull underwear up after toileting. Pivot bed>BSC, BSC>recliner    Ambulation/Gait Ambulation/Gait assistance: Min guard Gait Distance (Feet): 100 Feet Assistive device: Rolling walker (2 wheels) Gait Pattern/deviations: Step-through pattern, Decreased stride length Gait velocity: 4.5 min to walk 80 ft= .296 ft/sec Gait velocity interpretation: <1.31 ft/sec, indicative of household ambulator   General Gait Details: cues for posture and proximity to RW with pt sats 92-99% on RA. Limited by fatigue   Stairs             Wheelchair Mobility    Modified Rankin (Stroke Patients Only)       Balance Overall balance assessment: Needs assistance   Sitting balance-Leahy Scale: Fair     Standing balance support: During functional activity, Reliant on assistive device for balance, Single extremity supported Standing balance-Leahy Scale: Fair Standing balance comment: able to stand without UE support for pericare, RW for gait                            Cognition Arousal/Alertness: Awake/alert Behavior During Therapy: WFL for tasks assessed/performed Overall Cognitive Status: Impaired/Different from baseline Area of Impairment: Following commands, Safety/judgement                       Following Commands: Follows one step commands with increased time, Follows one step commands consistently Safety/Judgement: Decreased awareness of safety, Decreased awareness of deficits     General Comments: disoriented to time, unable to recall room number, unaware gown and bed  wet        Exercises      General Comments        Pertinent Vitals/Pain Pain Assessment Pain Assessment: 0-10 Pain Score: 3  Pain Location: right flank Pain Descriptors / Indicators: Aching Pain Intervention(s): Limited activity within patient's tolerance, Monitored during session, Repositioned     Home Living                          Prior Function            PT Goals (current goals can now be found in the care plan section) Progress towards PT goals: Progressing toward goals    Frequency    Min 3X/week      PT Plan Current plan remains appropriate    Co-evaluation              AM-PAC PT "6 Clicks" Mobility   Outcome Measure  Help needed turning from your back to your side while in a flat bed without using bedrails?: A Little Help needed moving from lying on your back to sitting on the side of a flat bed without using bedrails?: A Little Help needed moving to and from a bed to a chair (including a wheelchair)?: A Little Help needed standing up from a chair using your arms (e.g., wheelchair or bedside chair)?: A Little Help needed to walk in hospital room?: A Little Help needed climbing 3-5 steps with a railing? : Total 6 Click Score: 16    End of Session Equipment Utilized During Treatment: Gait belt Activity Tolerance: Patient limited by fatigue Patient left: in chair;with call bell/phone within reach;with chair alarm set Nurse Communication: Mobility status PT Visit Diagnosis: Unsteadiness on feet (R26.81);Other abnormalities of gait and mobility (R26.89);Muscle weakness (generalized) (M62.81)     Time: 6269-4854 PT Time Calculation (min) (ACUTE ONLY): 46 min  Charges:  $Gait Training: 8-22 mins $Therapeutic Activity: 23-37 mins                     Mouhamadou Gittleman P, PT Acute Rehabilitation Services Pager: [phone] Office: [place] 11/22/2021, 1:21 PM

## 2021-11-22 NOTE — TOC Progression Note (Addendum)
Transition of Care [facility]) - Progression Note    Patient Details  Name: Cynthia Hill MRN: [id number] Date of Birth: 1951-03-06  Transition of Care [facility]) CM/SW Contact  Coralee Pesa, [place] Phone Number: 11/22/2021, 11:31 AM  Clinical Narrative:    3:45 [facility] are currently reviewing referral's. Pt is medically ready, Josem Kaufmann has been started without facility. CSW will follow up with [facility] admissions Neoma Laming and [facility] admissions Diane to determine if they can accept. If unable family is agreeable to [facility] rehab. TOC will continue to follow for DC needs. 1:00 Brother reviewing [facility] rehab  CSW provided bed offers to pt's brother. Mikki Santee noted he would prefer [facility], [facility], [facility], [facility], [facility], or [facility]. [facility] has declined [facility] and [facility] have declined. [facility] has no bed availability [facility] is no longer open. VM left for [facility]. TOC will continue to follow for DC planning.  Expected Discharge Plan: [place] Barriers to Discharge: SNF Pending bed offer, Ship broker, Continued Medical Work up  Expected Discharge Plan and Services Expected Discharge Plan: [place] Choice: [place] arrangements for the past 2 months: Single Family Home, [place]                                       Social Determinants of Health (SDOH) Interventions    Readmission Risk Interventions No flowsheet data found.

## 2021-11-22 NOTE — Progress Notes (Signed)
Pt called out several times that she could not breath, though O2 SAT on room air was above 94%.  Each time I helped her to  sit up with the bed on high fowler position. After several failed attempt to have her on RA , in the said position. without difficulty breathing I put her on 2L of O2. After couple of minutes, Pt called out that she wanted the O2 increased. I increased  the O2 to 4L while still on high Fowler position.

## 2021-11-22 NOTE — Progress Notes (Signed)
As per Maija Prater, pt maintained SpO2 above 92% while ambulating.

## 2021-11-22 NOTE — Progress Notes (Addendum)
Subjective: I seen and evaluated Cynthia Hill at bedside.  She was sitting up in bed.  She states that she feels much better since admission.  However, she endorses some confusion.  Agreeable to appointment about being discharged to SNF.  Objective:  Vital signs in last 24 hours: Vitals:   11/22/21 0403 11/22/21 0803 11/22/21 0805 11/22/21 1118  BP: (!) 144/58 (!) 143/69  (!) 164/66  Pulse: 70 72  77  Resp:   16 17  Temp: (!) 97.5 F (36.4 C)  97.8 F (36.6 C) (!) 97.5 F (36.4 C)  TempSrc: Oral  Oral Oral  SpO2: 100% 99%  92%  Weight: 93.6 kg     Height:       Physical Exam Constitutional:      General: She is not in acute distress.    Interventions: Nasal cannula in place.  HENT:     Head: Normocephalic and atraumatic.  Cardiovascular:     Rate and Rhythm: Normal rate and regular rhythm.  Pulmonary:     Effort: Pulmonary effort is normal.     Breath sounds: Normal breath sounds. No rales.  Musculoskeletal:     Right lower leg: No edema.     Left lower leg: No edema.  Skin:    General: Skin is warm and dry.  Neurological:     Mental Status: She is alert.  Psychiatric:        Behavior: Behavior is cooperative.     Assessment/Plan:  Principal Problem:   Symptomatic anemia Active Problems:   Acute diastolic CHF (congestive heart failure) (HCC)   HTN (hypertension)   Pericardial effusion   Uterine mass  Cynthia Hill is a 71 y.o. living with a history of hypothyroidism, HFpEF, history of pericardial effusion secondary to heart failure, iron deficiency anemia secondary to postmenopausal vaginal bleeding who presented with shortness of breath and admitted for symptomatic anemia, heart failure exacerbation.  Hypothyroidism Patient states she continues to feel "confused", though alert and oriented.  Given untreated hypothyroidism, it is not unusual for patient to experience brain fog.  Patient was initiated on Synthroid 50 mcg a few days ago and will take a  few weeks for effects to kick in and symptoms to resolve. --Recheck TSH outpatient in 4 weeks --Continue Synthroid 50 mcg daily  Acute on chronic HFpEF exacerbation, resolving Overnight net output 1.9 L; total net output since admission 7.2 L.  Patient continues to saturate well on room air greater than 98%.  Lungs are clear on auscultation.  Patient was able to get out of bed and take a shower with assistance, reports no difficulty breathing.  Patient is diuresing appropriately and appears to be near her dry weight.  PT/OT recommending SNF placement. --Avoid O2 supplementation unless O2 sats drop <88% --Pending SNF --Lasix 40 mg daily  Hypertension Since initiation of losartan 25 mg yesterday, patient's blood pressure has since improved.  SBP ranging now 009Q systolic.  Creatinine levels remain stable at 1.02. --Losartan 25 mg daily --Monitor blood pressure  Iron deficiency anemia Hemoglobin remained stable at 7.5; unchanged from previous day.  No sign of active bleeding.  Patient received second dose of Feraheme today. --Daily CBC --Transfuse if hemoglobin <7 -- Follow-up outpatient OB/GYN and GI for colonoscopy  CKD stage IIIa --Stable  Moderate pericardial effusion --Stable  Prior to Admission Living Arrangement:home Anticipated Discharge Location:SNF Barriers to Discharge: Dispo: Anticipated discharge in approximately 1-2 day(s).   Timothy Lasso, MD 11/22/2021, 11:45 AM Pager: [phone] After  5pm on weekdays and 1pm on weekends: On Call pager [phone]

## 2021-11-22 NOTE — Care Management Important Message (Signed)
Important Message  Patient Details  Name: Cynthia Hill MRN: [id number] Date of Birth: 1950/10/21   Medicare Important Message Given:  Yes     Shelda Altes 11/22/2021, 9:03 AM

## 2021-11-23 LAB — CBC WITH DIFFERENTIAL/PLATELET
Abs Immature Granulocytes: 0 10*3/uL (ref 0.00–0.07)
Basophils Absolute: 0.1 10*3/uL (ref 0.0–0.1)
Basophils Relative: 2 %
Eosinophils Absolute: 0.4 10*3/uL (ref 0.0–0.5)
Eosinophils Relative: 6 %
HCT: 29.3 % — ABNORMAL LOW (ref 36.0–46.0)
Hemoglobin: 8.2 g/dL — ABNORMAL LOW (ref 12.0–15.0)
Lymphocytes Relative: 13 %
Lymphs Abs: 0.9 10*3/uL (ref 0.7–4.0)
MCH: 21.9 pg — ABNORMAL LOW (ref 26.0–34.0)
MCHC: 28 g/dL — ABNORMAL LOW (ref 30.0–36.0)
MCV: 78.1 fL — ABNORMAL LOW (ref 80.0–100.0)
Monocytes Absolute: 0.3 10*3/uL (ref 0.1–1.0)
Monocytes Relative: 4 %
Neutro Abs: 5.3 10*3/uL (ref 1.7–7.7)
Neutrophils Relative %: 75 %
Platelets: 155 10*3/uL (ref 150–400)
RBC: 3.75 MIL/uL — ABNORMAL LOW (ref 3.87–5.11)
RDW: 24.6 % — ABNORMAL HIGH (ref 11.5–15.5)
WBC: 7 10*3/uL (ref 4.0–10.5)
nRBC: 0 % (ref 0.0–0.2)
nRBC: 0 /100 WBC

## 2021-11-23 LAB — BASIC METABOLIC PANEL
Anion gap: 8 (ref 5–15)
BUN: 9 mg/dL (ref 8–23)
CO2: 35 mmol/L — ABNORMAL HIGH (ref 22–32)
Calcium: 9.1 mg/dL (ref 8.9–10.3)
Chloride: 95 mmol/L — ABNORMAL LOW (ref 98–111)
Creatinine, Ser: 1.01 mg/dL — ABNORMAL HIGH (ref 0.44–1.00)
GFR, Estimated: >60 mL/min (ref >60)
Glucose, Bld: 84 mg/dL (ref 70–99)
Potassium: 3.5 mmol/L (ref 3.5–5.1)
Sodium: 138 mmol/L (ref 135–145)

## 2021-11-23 LAB — SARS CORONAVIRUS 2 (TAT 6-24 HRS): SARS Coronavirus 2: NEGATIVE

## 2021-11-23 LAB — GLUCOSE, CAPILLARY: Glucose-Capillary: 81 mg/dL (ref 70–99)

## 2021-11-23 NOTE — Progress Notes (Signed)
Occupational Therapy Treatment Patient Details Name: Cynthia Hill MRN: [id number] DOB: 1951-07-21 Today's Date: 11/23/2021   History of present illness Pt adm 2/17 with symptomatic anemia and acute on chronic heart failure. Transfused 3 units of blood. Pt also with moderate pericardial effusion. PMH - chf, anemia, postmenopausal uterine bleeding, uterine mass, hypothyroidism   OT comments  Pt progressing towards goals, able requiring min guard with RW for toilet transfer, max A for LB dressing. Pt demonstrates fair balance, unable to reach down for LB dressing while sitting EOB without LOB. Pt with decreased short term memory, requiring reminders to use call bell when she needs assistance. Pt presenting with impairments listed below, will follow acutely. Continue to recommend SNF at d/c.   Recommendations for follow up therapy are one component of a multi-disciplinary discharge planning process, led by the attending physician.  Recommendations may be updated based on patient status, additional functional criteria and insurance authorization.    Follow Up Recommendations  Skilled nursing-short term rehab (<3 hours/day)    Assistance Recommended at Discharge Intermittent Supervision/Assistance  Patient can return home with the following  A little help with walking and/or transfers;A lot of help with bathing/dressing/bathroom;Assistance with cooking/housework;Help with stairs or ramp for entrance   Equipment Recommendations  None recommended by OT;Other (comment) (defer to next venue of care)    Recommendations for Other Services PT consult    Precautions / Restrictions Precautions Precautions: Fall;Other (comment) Precaution Comments: incontinent Restrictions Weight Bearing Restrictions: No       Mobility Bed Mobility               General bed mobility comments: sitting EOB upon arrival    Transfers Overall transfer level: Needs assistance Equipment used: Rolling  walker (2 wheels) Transfers: Sit to/from Stand, Bed to chair/wheelchair/BSC Sit to Stand: Min guard Stand pivot transfers: Min guard   Step pivot transfers: Min guard           Balance Overall balance assessment: Needs assistance Sitting-balance support: Feet supported Sitting balance-Leahy Scale: Fair Sitting balance - Comments: unable to reach outside BOS sitting EOB   Standing balance support: During functional activity, Reliant on assistive device for balance, Single extremity supported Standing balance-Leahy Scale: Fair                             ADL either performed or assessed with clinical judgement   ADL Overall ADL's : Needs assistance/impaired Eating/Feeding: Set up;Sitting Eating/Feeding Details (indicate cue type and reason): up in bed eating upon arrival                 Lower Body Dressing: Maximal assistance;Sitting/lateral leans Lower Body Dressing Details (indicate cue type and reason): to don socks at EOB Toilet Transfer: Min guard;Rolling walker (2 wheels);BSC/3in1;Stand-pivot Toilet Transfer Details (indicate cue type and reason): simulated to chair         Functional mobility during ADLs: Min guard;Rolling walker (2 wheels);Cueing for safety;Cueing for sequencing      Extremity/Trunk Assessment Upper Extremity Assessment Upper Extremity Assessment: Generalized weakness   Lower Extremity Assessment Lower Extremity Assessment: Defer to PT evaluation        Vision   Vision Assessment?: No apparent visual deficits   Perception Perception Perception: Not tested   Praxis Praxis Praxis: Not tested    Cognition Arousal/Alertness: Awake/alert Behavior During Therapy: WFL for tasks assessed/performed Overall Cognitive Status: Impaired/Different from baseline Area of Impairment: Following commands, Safety/judgement  Following Commands: Follows one step commands with increased time, Follows one  step commands consistently Safety/Judgement: Decreased awareness of safety, Decreased awareness of deficits     General Comments: unable to identify bed is wet, needing increased cues to use call bell        Exercises      Shoulder Instructions       General Comments VSS on RA    Pertinent Vitals/ Pain       Pain Assessment Pain Assessment: No/denies pain  Home Living                                          Prior Functioning/Environment              Frequency  Min 2X/week        Progress Toward Goals  OT Goals(current goals can now be found in the care plan section)  Progress towards OT goals: Progressing toward goals  Acute Rehab OT Goals Patient Stated Goal: none stated OT Goal Formulation: With patient Time For Goal Achievement: 12/05/21 Potential to Achieve Goals: Good ADL Goals Pt Will Perform Upper Body Dressing: with supervision;sitting Pt Will Perform Lower Body Dressing: with mod assist;sit to/from stand;sitting/lateral leans Pt Will Transfer to Toilet: with supervision;ambulating;bedside commode;stand pivot transfer  Plan Discharge plan remains appropriate;Frequency remains appropriate    Co-evaluation                 AM-PAC OT "6 Clicks" Daily Activity     Outcome Measure   Help from another person eating meals?: None Help from another person taking care of personal grooming?: A Little Help from another person toileting, which includes using toliet, bedpan, or urinal?: A Lot Help from another person bathing (including washing, rinsing, drying)?: A Lot Help from another person to put on and taking off regular upper body clothing?: A Little Help from another person to put on and taking off regular lower body clothing?: Total 6 Click Score: 15    End of Session Equipment Utilized During Treatment: Gait belt;Rolling walker (2 wheels)  OT Visit Diagnosis: Unsteadiness on feet (R26.81);Other abnormalities of gait and  mobility (R26.89);Muscle weakness (generalized) (M62.81)   Activity Tolerance Patient tolerated treatment well   Patient Left in chair;with call bell/phone within reach;with chair alarm set   Nurse Communication Mobility status        Time: 1431-1444 OT Time Calculation (min): 13 min  Charges: OT General Charges $OT Visit: 1 Visit OT Treatments $Self Care/Home Management : 8-22 mins  Lynnda Child, OTD, OTR/L Acute Rehab [phone] - [place] 11/23/2021, 2:51 PM

## 2021-11-23 NOTE — TOC Progression Note (Addendum)
Transition of Care [facility]) - Progression Note    Patient Details  Name: Cynthia Hill MRN: [id number] Date of Birth: 1951/03/30  Transition of Care [facility]) CM/SW [place], [place] Phone Number: 11/23/2021, 10:06 AM  Clinical Narrative:     Update- 4:55pm- CSW received insurance authorization approval for patient. Reference number# [id number]. Insurance approved from 2/22-2/24 Next review date 2/24. CSW informed Bryson Ha with [facility] who confirmed she can accept patient tomorrow if medically ready. CSW updated patient and patients brother Cynthia Hill.CSW informed MD.  CSW received call from patients insurance. Patients insurance has gone to [employer] for review. Insurance auth currently pending.Patient has SN[facility] bed at [facility]. CSW will continue to follow and assist with patients dc planning needs.  Expected Discharge Plan: [place] Barriers to Discharge: SNF Pending bed offer, Ship broker, Continued Medical Work up  Expected Discharge Plan and Services Expected Discharge Plan: [place] Choice: [place] arrangements for the past 2 months: Single Family Home, [place]                                       Social Determinants of Health (SDOH) Interventions    Readmission Risk Interventions No flowsheet data found.

## 2021-11-23 NOTE — Progress Notes (Addendum)
Subjective: I seen and evaluated Ms. Baik at bedside.  She was sitting in the chair next to the bed.  She reports improvement in her breathing.  She did not have the nasal cannula on.  She endorsed some MSK chest pain, she attributes it to increased movement over the last day or so.  Otherwise, denies any other complaints.  Objective:  Vital signs in last 24 hours: Vitals:   11/22/21 1810 11/22/21 2005 11/23/21 0506 11/23/21 1005  BP: (!) 181/76 (!) 181/90 (!) 168/69 (!) 165/61  Pulse: 81 80 76 76  Resp:  18  18  Temp:  (!) 97.5 F (36.4 C) 97.9 F (36.6 C) (!) 97.4 F (36.3 C)  TempSrc:  Oral Oral Oral  SpO2: 92% 93% 98% 97%  Weight:   91.7 kg   Height:       Physical Exam Constitutional:      General: She is awake. She is not in acute distress. Cardiovascular:     Rate and Rhythm: Normal rate and regular rhythm.     Heart sounds: Murmur heard.  Pulmonary:     Effort: Pulmonary effort is normal.     Breath sounds: Normal air entry.  Chest:     Chest wall: Tenderness present.  Musculoskeletal:     Right lower leg: No edema.     Left lower leg: No edema.  Skin:    Comments: Skin discoloration of the bilateral lower extremities  Neurological:     General: No focal deficit present.     Mental Status: She is alert.  Psychiatric:        Behavior: Behavior is cooperative.     Assessment/Plan:  Principal Problem:   Symptomatic anemia Active Problems:   Acute diastolic CHF (congestive heart failure) (HCC)   HTN (hypertension)   Pericardial effusion   Uterine mass   Hypothyroidism  Cynthia Hill is a 71 y.o. living with a history of hypothyroidism, HFpEF, history of pericardial effusion secondary to heart failure, iron deficiency anemia secondary to postmenopausal vaginal bleeding who presented with shortness of breath and admitted for symptomatic anemia, heart failure exacerbation.   Hypertension Yesterday patient had a spike in her blood pressure SBP  180s and asymptomatic; this was only 1 reading noted.  Conservative management advised and patient blood pressure improved.  This morning after medication administration blood pressure improved.  Will keep current management.  If blood pressure continues to fluctuate, will consider increasing losartan dose. --Losartan 25 mg daily  Iron deficiency anemia Overnight, nurse noted some vaginal bleeding.  This is a known history and not new onset.  This issue has already been addressed and patient is to follow-up OB/GYN outpatient.  Blood counts unaffected.  Hemoglobin continues to improve; hemoglobin 8.2 <---7.5 (yesterday).  Patient received Feraheme dose yesterday.  Upon discharge, patient will be sent home on oral iron supplements. --Trend CBC  Acute on chronic HFpEF exacerbation, resolved Patient continues to diurese well.  Total net output since admission 8 L.  Lungs are clear on auscultation.  Patient is saturating well on room air.  PT OT recommends SNF; SNF placement pending. --Lasix 40 mg daily --Pending SNF  Hypothyroidism Patient endorsed some fatigue, stating likely due to sitting around in the hospital.  Patient looks forward to going to SNF.  -- Continue Synthroid 50 mcg daily --Recheck TSH outpatient in 4 weeks  CKD stage IIIa --Stable   Moderate pericardial effusion likely 2/2 untreated hypothyroidism --Stable   Prior to Admission Living Arrangement:home  Anticipated Discharge Location:SNF Barriers to Discharge: SNF bed offer, insurance authorization Dispo: Anticipated discharge in approximately 1-2 day(s).   Timothy Lasso, MD 11/23/2021, 11:50 AM Pager: [phone] After 5pm on weekdays and 1pm on weekends: On Call pager [phone]

## 2021-11-23 NOTE — Progress Notes (Signed)
While doing AD I noticed  Pt has virginal bleed. Paged internal medicine Resident on call. He called back to acknowledge receipt of the page

## 2021-11-24 DIAGNOSIS — I3139 Other pericardial effusion (noninflammatory): Secondary | ICD-10-CM | POA: Diagnosis not present

## 2021-11-24 DIAGNOSIS — R262 Difficulty in walking, not elsewhere classified: Secondary | ICD-10-CM | POA: Diagnosis not present

## 2021-11-24 DIAGNOSIS — I10 Essential (primary) hypertension: Secondary | ICD-10-CM | POA: Diagnosis not present

## 2021-11-24 DIAGNOSIS — M109 Gout, unspecified: Secondary | ICD-10-CM | POA: Diagnosis not present

## 2021-11-24 DIAGNOSIS — M6281 Muscle weakness (generalized): Secondary | ICD-10-CM | POA: Diagnosis not present

## 2021-11-24 DIAGNOSIS — D696 Thrombocytopenia, unspecified: Secondary | ICD-10-CM | POA: Diagnosis not present

## 2021-11-24 DIAGNOSIS — Z743 Need for continuous supervision: Secondary | ICD-10-CM | POA: Diagnosis not present

## 2021-11-24 DIAGNOSIS — R5381 Other malaise: Secondary | ICD-10-CM | POA: Diagnosis not present

## 2021-11-24 DIAGNOSIS — R1312 Dysphagia, oropharyngeal phase: Secondary | ICD-10-CM | POA: Diagnosis not present

## 2021-11-24 DIAGNOSIS — R6889 Other general symptoms and signs: Secondary | ICD-10-CM | POA: Diagnosis not present

## 2021-11-24 DIAGNOSIS — E039 Hypothyroidism, unspecified: Secondary | ICD-10-CM | POA: Diagnosis not present

## 2021-11-24 DIAGNOSIS — Z7401 Bed confinement status: Secondary | ICD-10-CM | POA: Diagnosis not present

## 2021-11-24 DIAGNOSIS — D649 Anemia, unspecified: Secondary | ICD-10-CM | POA: Diagnosis not present

## 2021-11-24 DIAGNOSIS — R410 Disorientation, unspecified: Secondary | ICD-10-CM | POA: Diagnosis not present

## 2021-11-24 DIAGNOSIS — D509 Iron deficiency anemia, unspecified: Secondary | ICD-10-CM | POA: Diagnosis not present

## 2021-11-24 DIAGNOSIS — E876 Hypokalemia: Secondary | ICD-10-CM | POA: Diagnosis not present

## 2021-11-24 DIAGNOSIS — I5031 Acute diastolic (congestive) heart failure: Secondary | ICD-10-CM | POA: Diagnosis not present

## 2021-11-24 DIAGNOSIS — E46 Unspecified protein-calorie malnutrition: Secondary | ICD-10-CM | POA: Diagnosis not present

## 2021-11-24 LAB — BASIC METABOLIC PANEL
Anion gap: 7 (ref 5–15)
BUN: 11 mg/dL (ref 8–23)
CO2: 34 mmol/L — ABNORMAL HIGH (ref 22–32)
Calcium: 9.2 mg/dL (ref 8.9–10.3)
Chloride: 98 mmol/L (ref 98–111)
Creatinine, Ser: 1.06 mg/dL — ABNORMAL HIGH (ref 0.44–1.00)
GFR, Estimated: 57 mL/min — ABNORMAL LOW (ref >60)
Glucose, Bld: 91 mg/dL (ref 70–99)
Potassium: 3.7 mmol/L (ref 3.5–5.1)
Sodium: 139 mmol/L (ref 135–145)

## 2021-11-24 LAB — CBC WITH DIFFERENTIAL/PLATELET
Abs Immature Granulocytes: 0.11 10*3/uL — ABNORMAL HIGH (ref 0.00–0.07)
Basophils Absolute: 0.1 10*3/uL (ref 0.0–0.1)
Basophils Relative: 1 %
Eosinophils Absolute: 0.3 10*3/uL (ref 0.0–0.5)
Eosinophils Relative: 4 %
HCT: 28.5 % — ABNORMAL LOW (ref 36.0–46.0)
Hemoglobin: 8.2 g/dL — ABNORMAL LOW (ref 12.0–15.0)
Immature Granulocytes: 2 %
Lymphocytes Relative: 15 %
Lymphs Abs: 1.1 10*3/uL (ref 0.7–4.0)
MCH: 22.6 pg — ABNORMAL LOW (ref 26.0–34.0)
MCHC: 28.8 g/dL — ABNORMAL LOW (ref 30.0–36.0)
MCV: 78.5 fL — ABNORMAL LOW (ref 80.0–100.0)
Monocytes Absolute: 0.3 10*3/uL (ref 0.1–1.0)
Monocytes Relative: 5 %
Neutro Abs: 5.1 10*3/uL (ref 1.7–7.7)
Neutrophils Relative %: 73 %
Platelets: 140 10*3/uL — ABNORMAL LOW (ref 150–400)
RBC: 3.63 MIL/uL — ABNORMAL LOW (ref 3.87–5.11)
RDW: 25.5 % — ABNORMAL HIGH (ref 11.5–15.5)
Smear Review: ADEQUATE
WBC: 7 10*3/uL (ref 4.0–10.5)
nRBC: 0.3 % — ABNORMAL HIGH (ref 0.0–0.2)

## 2021-11-24 LAB — GLUCOSE, CAPILLARY: Glucose-Capillary: 143 mg/dL — ABNORMAL HIGH (ref 70–99)

## 2021-11-24 MED ORDER — LOSARTAN POTASSIUM 25 MG PO TABS: 50.0000 mg | ORAL_TABLET | Freq: Every day | ORAL | 0 refills | Status: DC

## 2021-11-24 MED ORDER — FUROSEMIDE 40 MG PO TABS: 40.0000 mg | ORAL_TABLET | Freq: Every day | ORAL | 0 refills | Status: DC

## 2021-11-24 MED ORDER — FERROUS SULFATE 325 (65 FE) MG PO TBEC: 325.0000 mg | DELAYED_RELEASE_TABLET | Freq: Every day | ORAL | 0 refills | Status: DC

## 2021-11-24 MED ORDER — POTASSIUM CHLORIDE CRYS ER 20 MEQ PO TBCR: 20.0000 meq | EXTENDED_RELEASE_TABLET | Freq: Every day | ORAL | 0 refills | Status: DC

## 2021-11-24 MED ORDER — LEVOTHYROXINE SODIUM 50 MCG PO TABS: 50.0000 ug | ORAL_TABLET | Freq: Every day | ORAL | 0 refills | Status: DC

## 2021-11-24 NOTE — Discharge Instructions (Signed)
Please continue taking pressure medication losartan 25 mg daily.  It is important to continue taking this medication to decrease damage to the heart and kidneys.  Please continue taking Levothyroxine 50 mcg daily.  You will continue to feel better and you gain your strength and become less confused.  Please follow-up with gynecology to address your vaginal bleeding.  Please follow-up with your primary doctor regarding your blood levels. Blood counts are much better since receiving blood transfusion; however they are still on the lower end.  Please have your primary doctor repeat your blood levels.  Please continue taking your iron supplements, because your iron levels are still low. This will help you be less fatigued and help your blood counts improve.  Please continue taking Lasix (water pill) so that you do not build up fluid in your body or lungs.  This will help you continue to breathe better and keep the fluid off.

## 2021-11-24 NOTE — Plan of Care (Signed)

## 2021-11-24 NOTE — TOC Transition Note (Signed)
Transition of Care [facility]) - CM/SW Discharge Note   Patient Details  Name: Cynthia Hill MRN: [id number] Date of Birth: 06/30/1951  Transition of Care [facility]) CM/SW Contact:  Milas Gain, [place] Phone Number: 11/24/2021, 12:06 PM   Clinical Narrative:     Patient will DC to: [facility]  Anticipated DC date: 11/24/2021  Family notified: [name]   Transport by: Corey Harold  ?  Per MD patient ready for DC to [facility] . RN, patient, patient's family, and facility notified of DC. Discharge Summary sent to facility. RN given number for report tele#[phone] RM# [phone]. DC packet on chart. Ambulance transport requested for patient.  CSW signing off.   Final next level of care: Skilled Nursing Facility Barriers to Discharge: No Barriers Identified   Patient Goals and CMS Choice Patient states their goals for this hospitalization and ongoing recovery are:: SNF CMS Medicare.gov Compare Post Acute Care list provided to:: Patient Represenative (must comment) (patient and patients brother) Choice offered to / list presented to : Sibling, Patient  Discharge Placement              Patient chooses bed at: [name] Patient to be transferred to facility by: [place] Name of family member notified: [name] Patient and family notified of of transfer: 11/24/21  Discharge Plan and Services     Post Acute Care Choice: [place]                               Social Determinants of Health (SDOH) Interventions     Readmission Risk Interventions No flowsheet data found.

## 2021-11-24 NOTE — Discharge Summary (Signed)
Name: Cynthia Hill MRN: [id number] DOB: 1951/01/12 71 y.o. PCP: Pcp, No  Date of Admission: 11/18/2021  3:14 PM Date of Discharge: 11/24/21 Attending Physician: Lucious Groves, DO  Discharge Diagnosis: 1. Principal Problem:   Symptomatic anemia Active Problems:   Acute diastolic CHF exacerbation   HTN    Pericardial effusion   Post menopausal Vaginal bleeding 2/2 uterine mass   Hypothyroidism    Discharge Medications: Allergies as of 11/24/2021       Reactions   Aspirin Other (See Comments)   unknown   Sulfa Antibiotics Other (See Comments)   unknown   Albuterol Other (See Comments)   Worsens asthma   Atenolol Other (See Comments)   unknown   Bactroban [mupirocin] Swelling   Benzocaine Other (See Comments)   Throat swelling   Bumex [bumetanide] Other (See Comments)   Throat swelling   Levaquin [levofloxacin] Other (See Comments)   unknown   Morphine And Related Other (See Comments)   unknown   Other Other (See Comments)   Sea food, shell fish   Propranolol Other (See Comments)   unknown   Ptu [propylthiouracil] Other (See Comments)   Doubles heart rate   Spironolactone Other (See Comments)   Throat swelling   Tapazole [methimazole] Other (See Comments)   Irregular heartbeat   Tetracyclines & Related Other (See Comments)   Throat swelling   Toprol Xl [metoprolol] Other (See Comments)   unknown   Triamterene Other (See Comments)   unknown   Valium [diazepam] Other (See Comments)   unknown        Medication List     STOP taking these medications    potassium chloride 10 MEQ tablet Commonly known as: KLOR-CON       TAKE these medications    colchicine 0.6 MG tablet Take 1 tablet (0.6 mg total) by mouth daily.   ferrous sulfate 325 (65 FE) MG EC tablet Take 1 tablet (325 mg total) by mouth daily with breakfast. What changed: when to take this   furosemide 40 MG tablet Commonly known as: LASIX Take 1 tablet (40 mg total) by mouth  daily. Start taking on: November 25, 2021 What changed:  medication strength how much to take when to take this   levothyroxine 50 MCG tablet Commonly known as: SYNTHROID Take 1 tablet (50 mcg total) by mouth daily at 6 (six) AM. Start taking on: November 25, 2021 What changed:  medication strength how much to take   losartan 25 MG tablet Commonly known as: COZAAR Take 2 tablets (50 mg total) by mouth daily. Start taking on: November 25, 2021   potassium chloride SA 20 MEQ tablet Commonly known as: KLOR-CON M Take 1 tablet (20 mEq total) by mouth daily.        Disposition and follow-up:   Ms.Cynthia Hill was discharged from [facility] in Good condition.  At the hospital follow up visit please address:  1.  Follow up with PCP to address current medication compliance; recheck CBC, assess Hgb levels; Recheck TSH in 4 weeks since initiating synthroid. Repeat BP check to assess BP control on losartan, consider second agent if still uncontrolled. Repeat Echocardiogram to assess pericardial effusion.  2.  Labs / imaging needed at time of follow-up: CBC, TSH, Echocardiogram  3.  Pending labs/ test needing follow-up: None  Follow-up Appointments:   Hospital Course by problem list: 1.  Symptomatic iron deficiency anemia: Patient presented to the ED complaining of shortness of breath.  Initial labs significant for hemoglobin of 4.6.  Patient was transfused 2 units of PRBC with appropriate response.  Hemoglobin improved to ~7.5.  Patient was given 2 doses of Feraheme throughout hospitalization.  Hemoglobin continued to improve; at time of discharge hemoglobin was 8.2.  Patient reports feeling much better and is asymptomatic. 2.  Acute diastolic CHF exacerbation/pericardial effusion: In the ED, initial labs significant for BNP elevated at 640 and chest x-ray showing mild CHF.  Echo was obtained during hospitalization and patient was found to have pericardial  effusion with no evidence of tamponade, EF 60 to 65%, LV normal function, no regional wall motion abnormalities, G2DD.  Patient was diuresed with IV Lasix 40 mg twice daily, patient yielded appropriate output, >8 L at time of discharge.  Patient was started on nasal cannula 2 L, saturating well, patient did well on room air.  No oxygen requirement at time of discharge.  Patient had a component of anxiety, in which she was expressed that she felt like she "could not breathe", however saturated well on and off nasal cannula; >97%.  Pericardial effusion remains stable, patient exhibited no signs of tamponade, no Becks triad noted.  BP remained stable, no JVD on exam, stable pulses.  3.  Hypertension: During hospitalization, patient became hypertensive.  Patient has a history of uncontrolled chronic conditions due to not believing in [place] medicine.  Patient was initially resistant to initiating anti-hypertensive agents.  After lengthy discussion about risk of untreated hypertension, patient was agreeable to begin losartan 25 mg daily.  Blood pressure improved since initiation of antihypertensive agent.  Creatinine levels stable, ~1.0. 4.  Postmenopausal vaginal bleeding secondary to uterine mass: Patient endorses known history of vaginal bleeding since menopause age 66.  Patient states this has been ongoing for years and she has not followed up with GYN.  During hospitalization, patient had 1 episode of vaginal bleeding, that was noted when wiping after using the bathroom.  No severe blood loss.  Hemoglobin remained stable.  Patient was encouraged and recommended to follow-up outpatient gynecology.  Patient agreed. 5.  Hypothyroidism: Patient had history of hyperthyroidism on PTU; then experienced thyroid burnout and became hypothyroid.  Patient previously used over-the-counter T3, on admission TSH elevated at 12 and T4 low at 0.3. Low suspicion for myxedema coma; Patient was symptomatic, complaining of brain fog,  confusion and fatigue. Patient was initiated on low-dose Synthroid and slowly began to improve.  Discharge Exam:   BP (!) 162/68 (BP Location: Right Arm)    Pulse 80    Temp (!) 97.5 F (36.4 C) (Oral)    Resp 18    Ht 5' (1.524 m)    Wt 91.7 kg    SpO2 93%    BMI 39.48 kg/m  Discharge exam: Physical Exam Constitutional:      General: She is awake. She is not in acute distress. HENT:     Head: Normocephalic and atraumatic.  Cardiovascular:     Rate and Rhythm: Normal rate.     Heart sounds: Murmur heard.  Pulmonary:     Effort: Pulmonary effort is normal.     Breath sounds: Normal breath sounds and air entry. No wheezing or rales.  Musculoskeletal:     Right lower leg: No edema.     Left lower leg: No edema.  Skin:    General: Skin is warm and dry.  Neurological:     General: No focal deficit present.     Mental Status: She is alert. Mental  status is at baseline.  Psychiatric:        Behavior: Behavior normal. Behavior is cooperative.     Pertinent Labs, Studies, and Procedures:  CBC Latest Ref Rng & Units 11/24/2021 11/23/2021 11/22/2021  WBC 4.0 - 10.5 K/uL 7.0 7.0 7.4  Hemoglobin 12.0 - 15.0 g/dL 8.2(L) 8.2(L) 7.5(L)  Hematocrit 36.0 - 46.0 % 28.5(L) 29.3(L) 26.6(L)  Platelets 150 - 400 K/uL 140(L) 155 173   BMP Latest Ref Rng & Units 11/24/2021 11/23/2021 11/22/2021  Glucose 70 - 99 mg/dL 91 84 80  BUN 8 - 23 mg/dL 11 9 9   Creatinine 0.44 - 1.00 mg/dL 1.06(H) 1.01(H) 1.02(H)  Sodium 135 - 145 mmol/L 139 138 138  Potassium 3.5 - 5.1 mmol/L 3.7 3.5 3.1(L)  Chloride 98 - 111 mmol/L 98 95(L) 97(L)  CO2 22 - 32 mmol/L 34(H) 35(H) 34(H)  Calcium 8.9 - 10.3 mg/dL 9.2 9.1 8.7(L)   DG Chest Portable 1 View  Result Date: 11/18/2021 CLINICAL DATA:  Shortness of breath, history of CHF, hypertension EXAM: PORTABLE CHEST 1 VIEW COMPARISON:  Portable exam 1536 hours compared to 12/12/2020 FINDINGS: Enlargement of cardiac silhouette with pulmonary vascular congestion. Interstitial  infiltrates in the mid to lower lungs likely minimal pulmonary edema. Loss of the lateral LEFT diaphragmatic silhouette suspect minimal LEFT pleural effusion. No pneumothorax. Bones demineralized. IMPRESSION: Probable mild CHF. Electronically Signed   By: Lavonia Dana M.D.   On: 11/18/2021 15:46   ECHOCARDIOGRAM COMPLETE  Result Date: 11/19/2021    ECHOCARDIOGRAM REPORT   Patient Name:   ADASYN MCADAMS Date of Exam: 11/19/2021 Medical Rec #:  [id number]         Height:       60.0 in Accession #:    [id number]        Weight:       217.8 lb Date of Birth:  [birth year]         BSA:          1.936 m Patient Age:    9 years          BP:           176/75 mmHg Patient Gender: F                 HR:           71 bpm. Exam Location:  Inpatient Procedure: 2D Echo, Cardiac Doppler and Color Doppler STAT ECHO Indications:    Dyspnea  History:        Patient has prior history of Echocardiogram examinations, most                 recent 12/13/2020. CHF, Signs/Symptoms:Dyspnea and Shortness of                 Breath; Risk Factors:Hypertension.  Sonographer:    Bernadene Person RDCS Referring Phys: [id number] ADAM CURATOLO  Sonographer Comments: Image acquisition challenging due to respiratory motion. Pt sitting up during exam due to dyspnea. IMPRESSIONS  1. Left ventricular ejection fraction, by estimation, is 60 to 65%. The left ventricle has normal function. The left ventricle has no regional wall motion abnormalities. There is mild left ventricular hypertrophy. Left ventricular diastolic parameters are consistent with Grade II diastolic dysfunction (pseudonormalization). Elevated left atrial pressure.  2. Right ventricular systolic function is normal. The right ventricular size is normal. There is moderately elevated pulmonary artery systolic pressure.  3. Left atrial size was mildly dilated.  4. Moderate pericardial effusion. The pericardial effusion  is circumferential. There is no evidence of cardiac tamponade.  5. Very eccentric  anterior directed MR difficult to quantify. The posterior leaflet is poorly visualized but the course of the jet would suggest some degree of posterior leaflet prolapse. Probable at least moderate MR, could consider TEE to better quantify MR. . The mitral valve is abnormal. At least moderate MR mitral valve regurgitation. No evidence of mitral stenosis.  6. The aortic valve is tricuspid. There is moderate calcification of the aortic valve. There is moderate thickening of the aortic valve. Aortic valve regurgitation is not visualized. Mild aortic valve stenosis.  7. Aortic dilatation noted. There is mild dilatation of the ascending aorta, measuring 41 mm.  8. The inferior vena cava is dilated in size with <50% respiratory variability, suggesting right atrial pressure of 15 mmHg. FINDINGS  Left Ventricle: Left ventricular ejection fraction, by estimation, is 60 to 65%. The left ventricle has normal function. The left ventricle has no regional wall motion abnormalities. The left ventricular internal cavity size was normal in size. There is  mild left ventricular hypertrophy. Left ventricular diastolic parameters are consistent with Grade II diastolic dysfunction (pseudonormalization). Elevated left atrial pressure. Right Ventricle: The right ventricular size is normal. Right vetricular wall thickness was not well visualized. Right ventricular systolic function is normal. There is moderately elevated pulmonary artery systolic pressure. The tricuspid regurgitant velocity is 3.33 m/s, and with an assumed right atrial pressure of 15 mmHg, the estimated right ventricular systolic pressure is 02.7 mmHg. Left Atrium: Left atrial size was mildly dilated. Right Atrium: Right atrial size was normal in size. Pericardium: There is no evidence of tamponade physiology. RA indendation but no collapse. No RV collapse. Respiratory variation is 11% not signfiicant. A moderately sized pericardial effusion is present. The pericardial  effusion is circumferential. There is no evidence of cardiac tamponade. Mitral Valve: Very eccentric anterior directed MR difficult to quantify. The posterior leaflet is poorly visualized but the course of the jet would suggest some degree of posterior leaflet prolapse. Probable at least moderate MR, could consider TEE to better quantify MR. The mitral valve is abnormal. There is mild calcification of the mitral valve leaflet(s). Mild mitral annular calcification. At least moderate MR mitral valve regurgitation. No evidence of mitral valve stenosis. Tricuspid Valve: The tricuspid valve is not well visualized. Tricuspid valve regurgitation is mild . No evidence of tricuspid stenosis. Aortic Valve: The aortic valve is tricuspid. There is moderate calcification of the aortic valve. There is moderate thickening of the aortic valve. There is moderate aortic valve annular calcification. Aortic valve regurgitation is not visualized. Mild aortic stenosis is present. Aortic valve mean gradient measures 11.3 mmHg. Aortic valve peak gradient measures 23.1 mmHg. Aortic valve area, by VTI measures 1.84 cm. Pulmonic Valve: The pulmonic valve was not well visualized. Pulmonic valve regurgitation is not visualized. No evidence of pulmonic stenosis. Aorta: The aortic root is normal in size and structure and aortic dilatation noted. There is mild dilatation of the ascending aorta, measuring 41 mm. Venous: The inferior vena cava is dilated in size with less than 50% respiratory variability, suggesting right atrial pressure of 15 mmHg. IAS/Shunts: The interatrial septum was not well visualized.  LEFT VENTRICLE PLAX 2D LVIDd:         5.50 cm      Diastology LVIDs:         3.10 cm      LV e' medial:    4.68 cm/s LV PW:  1.10 cm      LV E/e' medial:  33.8 LV IVS:        1.10 cm      LV e' lateral:   6.00 cm/s LVOT diam:     2.10 cm      LV E/e' lateral: 26.3 LV SV:         100 LV SV Index:   52 LVOT Area:     3.46 cm  LV Volumes  (MOD) LV vol d, MOD A2C: 124.0 ml LV vol d, MOD A4C: 101.0 ml LV vol s, MOD A2C: 37.5 ml LV vol s, MOD A4C: 36.3 ml LV SV MOD A2C:     86.5 ml LV SV MOD A4C:     101.0 ml LV SV MOD BP:      81.8 ml RIGHT VENTRICLE RV S prime:     13.60 cm/s TAPSE (M-mode): 2.0 cm LEFT ATRIUM             Index        RIGHT ATRIUM           Index LA diam:        4.20 cm 2.17 cm/m   RA Area:     13.50 cm LA Vol (A2C):   67.8 ml 35.03 ml/m  RA Volume:   27.40 ml  14.16 ml/m LA Vol (A4C):   56.1 ml 28.98 ml/m LA Biplane Vol: 64.7 ml 33.43 ml/m  AORTIC VALVE AV Area (Vmax):    2.00 cm AV Area (Vmean):   2.06 cm AV Area (VTI):     1.84 cm AV Vmax:           240.45 cm/s AV Vmean:          152.597 cm/s AV VTI:            0.543 m AV Peak Grad:      23.1 mmHg AV Mean Grad:      11.3 mmHg LVOT Vmax:         139.00 cm/s LVOT Vmean:        90.700 cm/s LVOT VTI:          0.289 m LVOT/AV VTI ratio: 0.53  AORTA Ao Root diam: 3.30 cm Ao Asc diam:  4.10 cm MITRAL VALVE                TRICUSPID VALVE MV Area (PHT): 3.72 cm     TR Peak grad:   44.4 mmHg MV Area VTI:   2.57 cm     TR Vmax:        333.00 cm/s MV VTI:        0.39 m MV Decel Time: 204 msec     SHUNTS MR PISA:        1.01 cm    Systemic VTI:  0.29 m MR PISA Radius: 0.40 cm     Systemic Diam: 2.10 cm MV E velocity: 158.00 cm/s MV A velocity: 96.70 cm/s MV E/A ratio:  1.63 Carlyle Dolly MD Electronically signed by Carlyle Dolly MD Signature Date/Time: 11/19/2021/10:41:00 AM    Final      Discharge Instructions: Discharge Instructions     Call MD for:  difficulty breathing, headache or visual disturbances   Complete by: As directed    Call MD for:  extreme fatigue   Complete by: As directed    Call MD for:  hives   Complete by: As directed    Call MD for:  persistant dizziness  or light-headedness   Complete by: As directed    Call MD for:  persistant nausea and vomiting   Complete by: As directed    Call MD for:  redness, tenderness, or signs of infection (pain,  swelling, redness, odor or green/yellow discharge around incision site)   Complete by: As directed    Call MD for:  severe uncontrolled pain   Complete by: As directed    Call MD for:  temperature >100.4   Complete by: As directed    Diet - low sodium heart healthy   Complete by: As directed    Increase activity slowly   Complete by: As directed        Signed: Timothy Lasso, MD 11/24/2021, 11:47 AM   Pager: [phone]

## 2021-11-28 DIAGNOSIS — E039 Hypothyroidism, unspecified: Secondary | ICD-10-CM | POA: Diagnosis not present

## 2021-12-05 DIAGNOSIS — M109 Gout, unspecified: Secondary | ICD-10-CM | POA: Diagnosis not present

## 2021-12-05 DIAGNOSIS — E876 Hypokalemia: Secondary | ICD-10-CM | POA: Diagnosis not present

## 2021-12-05 DIAGNOSIS — E039 Hypothyroidism, unspecified: Secondary | ICD-10-CM | POA: Diagnosis not present

## 2021-12-05 DIAGNOSIS — D696 Thrombocytopenia, unspecified: Secondary | ICD-10-CM | POA: Diagnosis not present

## 2021-12-08 DIAGNOSIS — I10 Essential (primary) hypertension: Secondary | ICD-10-CM | POA: Diagnosis not present

## 2021-12-08 DIAGNOSIS — D509 Iron deficiency anemia, unspecified: Secondary | ICD-10-CM | POA: Diagnosis not present

## 2021-12-08 DIAGNOSIS — I5031 Acute diastolic (congestive) heart failure: Secondary | ICD-10-CM | POA: Diagnosis not present

## 2021-12-08 DIAGNOSIS — R5381 Other malaise: Secondary | ICD-10-CM | POA: Diagnosis not present

## 2021-12-09 DIAGNOSIS — M6281 Muscle weakness (generalized): Secondary | ICD-10-CM | POA: Diagnosis not present

## 2021-12-14 DIAGNOSIS — I10 Essential (primary) hypertension: Secondary | ICD-10-CM | POA: Diagnosis not present

## 2021-12-14 DIAGNOSIS — M6281 Muscle weakness (generalized): Secondary | ICD-10-CM | POA: Diagnosis not present

## 2021-12-14 DIAGNOSIS — E039 Hypothyroidism, unspecified: Secondary | ICD-10-CM | POA: Diagnosis not present

## 2021-12-14 DIAGNOSIS — I5031 Acute diastolic (congestive) heart failure: Secondary | ICD-10-CM | POA: Diagnosis not present

## 2021-12-14 DIAGNOSIS — Z741 Need for assistance with personal care: Secondary | ICD-10-CM | POA: Diagnosis not present

## 2021-12-14 DIAGNOSIS — D509 Iron deficiency anemia, unspecified: Secondary | ICD-10-CM | POA: Diagnosis not present

## 2021-12-19 DIAGNOSIS — I5033 Acute on chronic diastolic (congestive) heart failure: Secondary | ICD-10-CM | POA: Diagnosis not present

## 2021-12-19 DIAGNOSIS — E039 Hypothyroidism, unspecified: Secondary | ICD-10-CM | POA: Diagnosis not present

## 2021-12-19 DIAGNOSIS — I10 Essential (primary) hypertension: Secondary | ICD-10-CM | POA: Diagnosis not present

## 2021-12-19 DIAGNOSIS — D500 Iron deficiency anemia secondary to blood loss (chronic): Secondary | ICD-10-CM | POA: Diagnosis not present

## 2021-12-19 DIAGNOSIS — R413 Other amnesia: Secondary | ICD-10-CM | POA: Diagnosis not present

## 2021-12-19 DIAGNOSIS — I3139 Other pericardial effusion (noninflammatory): Secondary | ICD-10-CM | POA: Diagnosis not present

## 2021-12-20 DIAGNOSIS — Z741 Need for assistance with personal care: Secondary | ICD-10-CM | POA: Diagnosis not present

## 2021-12-20 DIAGNOSIS — E039 Hypothyroidism, unspecified: Secondary | ICD-10-CM | POA: Diagnosis not present

## 2021-12-20 DIAGNOSIS — D509 Iron deficiency anemia, unspecified: Secondary | ICD-10-CM | POA: Diagnosis not present

## 2021-12-20 DIAGNOSIS — M6281 Muscle weakness (generalized): Secondary | ICD-10-CM | POA: Diagnosis not present

## 2021-12-20 DIAGNOSIS — I10 Essential (primary) hypertension: Secondary | ICD-10-CM | POA: Diagnosis not present

## 2021-12-20 DIAGNOSIS — I5031 Acute diastolic (congestive) heart failure: Secondary | ICD-10-CM | POA: Diagnosis not present

## 2021-12-22 DIAGNOSIS — M6281 Muscle weakness (generalized): Secondary | ICD-10-CM | POA: Diagnosis not present

## 2021-12-22 DIAGNOSIS — E039 Hypothyroidism, unspecified: Secondary | ICD-10-CM | POA: Diagnosis not present

## 2021-12-22 DIAGNOSIS — I5031 Acute diastolic (congestive) heart failure: Secondary | ICD-10-CM | POA: Diagnosis not present

## 2021-12-22 DIAGNOSIS — I10 Essential (primary) hypertension: Secondary | ICD-10-CM | POA: Diagnosis not present

## 2021-12-22 DIAGNOSIS — Z741 Need for assistance with personal care: Secondary | ICD-10-CM | POA: Diagnosis not present

## 2021-12-22 DIAGNOSIS — D509 Iron deficiency anemia, unspecified: Secondary | ICD-10-CM | POA: Diagnosis not present

## 2021-12-23 DIAGNOSIS — L602 Onychogryphosis: Secondary | ICD-10-CM | POA: Diagnosis not present

## 2021-12-26 DIAGNOSIS — E039 Hypothyroidism, unspecified: Secondary | ICD-10-CM | POA: Diagnosis not present

## 2021-12-26 DIAGNOSIS — Z741 Need for assistance with personal care: Secondary | ICD-10-CM | POA: Diagnosis not present

## 2021-12-26 DIAGNOSIS — D509 Iron deficiency anemia, unspecified: Secondary | ICD-10-CM | POA: Diagnosis not present

## 2021-12-26 DIAGNOSIS — I5031 Acute diastolic (congestive) heart failure: Secondary | ICD-10-CM | POA: Diagnosis not present

## 2021-12-26 DIAGNOSIS — I10 Essential (primary) hypertension: Secondary | ICD-10-CM | POA: Diagnosis not present

## 2021-12-26 DIAGNOSIS — M6281 Muscle weakness (generalized): Secondary | ICD-10-CM | POA: Diagnosis not present

## 2021-12-27 DIAGNOSIS — I5031 Acute diastolic (congestive) heart failure: Secondary | ICD-10-CM | POA: Diagnosis not present

## 2021-12-27 DIAGNOSIS — M6281 Muscle weakness (generalized): Secondary | ICD-10-CM | POA: Diagnosis not present

## 2021-12-27 DIAGNOSIS — E039 Hypothyroidism, unspecified: Secondary | ICD-10-CM | POA: Diagnosis not present

## 2021-12-27 DIAGNOSIS — Z741 Need for assistance with personal care: Secondary | ICD-10-CM | POA: Diagnosis not present

## 2021-12-27 DIAGNOSIS — I10 Essential (primary) hypertension: Secondary | ICD-10-CM | POA: Diagnosis not present

## 2021-12-27 DIAGNOSIS — D509 Iron deficiency anemia, unspecified: Secondary | ICD-10-CM | POA: Diagnosis not present

## 2021-12-28 DIAGNOSIS — M6281 Muscle weakness (generalized): Secondary | ICD-10-CM | POA: Diagnosis not present

## 2021-12-28 DIAGNOSIS — D500 Iron deficiency anemia secondary to blood loss (chronic): Secondary | ICD-10-CM | POA: Diagnosis not present

## 2021-12-28 DIAGNOSIS — E039 Hypothyroidism, unspecified: Secondary | ICD-10-CM | POA: Diagnosis not present

## 2021-12-28 DIAGNOSIS — I5031 Acute diastolic (congestive) heart failure: Secondary | ICD-10-CM | POA: Diagnosis not present

## 2021-12-28 DIAGNOSIS — I5033 Acute on chronic diastolic (congestive) heart failure: Secondary | ICD-10-CM | POA: Diagnosis not present

## 2021-12-28 DIAGNOSIS — D509 Iron deficiency anemia, unspecified: Secondary | ICD-10-CM | POA: Diagnosis not present

## 2021-12-28 DIAGNOSIS — I10 Essential (primary) hypertension: Secondary | ICD-10-CM | POA: Diagnosis not present

## 2021-12-28 DIAGNOSIS — Z741 Need for assistance with personal care: Secondary | ICD-10-CM | POA: Diagnosis not present

## 2021-12-30 DIAGNOSIS — M6281 Muscle weakness (generalized): Secondary | ICD-10-CM | POA: Diagnosis not present

## 2021-12-30 DIAGNOSIS — I5031 Acute diastolic (congestive) heart failure: Secondary | ICD-10-CM | POA: Diagnosis not present

## 2021-12-30 DIAGNOSIS — Z741 Need for assistance with personal care: Secondary | ICD-10-CM | POA: Diagnosis not present

## 2021-12-30 DIAGNOSIS — E039 Hypothyroidism, unspecified: Secondary | ICD-10-CM | POA: Diagnosis not present

## 2021-12-30 DIAGNOSIS — I10 Essential (primary) hypertension: Secondary | ICD-10-CM | POA: Diagnosis not present

## 2021-12-30 DIAGNOSIS — D509 Iron deficiency anemia, unspecified: Secondary | ICD-10-CM | POA: Diagnosis not present

## 2022-01-02 DIAGNOSIS — I509 Heart failure, unspecified: Secondary | ICD-10-CM | POA: Diagnosis not present

## 2022-01-02 DIAGNOSIS — D509 Iron deficiency anemia, unspecified: Secondary | ICD-10-CM | POA: Diagnosis not present

## 2022-01-03 DIAGNOSIS — E039 Hypothyroidism, unspecified: Secondary | ICD-10-CM | POA: Diagnosis not present

## 2022-01-03 DIAGNOSIS — I10 Essential (primary) hypertension: Secondary | ICD-10-CM | POA: Diagnosis not present

## 2022-01-03 DIAGNOSIS — M6281 Muscle weakness (generalized): Secondary | ICD-10-CM | POA: Diagnosis not present

## 2022-01-03 DIAGNOSIS — H538 Other visual disturbances: Secondary | ICD-10-CM | POA: Diagnosis not present

## 2022-01-03 DIAGNOSIS — I5031 Acute diastolic (congestive) heart failure: Secondary | ICD-10-CM | POA: Diagnosis not present

## 2022-01-03 DIAGNOSIS — D509 Iron deficiency anemia, unspecified: Secondary | ICD-10-CM | POA: Diagnosis not present

## 2022-01-03 DIAGNOSIS — Z741 Need for assistance with personal care: Secondary | ICD-10-CM | POA: Diagnosis not present

## 2022-01-04 ENCOUNTER — Ambulatory Visit (INDEPENDENT_AMBULATORY_CARE_PROVIDER_SITE_OTHER): Payer: Medicare Other | Admitting: Podiatry

## 2022-01-04 DIAGNOSIS — M79675 Pain in left toe(s): Secondary | ICD-10-CM | POA: Diagnosis not present

## 2022-01-04 DIAGNOSIS — B351 Tinea unguium: Secondary | ICD-10-CM

## 2022-01-04 DIAGNOSIS — M79674 Pain in right toe(s): Secondary | ICD-10-CM | POA: Diagnosis not present

## 2022-01-04 NOTE — Progress Notes (Signed)
?  Subjective:  ?Patient ID: Cynthia Hill, female    DOB: [birth year],   MRN: [id number] ? ?Chief Complaint  ?Patient presents with  ? Nail Problem  ?   ?Routine nail care  ? ? ?71 y.o. female presents for concern of thickened elongated and painful nails that are difficult to trim. Requesting to have them trimmed today. Denies any burning or tingling in her feet. Patient is not diabetic.  ? . Denies any other pedal complaints. Denies n/v/f/c.  ? ?Past Medical History:  ?Diagnosis Date  ? CHF (congestive heart failure) ([place])   ? Chronic fatigue   ? Chronic pain   ? Fibroids   ? HTN (hypertension)   ? Migraine   ? Mononucleosis   ? ? ?Objective:  ?Physical Exam: ?Vascular: DP/PT pulses 2/4 bilateral. CFT <3 seconds. Normal hair growth on digits. No edema.  ?Skin. No lacerations or abrasions bilateral feet. Nails 1-5 b/l are thickened elongated and discolored with subungual debris.  ?Musculoskeletal: MMT 5/5 bilateral lower extremities in DF, PF, Inversion and Eversion. Deceased ROM in DF of ankle joint.  ?Neurological: Sensation intact to light touch.  ? ?Assessment:  ? ?1. Pain due to onychomycosis of toenails of both feet   ? ? ? ?Plan:  ?Patient was evaluated and treated and all questions answered. ?-Discussed and educated patient on foot care, especially with  ?regards to the vascular, neurological and musculoskeletal systems.  ?-Discussed supportive shoes at all times and checking feet regularly.  ?-Mechanically debrided all nails 1-5 bilateral using sterile nail nipper and filed with dremel without incident as courtesy  ?-Answered all patient questions ?-Patient to return  in 3 months for at risk foot care ?-Patient advised to call the office if any problems or questions arise in the meantime. ? ? ?Lorenda Peck, DPM  ? ? ?

## 2022-01-05 DIAGNOSIS — I5031 Acute diastolic (congestive) heart failure: Secondary | ICD-10-CM | POA: Diagnosis not present

## 2022-01-05 DIAGNOSIS — E039 Hypothyroidism, unspecified: Secondary | ICD-10-CM | POA: Diagnosis not present

## 2022-01-05 DIAGNOSIS — Z741 Need for assistance with personal care: Secondary | ICD-10-CM | POA: Diagnosis not present

## 2022-01-05 DIAGNOSIS — D509 Iron deficiency anemia, unspecified: Secondary | ICD-10-CM | POA: Diagnosis not present

## 2022-01-05 DIAGNOSIS — M6281 Muscle weakness (generalized): Secondary | ICD-10-CM | POA: Diagnosis not present

## 2022-01-05 DIAGNOSIS — I10 Essential (primary) hypertension: Secondary | ICD-10-CM | POA: Diagnosis not present

## 2022-01-06 DIAGNOSIS — M6281 Muscle weakness (generalized): Secondary | ICD-10-CM | POA: Diagnosis not present

## 2022-01-06 DIAGNOSIS — I5031 Acute diastolic (congestive) heart failure: Secondary | ICD-10-CM | POA: Diagnosis not present

## 2022-01-06 DIAGNOSIS — I10 Essential (primary) hypertension: Secondary | ICD-10-CM | POA: Diagnosis not present

## 2022-01-06 DIAGNOSIS — Z741 Need for assistance with personal care: Secondary | ICD-10-CM | POA: Diagnosis not present

## 2022-01-06 DIAGNOSIS — D509 Iron deficiency anemia, unspecified: Secondary | ICD-10-CM | POA: Diagnosis not present

## 2022-01-06 DIAGNOSIS — E039 Hypothyroidism, unspecified: Secondary | ICD-10-CM | POA: Diagnosis not present

## 2022-01-10 ENCOUNTER — Encounter: Payer: Self-pay | Admitting: Internal Medicine

## 2022-01-10 ENCOUNTER — Ambulatory Visit (INDEPENDENT_AMBULATORY_CARE_PROVIDER_SITE_OTHER): Payer: Medicare Other | Admitting: Internal Medicine

## 2022-01-10 VITALS — BP 108/52 | HR 86 | Ht 60.0 in | Wt 181.8 lb

## 2022-01-10 DIAGNOSIS — I3139 Other pericardial effusion (noninflammatory): Secondary | ICD-10-CM

## 2022-01-10 NOTE — Progress Notes (Signed)
?Cardiology Office Note:   ? ?Date:  01/10/2022  ? ?ID:  Cynthia Hill, DOB 12/31/1950, MRN [id number] ? ?PCP:  Lyman Bishop, DO ?  ?[facility] Providers ?Cardiologist:  Janina Mayo, MD    ? ?Referring MD: Lyman Bishop, DO  ? ?No chief complaint on file. ?SOB ? ?History of Present Illness:   ? ?Cynthia Hill is a 71 y.o. female with a hx of HTN, HFpEF, iron deficiency anemia ? ?She was admitted to the hospital with acute hypoxic respiratory failure. Concerning for HfPEF, BNP 640. Echo showed moderate pericardial effusion, no tamponade. Unknown etiology. She was started on colchicine and nsaids. Normal EF, grade II DDD. She also had c/f posterior leaflet prolapse. She was diuresed with IV lasix 40 mg BID.  She put out > 8 L. Notably she had significant anemia with hgb 4.6 contributed to her dyspnea. She received 2 units PRBCs. She received IV iron. She had frequent vaginal bleeding found to have a uterine mass. She was discharged on lasix 40 mg daily. She was started on losartan 25 mg daily. Was hesitant to start medications. She was seen at [facility] seen by a new provider. Dr. Curly Rim. She noted that she did not prefer to use Western medicine. She recently moved to [place] from [place]. Noted to have poor insight, may have some MCI. Her TSH is 12 which is very high and improved to 8.4. Her synthroid was increased to 100. Blood pressures are in a good range.  Marland Kitchen She was hospitalized March 2022 with decompensated heart failure. ? ?She has no LE edema, no PND or orthopnea. She weighs 178-182. She is taking a diuretic. She denies chest pressure.  She was diagnosed with uterine CA last Thursday per her brother. She lives on her own. Not adding salt to her foods ? ?Wt Readings from Last 3 Encounters:  ?01/10/22 181 lb 12.8 oz (82.5 kg)  ?11/23/21 202 lb 2.6 oz (91.7 kg)  ?12/18/20 204 lb 2.3 oz (92.6 kg)  ? ? ? ?Past Medical History:  ?Diagnosis Date  ? CHF (congestive heart failure)  ([place])   ? Chronic fatigue   ? Chronic pain   ? Fibroids   ? HTN (hypertension)   ? Migraine   ? Mononucleosis   ? ? ?No past surgical history on file. ? ?Current Medications: ?Current Meds  ?Medication Sig  ? ferrous sulfate 325 (65 FE) MG EC tablet Take 1 tablet (325 mg total) by mouth daily with breakfast.  ? furosemide (LASIX) 40 MG tablet Take 1 tablet (40 mg total) by mouth daily.  ? levothyroxine (SYNTHROID) 100 MCG tablet Take 100 mcg by mouth every morning.  ? losartan (COZAAR) 25 MG tablet Take 2 tablets (50 mg total) by mouth daily.  ? potassium chloride (KLOR-CON) 10 MEQ tablet Take 20 mEq by mouth daily.  ?  ? ?Allergies:   Aspirin, Sulfa antibiotics, Albuterol, Atenolol, Bactroban [mupirocin], Benzocaine, Bumex [bumetanide], Levaquin [levofloxacin], Morphine and related, Other, Propranolol, Ptu [propylthiouracil], Spironolactone, Tapazole [methimazole], Tetracyclines & related, Toprol xl [metoprolol], Triamterene, and Valium [diazepam]  ? ?Social History  ? ?Socioeconomic History  ? Marital status: Single  ?  Spouse name: Not on file  ? Number of children: Not on file  ? Years of education: Not on file  ? Highest education level: Not on file  ?Occupational History  ? Not on file  ?Tobacco Use  ? Smoking status: Never  ? Smokeless tobacco: Never  ?Substance and Sexual Activity  ?  Alcohol use: Never  ? Drug use: Never  ? Sexual activity: Not on file  ?Other Topics Concern  ? Not on file  ?Social History Narrative  ? Not on file  ? ?Social Determinants of Health  ? ?Financial Resource Strain: Not on file  ?Food Insecurity: Not on file  ?Transportation Needs: Not on file  ?Physical Activity: Not on file  ?Stress: Not on file  ?Social Connections: Not on file  ?  ? ?Family History: ?The patient's family history includes Blindness in her mother; Heart disease in her father and mother. ? ?ROS:   ?Please see the history of present illness.    ? All other systems reviewed and are negative. ? ?EKGs/Labs/Other  Studies Reviewed:   ? ?The following studies were reviewed today: ? ? ?EKG:  EKG is  ordered today.  The ekg ordered today demonstrates  ? ?EKG - 01/10/2022 NSR, LAFB ? ?Recent Labs: ?11/18/2021: ALT 14; B Natriuretic Peptide 640.1 ?11/19/2021: TSH 12.793 ?11/22/2021: Magnesium 1.8 ?11/24/2021: BUN 11; Creatinine, Ser 1.06; Hemoglobin 8.2; Platelets 140; Potassium 3.7; Sodium 139  ?Recent Lipid Panel ?No results found for: CHOL, TRIG, HDL, CHOLHDL, VLDL, LDLCALC, LDLDIRECT ? ? ?Risk Assessment/Calculations:   ?  ? ?Physical Exam:   ? ?VS: ? ?Vitals:  ? 01/10/22 1500  ?BP: (!) 108/52  ?Pulse: 86  ? ? ? ?  BP (!) 108/52   Pulse 86   Ht 5' (1.524 m)   Wt 181 lb 12.8 oz (82.5 kg)   BMI 35.51 kg/m?    ? ?Wt Readings from Last 3 Encounters:  ?01/10/22 181 lb 12.8 oz (82.5 kg)  ?11/23/21 202 lb 2.6 oz (91.7 kg)  ?12/18/20 204 lb 2.3 oz (92.6 kg)  ?  ? ?GEN:  Well nourished, well developed in no acute distress ?HEENT: Normal ?NECK: No JVD; No carotid bruits ?LYMPHATICS: No lymphadenopathy ?CARDIAC: RRR, no murmurs, rubs, gallops ?RESPIRATORY:  Clear to auscultation without rales, wheezing or rhonchi  ?ABDOMEN: Soft, non-tender, non-distended ?MUSCULOSKELETAL:  No edema; No deformity Venous stasis, no LE edema ?SKIN: Warm and dry ?NEUROLOGIC:  Alert and oriented x 3 ?PSYCHIATRIC:  Normal affect  ? ?ASSESSMENT:   ? ?#HFpEF: complicated by ?prolapse posterior leaflet, jet is anteriorly directed and MR is moderate. Crt in a good range baseline around 1. She is euvolemic today. Can continue her diuretic.  She takes her medications but not sure what they are for. She has signs of dementia. Her brother notes she has an organized system. Her brother helps.  ?- continue lasix 40 mg daily; K supplement ?- will continue to follow up and if symptoms progress repeat echo to assess her mitral valve ? ?#Pericardial Effusion: no pleuritic pain to suggest ongoing pericarditis. She can stop the colchicine post ~3 months of therapy. ? ?#HTN-  Good control. continue losartan 25 mg daily  ? ?PLAN:   ? ?In order of problems listed above: ? ?Follow up 3 months ? ?   ?Medication Adjustments/Labs and Tests Ordered: ?Current medicines are reviewed at length with the patient today.  Concerns regarding medicines are outlined above.  ?No orders of the defined types were placed in this encounter. ? ?No orders of the defined types were placed in this encounter. ? ? ?Patient Instructions  ?Medication Instructions:  ?No Changes In Medications at this time.  ?*If you need a refill on your cardiac medications before your next appointment, please call your pharmacy* ? ?Follow-Up: ?At [facility], you and your health needs are our  priority.  As part of our continuing mission to provide you with exceptional heart care, we have created designated Provider Care Teams.  These Care Teams include your primary Cardiologist (physician) and Advanced Practice Providers (APPs -  Physician Assistants and Nurse Practitioners) who all work together to provide you with the care you need, when you need it. ? ?Your next appointment:   ?3 month(s) ? ?The format for your next appointment:   ?In Person ? ?Provider:   ?Janina Mayo, MD   ? ?Important Information About Sugar ? ? ? ? ?   ? ?Signed, ?Janina Mayo, MD  ?01/10/2022 4:08 PM    ?[place] ?

## 2022-01-10 NOTE — Patient Instructions (Signed)
Medication Instructions:  ?No Changes In Medications at this time.  ?*If you need a refill on your cardiac medications before your next appointment, please call your pharmacy* ? ?Follow-Up: ?At [facility], you and your health needs are our priority.  As part of our continuing mission to provide you with exceptional heart care, we have created designated Provider Care Teams.  These Care Teams include your primary Cardiologist (physician) and Advanced Practice Providers (APPs -  Physician Assistants and Nurse Practitioners) who all work together to provide you with the care you need, when you need it. ? ?Your next appointment:   ?3 month(s) ? ?The format for your next appointment:   ?In Person ? ?Provider:   ?Janina Mayo, MD   ? ?Important Information About Sugar ? ? ? ? ?  ?

## 2022-01-11 DIAGNOSIS — I5031 Acute diastolic (congestive) heart failure: Secondary | ICD-10-CM | POA: Diagnosis not present

## 2022-01-11 DIAGNOSIS — E039 Hypothyroidism, unspecified: Secondary | ICD-10-CM | POA: Diagnosis not present

## 2022-01-11 DIAGNOSIS — I10 Essential (primary) hypertension: Secondary | ICD-10-CM | POA: Diagnosis not present

## 2022-01-11 DIAGNOSIS — M6281 Muscle weakness (generalized): Secondary | ICD-10-CM | POA: Diagnosis not present

## 2022-01-11 DIAGNOSIS — D509 Iron deficiency anemia, unspecified: Secondary | ICD-10-CM | POA: Diagnosis not present

## 2022-01-11 DIAGNOSIS — Z741 Need for assistance with personal care: Secondary | ICD-10-CM | POA: Diagnosis not present

## 2022-01-12 ENCOUNTER — Telehealth: Payer: Self-pay | Admitting: *Deleted

## 2022-01-12 ENCOUNTER — Encounter: Payer: Self-pay | Admitting: Gynecologic Oncology

## 2022-01-12 DIAGNOSIS — D509 Iron deficiency anemia, unspecified: Secondary | ICD-10-CM | POA: Diagnosis not present

## 2022-01-12 DIAGNOSIS — H40013 Open angle with borderline findings, low risk, bilateral: Secondary | ICD-10-CM | POA: Diagnosis not present

## 2022-01-12 DIAGNOSIS — H0520 Unspecified exophthalmos: Secondary | ICD-10-CM | POA: Diagnosis not present

## 2022-01-12 DIAGNOSIS — I10 Essential (primary) hypertension: Secondary | ICD-10-CM | POA: Diagnosis not present

## 2022-01-12 DIAGNOSIS — H5021 Vertical strabismus, right eye: Secondary | ICD-10-CM | POA: Diagnosis not present

## 2022-01-12 DIAGNOSIS — E039 Hypothyroidism, unspecified: Secondary | ICD-10-CM | POA: Diagnosis not present

## 2022-01-12 DIAGNOSIS — M6281 Muscle weakness (generalized): Secondary | ICD-10-CM | POA: Diagnosis not present

## 2022-01-12 DIAGNOSIS — Z741 Need for assistance with personal care: Secondary | ICD-10-CM | POA: Diagnosis not present

## 2022-01-12 DIAGNOSIS — I5031 Acute diastolic (congestive) heart failure: Secondary | ICD-10-CM | POA: Diagnosis not present

## 2022-01-12 DIAGNOSIS — H5213 Myopia, bilateral: Secondary | ICD-10-CM | POA: Diagnosis not present

## 2022-01-12 DIAGNOSIS — H53462 Homonymous bilateral field defects, left side: Secondary | ICD-10-CM | POA: Diagnosis not present

## 2022-01-12 DIAGNOSIS — H2513 Age-related nuclear cataract, bilateral: Secondary | ICD-10-CM | POA: Diagnosis not present

## 2022-01-12 NOTE — Telephone Encounter (Signed)
Spoke with the patient's brother and scheduled a new patient appt with Dr Berline Lopes on 4/14 at 59 am; brother given arrival time of 8:30 am. Brother given the address and phone number for the clinic; along with the policy for mask and visitors  ?

## 2022-01-12 NOTE — Progress Notes (Signed)
GYNECOLOGIC ONCOLOGY NEW PATIENT CONSULTATION  ? ?Patient Name: Cynthia Hill  ?Patient Age: 71 y.o. ?Date of Service: 01/13/2022 ?Referring Provider: Thurnell Lose, MD ? ?Primary Care Provider: Lyman Bishop, DO ?Consulting Provider: Jeral Pinch, MD  ? ?Assessment/Plan:  ?Postmenopausal patient with low-grade endometrial adenocarcinoma. ? ?We reviewed the nature of endometrial cancer and its recommended surgical staging, including total hysterectomy, bilateral salpingo-oophorectomy, and lymph node assessment.  But I suspect the patient is a suitable candidate for staging via a minimally invasive approach to surgery, given her medical comorbidities, we will need clearance from her primary care provider as well as cardiology.  Patient recently saw an ophthalmologist who was worried that her visual symptoms may be related to history of a stroke.  It sounds as though they will be pursuing an MRI to rule this out. ? ?We discussed that most endometrial cancer is detected early and that decisions regarding adjuvant therapy will be made based on her final pathology.  Although low-grade, I worry some that given the duration of bleeding symptoms, the patient may be at increased risk of extrauterine disease. ? ?With regard to surgery, we discussed that surgery would be undertaken in a minimally invasive manner using a robotic approach.  We reviewed the sentinel lymph node technique. Risks and benefits of sentinel lymph node biopsy was reviewed. We reviewed the technique and ICG dye. The patient DOES NOT have an iodine allergy or known liver dysfunction. We reviewed the false negative rate (0.4%), and that 3% of patients with metastatic disease will not have it detected by SLN biopsy in endometrial cancer. A low risk of allergic reaction to the dye, <0.2% for ICG, has been reported. We also discussed that in the case of failed mapping, which occurs 40% of the time, a bilateral or unilateral lymphadenectomy will  be performed at the surgeon?s discretion.  ? ?Potential benefits of sentinel nodes including a higher detection rate for metastasis due to ultrastaging and potential reduction in operative morbidity. However, there remains uncertainty as to the role for treatment of micrometastatic disease. Further, the benefit of operative morbidity associated with the SLN technique in endometrial cancer is not yet completely known. In other patient populations (e.g. the cervical cancer population) there has been observed reductions in morbidity with SLN biopsy compared to pelvic lymphadenectomy. Lymphedema, nerve dysfunction and lymphocysts are all potential risks with the SLN technique as with complete lymphadenectomy. Additional risks to the patient include the risk of damage to an internal organ while operating in an altered view (e.g. the black and white image of the robotic fluorescence imaging mode).  ? ?The patient had significant hesitations about surgery and voiced that she was not interested in surgery and did not think that she was strong enough or healthy enough to undergo major surgery.  She is interested in hearing about other treatment options.  Thus, we discussed that in patients who declined surgery or are not healthy enough to withstand surgery, we can use hormonal therapy with progesterone or radiation. ? ?We reviewed the role of progesterone therapy and the effect on preneoplastic and neoplastic lesions, believed to include induction of apoptosis in addition to tissue sloughing during withdrawal bleeding.  Activation of the progesterone receptors is believed to lead stromal decidualization and thinning of the lining.  We reviewed the 3 most studied options, to include levonorgesterol IUD, oral medorxyprogesterone acetate, or oral megesterol acetate.  We discussed that all options have few side effects, most common being infrequent edema, GI disturbances, and thromboembolic  events), but that local progesterone  through IUD may have a stronger effect on the endometrium with less systemic side effects.   ? ?If we were to proceed with either progesterone or radiation therapy, the neck step would be to get imaging to rule out metastatic disease. ? ?After lengthy discussion, the patient continued to voice that she did not think she was interested in any treatment options.  We discussed that no cancer directed therapy is an option but I worry that she will continue to have vaginal bleeding and may develop increased bleeding.  She has required admission as well as multiple blood transfusions since she has been in [place].  I discussed the utility of giving blood transfusions in the setting of her cancer if she elects not to proceed with any cancer directed therapy.  I think that even considering some palliative radiation to slow or stop her bleeding would be beneficial. ? ?Patient would like some time to think about our conversation today and discussed with her family.  I offered a phone visit in the near future, which she was amenable to scheduling.  We will discuss by phone in 2 weeks any further question she has and what her decision is with regard to pursuing treatment. ? ?A copy of this note was sent to the patient's referring provider.  ? ?75 minutes of total time was spent for this patient encounter, including preparation, face-to-face counseling with the patient and coordination of care, and documentation of the encounter. ? ? ?Jeral Pinch, MD  ?Division of Gynecologic Oncology  ?Department of Obstetrics and Gynecology  ?[facility]  ?___________________________________________  ?Chief Complaint: ?No chief complaint on file. ? ? ?History of Present Illness:  ?Cynthia Hill is a 71 y.o. y.o. female who is seen in consultation at the request of Dr. Simona Huh for an evaluation of endometrial cancer. ? ?The patient was admitted in March 2022 with progressive fatigue and dyspnea.   Hemoglobin at time of presentation was 5.3 and blood work indicated severe iron deficiency anemia.  At that time, the patient endorsed intermittent vaginal bleeding with passage of clots.  She also endorsed a history of uterine fibroids.  She declined pelvic exam at that time and GYN, who had been consulted, recommended outpatient follow-up.  Patient was transfused 2 units of packed red blood cells and received Feraheme during that admission.  Pelvic ultrasound exam performed on 12/15/20 shows a uterus measuring 11.3 x 5.5 x 5.5 cm with a rounded, echogenic structure in the central uterus measuring 6 x 5 x 7.2 cm, with note that this is difficult to differentiate between myometrial and endometrial lesion.  Neither ovary well visualized.  ? ?The patient was recently hospitalized again in February 2023.  She presented initially with shortness of breath and symptomatic anemia with her hemoglobin was noted to be 4.6.  She received 2 units of packed red blood cells as whereas Feraheme with a discharge hemoglobin of 8.2.  Admission also notable for CHF exacerbation and pericardial effusion.   ? ?Pelvic ultrasound exam at [facility] on 01/04/2022 reveals uterus measuring 12.3 x 6.2 x 7.4 cm with an endometrial lining not well seen.  Mass measuring up to 7.2 cm suspected to be mid body uterine fibroid versus abnormally thickened endometrium.  Ultrasound exam was transabdominal only.  Endometrial biopsy on 01/05/2022 shows well differentiated endometrial adenocarcinoma. ? ?Today, the patient reports doing well.  She notes having a good appetite.  Has rare nausea, denies any emesis.  Has intermittent constipation, otherwise endorses normal bowel function.  Has some baseline urinary frequency and urgency, denies any bladder symptoms.  She endorses fatigue at times, energy has improved since recent hospitalization.  She denies any pelvic pain or cramping.   ? ?After discharge, she had heavier bleeding for several weeks, her  bleeding has become lighter and her brother describes it more as spotting now.  Is not able to lay flat, usually sleeps on her side, becomes short of breath if she is laying with her head down.  Denies chest pain

## 2022-01-13 ENCOUNTER — Encounter: Payer: Self-pay | Admitting: Gynecologic Oncology

## 2022-01-13 ENCOUNTER — Inpatient Hospital Stay: Payer: Medicare Other | Attending: Gynecologic Oncology | Admitting: Gynecologic Oncology

## 2022-01-13 ENCOUNTER — Other Ambulatory Visit: Payer: Self-pay

## 2022-01-13 VITALS — BP 127/45 | HR 82 | Temp 98.2°F | Resp 20 | Ht 60.0 in | Wt 182.0 lb

## 2022-01-13 DIAGNOSIS — G8929 Other chronic pain: Secondary | ICD-10-CM | POA: Insufficient documentation

## 2022-01-13 DIAGNOSIS — I110 Hypertensive heart disease with heart failure: Secondary | ICD-10-CM | POA: Insufficient documentation

## 2022-01-13 DIAGNOSIS — D500 Iron deficiency anemia secondary to blood loss (chronic): Secondary | ICD-10-CM | POA: Insufficient documentation

## 2022-01-13 DIAGNOSIS — C541 Malignant neoplasm of endometrium: Secondary | ICD-10-CM | POA: Diagnosis not present

## 2022-01-13 DIAGNOSIS — D649 Anemia, unspecified: Secondary | ICD-10-CM

## 2022-01-13 DIAGNOSIS — Z86018 Personal history of other benign neoplasm: Secondary | ICD-10-CM | POA: Diagnosis not present

## 2022-01-13 DIAGNOSIS — E039 Hypothyroidism, unspecified: Secondary | ICD-10-CM | POA: Diagnosis not present

## 2022-01-13 DIAGNOSIS — I509 Heart failure, unspecified: Secondary | ICD-10-CM | POA: Insufficient documentation

## 2022-01-13 DIAGNOSIS — R413 Other amnesia: Secondary | ICD-10-CM

## 2022-01-13 DIAGNOSIS — E669 Obesity, unspecified: Secondary | ICD-10-CM

## 2022-01-13 DIAGNOSIS — Z79899 Other long term (current) drug therapy: Secondary | ICD-10-CM | POA: Diagnosis not present

## 2022-01-13 NOTE — Patient Instructions (Addendum)
It was very nice to meet you both today. ? ?As we discussed, your recent biopsy shows a low-grade cancer within the lining of your uterus.  At this time, based on my exam, I do not see evidence that the cancer has spread outside of the uterus, but I am not able to say what stage the cancer is.  We typically use surgery to help determine that. ? ?For someone who is healthy enough to have surgery, the standard of care treatment for this type of cancer is a total hysterectomy (removal of the uterus, cervix, tubes, and ovaries) as well as sampling of pelvic lymph nodes to test them for cancer spread.  The surgery is done most frequently as outpatient surgery in a minimally invasive manner using small incisions.  Because of the size of your uterus, you would likely require one of the incisions to be a little bit bigger to remove the uterus. ? ?We also discussed other treatment options if you do not want to proceed with surgery.  One would be to use the hormone progesterone, which helps to stop the bleeding and can make the cancer cells become normal cells.  We can use either progesterone by mouth (which has to be high dose and has side effects that we talked about today including increased appetite, leg swelling, and small increased risk in blood clot) before using an intrauterine device.  I have some concerns about the feasibility of using an intrauterine device given the mass seen on your ultrasound with in the center of the uterus.  This may be a fibroid or it may be the lining of the uterus itself.  We could attempt placement of an IUD although I would want to do this under some light anesthesia so that I could take a look inside the uterus or the camera. ? ?The third option would be to use radiation treatment.  With either the second or the third option, I would recommend that we get imaging before proceeding with treatment to make sure were not seeing evidence of metastatic disease (cancer spread outside of the  uterus). ? ?The fourth option that we discussed today, given your hesitancy for any treatment, would be to focus on your symptoms.  I am concerned that without some treatment, your vaginal bleeding will continue and ultimately get more heavy.  You have already required multiple blood transfusions since you have been in [place] secondary to anemia from your blood loss. ? ?If we are going to proceed with plan for surgery, we will need to get clearance from a medical standpoint from your primary care doctor as well as a heart doctor. ? ?We will have a phone visit soon to check in after you and your family have had some time to think and discuss these options. ? ?If you have made a decision about how you would like to proceed before our phone visit, please call to speak to me at [phone]. ?

## 2022-01-14 DIAGNOSIS — E669 Obesity, unspecified: Secondary | ICD-10-CM | POA: Insufficient documentation

## 2022-01-14 DIAGNOSIS — C541 Malignant neoplasm of endometrium: Secondary | ICD-10-CM | POA: Insufficient documentation

## 2022-01-14 DIAGNOSIS — R413 Other amnesia: Secondary | ICD-10-CM | POA: Insufficient documentation

## 2022-01-16 ENCOUNTER — Other Ambulatory Visit: Payer: Self-pay | Admitting: Family Medicine

## 2022-01-16 DIAGNOSIS — E039 Hypothyroidism, unspecified: Secondary | ICD-10-CM | POA: Diagnosis not present

## 2022-01-16 DIAGNOSIS — D500 Iron deficiency anemia secondary to blood loss (chronic): Secondary | ICD-10-CM | POA: Diagnosis not present

## 2022-01-16 DIAGNOSIS — I3139 Other pericardial effusion (noninflammatory): Secondary | ICD-10-CM | POA: Diagnosis not present

## 2022-01-16 DIAGNOSIS — I10 Essential (primary) hypertension: Secondary | ICD-10-CM | POA: Diagnosis not present

## 2022-01-16 DIAGNOSIS — H538 Other visual disturbances: Secondary | ICD-10-CM | POA: Diagnosis not present

## 2022-01-16 DIAGNOSIS — I5033 Acute on chronic diastolic (congestive) heart failure: Secondary | ICD-10-CM | POA: Diagnosis not present

## 2022-01-16 DIAGNOSIS — R413 Other amnesia: Secondary | ICD-10-CM | POA: Diagnosis not present

## 2022-01-17 ENCOUNTER — Other Ambulatory Visit: Payer: Medicare Other

## 2022-01-17 DIAGNOSIS — I10 Essential (primary) hypertension: Secondary | ICD-10-CM | POA: Diagnosis not present

## 2022-01-17 DIAGNOSIS — E039 Hypothyroidism, unspecified: Secondary | ICD-10-CM | POA: Diagnosis not present

## 2022-01-17 DIAGNOSIS — D509 Iron deficiency anemia, unspecified: Secondary | ICD-10-CM | POA: Diagnosis not present

## 2022-01-17 DIAGNOSIS — I5031 Acute diastolic (congestive) heart failure: Secondary | ICD-10-CM | POA: Diagnosis not present

## 2022-01-17 DIAGNOSIS — M6281 Muscle weakness (generalized): Secondary | ICD-10-CM | POA: Diagnosis not present

## 2022-01-17 DIAGNOSIS — Z741 Need for assistance with personal care: Secondary | ICD-10-CM | POA: Diagnosis not present

## 2022-01-18 ENCOUNTER — Telehealth: Payer: Self-pay | Admitting: Internal Medicine

## 2022-01-18 ENCOUNTER — Ambulatory Visit
Admission: RE | Admit: 2022-01-18 | Discharge: 2022-01-18 | Disposition: A | Discharge status: Home / Self Care | Payer: Medicare Other | Source: Ambulatory Visit | Attending: Family Medicine | Admitting: Family Medicine

## 2022-01-18 ENCOUNTER — Other Ambulatory Visit: Payer: Self-pay | Admitting: Family Medicine

## 2022-01-18 DIAGNOSIS — H538 Other visual disturbances: Secondary | ICD-10-CM

## 2022-01-18 NOTE — Telephone Encounter (Signed)
Patient's brother is calling stating the patient was diagnosed with uterine cancer.  Dr. Berline Lopes requested he call to discuss with Dr. Harl Bowie if she feels the patient is healthy enough to undergo a hysterectomy. [name] states you can reach him through the patient's my chart if he does not answer when calling back. Please advise.  ?

## 2022-01-18 NOTE — Telephone Encounter (Signed)
Contacted brother back. Advised I could send the message over to make Dr.Branch aware. However we would need a cardiac clearance for documentation purposes. Patient brother aware of this, he will go with her to doctor visits this week and notify them so we can have them sent.  ? ?Patient brother verbalized understanding, thankful for call back. ?

## 2022-01-19 ENCOUNTER — Ambulatory Visit (INDEPENDENT_AMBULATORY_CARE_PROVIDER_SITE_OTHER): Payer: Medicare Other | Admitting: Neurology

## 2022-01-19 ENCOUNTER — Encounter: Payer: Self-pay | Admitting: Neurology

## 2022-01-19 VITALS — BP 148/85 | HR 90 | Ht 60.0 in | Wt 183.0 lb

## 2022-01-19 DIAGNOSIS — I10 Essential (primary) hypertension: Secondary | ICD-10-CM | POA: Diagnosis not present

## 2022-01-19 DIAGNOSIS — M6281 Muscle weakness (generalized): Secondary | ICD-10-CM | POA: Diagnosis not present

## 2022-01-19 DIAGNOSIS — Z741 Need for assistance with personal care: Secondary | ICD-10-CM | POA: Diagnosis not present

## 2022-01-19 DIAGNOSIS — R413 Other amnesia: Secondary | ICD-10-CM | POA: Diagnosis not present

## 2022-01-19 DIAGNOSIS — I5031 Acute diastolic (congestive) heart failure: Secondary | ICD-10-CM | POA: Diagnosis not present

## 2022-01-19 DIAGNOSIS — D509 Iron deficiency anemia, unspecified: Secondary | ICD-10-CM | POA: Diagnosis not present

## 2022-01-19 DIAGNOSIS — E039 Hypothyroidism, unspecified: Secondary | ICD-10-CM | POA: Diagnosis not present

## 2022-01-19 MED ORDER — ALPRAZOLAM 1 MG PO TABS: ORAL_TABLET | ORAL | 0 refills | Status: DC

## 2022-01-19 NOTE — Progress Notes (Addendum)
Chief Complaint  Patient presents with   New Patient (Initial Visit)    Rm 14. Accompanied by brother, [name]. NP/[employer]/[facility] [place] DO/memory deficits. [place] 15/30.      ASSESSMENT AND PLAN  Cynthia Hill is a 71 y.o. female   Acute onset of memory loss  MoCA examination 15/30  Probable uterine cancer  Severe claustrophobia  Out MRI of the brain with without contrast, to be done in an open MRI premedicated with Xanax   DIAGNOSTIC DATA (LABS, IMAGING, TESTING) - I reviewed patient records, labs, notes, testing and imaging myself where available.   MEDICAL HISTORY:  Cynthia Hill is a 71 years old female, seen in request by her primary care doctor Lyman Bishop, for evaluation of acute onset of memory loss, she is accompanied by her brother at today's visit January 19, 2022  I reviewed and summarized the referring note. PMHX  Patient used to be a Customer service manager, went on disability in her 64s due to fibromyalgia, per brother, she suffered severe wound infection following a fall while she lived in [place], brother moved her to [facility] around 2020  Reviewed hospital discharge March 2022 for severe anemia hemoglobin of 5.3, in the setting of intermittent vaginal bleeding, at that time she was found to have a uterine mass, hypothyroidism, was put on supplement, pitting edema, moderate pericardial effusion, hypertensive urgency, hypokalemia, potassium was 2.7, morbid obese.  Patient apparently not taking her medication regularly, hospital admission again in February 2023 for symptomatic iron deficiency anemia, hemoglobin of 4.6, required blood transfusion, acute diastolic congestive heart failure exacerbation, pericardial effusion, echocardiogram showed ejection fraction of 60 to 65%, significantly elevated TSH, hypothyroidism, she did have a history of radioactive iodine treatment in the past, continued vaginal  bleeding, worrisome for uterine cancer, hypertension  Brother reported that following hospital admission in February 2023, she was noted to have sudden onset of memory loss, MoCA examination 15/30 today, she rely on her brother to provide history,  There was no visual change, lateralized motor or sensory deficit noted  Laboratory evaluation February 2023, anemia, hemoglobin of 8.2, decreased MCV of 78.5, RDW was elevated 25.5, slight elevation of creatinine 1.01, CRP was elevated 9.9, ESR 30. A1 C 4.8, TSH 12.8, B12 762   She attempted MRI of brain, could not tolerated due to claustrophobia, hospital record also documented that she refused pelvic examination work-up for uterine mass in the past  PHYSICAL EXAM:   Vitals:   01/19/22 1429  BP: (!) 148/85  Pulse: 90  Weight: 183 lb (83 kg)  Height: 5' (1.524 m)   Not recorded     Body mass index is 35.74 kg/m.  PHYSICAL EXAMNIATION:  Gen: NAD, conversant, well nourised, well groomed                     Cardiovascular: Regular rate rhythm, no peripheral edema, warm, nontender. Eyes: Conjunctivae clear without exudates or hemorrhage Neck: Supple, no carotid bruits. Pulmonary: Clear to auscultation bilaterally   NEUROLOGICAL EXAM:  MENTAL STATUS: Speech:    Speech is normal; fluent and spontaneous with normal comprehension.  Cognition:       01/19/2022    2:30 PM  Montreal Cognitive Assessment   Visuospatial/ Executive (0/5) 2  Naming (0/3) 3  Attention: Read list of digits (0/2) 2  Attention: Read list of letters (0/1) 1  Attention: Serial 7 subtraction starting at 100 (0/3) 1  Language: Repeat phrase (  0/2) 2  Language : Fluency (0/1) 0  Abstraction (0/2) 2  Delayed Recall (0/5) 0  Orientation (0/6) 2  Total 15  Adjusted Score (based on education) 15      CRANIAL NERVES: CN II: Visual fields are full to confrontation. Pupils are round equal and briskly reactive to light. CN III, IV, VI: extraocular movement are  normal. No ptosis. CN V: Facial sensation is intact to light touch CN VII: Face is symmetric with normal eye closure  CN VIII: Hearing is normal to causal conversation. CN IX, X: Phonation is normal. CN XI: Head turning and shoulder shrug are intact  MOTOR: There is no pronator drift of out-stretched arms. Muscle bulk and tone are normal. Muscle strength is normal.  REFLEXES: Reflexes are hypoactive and symmetric at the biceps, triceps, knees, and ankles. Plantar responses are flexor.  SENSORY: Intact to light touch, pinprick and vibratory sensation are intact in fingers and toes.  COORDINATION: There is no trunk or limb dysmetria noted.  GAIT/STANCE: She needs push-up to get up from seated position, wide-based, unsteady cautious  REVIEW OF SYSTEMS:  Full 14 system review of systems performed and notable only for as above All other review of systems were negative.   ALLERGIES: Allergies  Allergen Reactions   Aspirin Other (See Comments)    unknown   Sulfa Antibiotics Other (See Comments)    unknown   Albuterol Other (See Comments)    Worsens asthma   Atenolol Other (See Comments)    unknown   Bactroban [Mupirocin] Swelling   Benzocaine Other (See Comments)    Throat swelling   Bumex [Bumetanide] Other (See Comments)    Throat swelling   Levaquin [Levofloxacin] Other (See Comments)    unknown   Morphine And Related Other (See Comments)    unknown   Other Other (See Comments)    Sea food, shell fish   Propranolol Other (See Comments)    unknown   Ptu [Propylthiouracil] Other (See Comments)    Doubles heart rate   Spironolactone Other (See Comments)    Throat swelling   Tapazole [Methimazole] Other (See Comments)    Irregular heartbeat   Tetracyclines & Related Other (See Comments)    Throat swelling   Toprol Xl [Metoprolol] Other (See Comments)    unknown   Triamterene Other (See Comments)    unknown   Valium [Diazepam] Other (See Comments)    unknown     HOME MEDICATIONS: Current Outpatient Medications  Medication Sig Dispense Refill   levothyroxine (SYNTHROID) 100 MCG tablet Take 100 mcg by mouth every morning.     potassium chloride (KLOR-CON) 10 MEQ tablet Take 20 mEq by mouth daily.     ferrous sulfate 325 (65 FE) MG EC tablet Take 1 tablet (325 mg total) by mouth daily with breakfast. 30 tablet 0   furosemide (LASIX) 40 MG tablet Take 1 tablet (40 mg total) by mouth daily. 30 tablet 0   losartan (COZAAR) 25 MG tablet Take 2 tablets (50 mg total) by mouth daily. 60 tablet 0   No current facility-administered medications for this visit.    PAST MEDICAL HISTORY: Past Medical History:  Diagnosis Date   Anemia    CHF (congestive heart failure) (HCC)    Chronic fatigue    Chronic pain    Fibroids    HTN (hypertension)    Hypothyroidism    Migraine    Mononucleosis     PAST SURGICAL HISTORY: Past Surgical History:  Procedure Laterality  Date   TONSILLECTOMY      FAMILY HISTORY: Family History  Problem Relation Age of Onset   Heart disease Mother    Blindness Mother    Heart disease Father    Prostate cancer Father    Stomach cancer Paternal Aunt    Endometrial cancer Neg Hx    Ovarian cancer Neg Hx    Pancreatic cancer Neg Hx    Colon cancer Neg Hx    Breast cancer Neg Hx     SOCIAL HISTORY: Social History   Socioeconomic History   Marital status: Single    Spouse name: Not on file   Number of children: Not on file   Years of education: Not on file   Highest education level: Not on file  Occupational History   Not on file  Tobacco Use   Smoking status: Never   Smokeless tobacco: Never  Vaping Use   Vaping Use: Never used  Substance and Sexual Activity   Alcohol use: Never   Drug use: Never   Sexual activity: Not on file  Other Topics Concern   Not on file  Social History Narrative   Not on file   Social Determinants of Health   Financial Resource Strain: Not on file  Food Insecurity: Not  on file  Transportation Needs: Not on file  Physical Activity: Not on file  Stress: Not on file  Social Connections: Not on file  Intimate Partner Violence: Not on file      Marcial Pacas, M.D. Ph.D.  [facility] [address] [place], [place] [address] Ph: [phone] Fax: [phone]  CC:  Lyman Bishop, DO [place],  [place] [id number]  Masneri, Adele Barthel, DO

## 2022-01-20 DIAGNOSIS — I5031 Acute diastolic (congestive) heart failure: Secondary | ICD-10-CM | POA: Diagnosis not present

## 2022-01-20 DIAGNOSIS — I10 Essential (primary) hypertension: Secondary | ICD-10-CM | POA: Diagnosis not present

## 2022-01-20 DIAGNOSIS — D509 Iron deficiency anemia, unspecified: Secondary | ICD-10-CM | POA: Diagnosis not present

## 2022-01-20 DIAGNOSIS — M6281 Muscle weakness (generalized): Secondary | ICD-10-CM | POA: Diagnosis not present

## 2022-01-20 DIAGNOSIS — E039 Hypothyroidism, unspecified: Secondary | ICD-10-CM | POA: Diagnosis not present

## 2022-01-20 DIAGNOSIS — Z741 Need for assistance with personal care: Secondary | ICD-10-CM | POA: Diagnosis not present

## 2022-01-23 ENCOUNTER — Telehealth: Payer: Self-pay | Admitting: Neurology

## 2022-01-23 ENCOUNTER — Telehealth: Payer: Self-pay | Admitting: Gynecologic Oncology

## 2022-01-23 ENCOUNTER — Inpatient Hospital Stay: Payer: Medicare Other | Admitting: Gynecologic Oncology

## 2022-01-23 DIAGNOSIS — Z741 Need for assistance with personal care: Secondary | ICD-10-CM | POA: Diagnosis not present

## 2022-01-23 DIAGNOSIS — D509 Iron deficiency anemia, unspecified: Secondary | ICD-10-CM | POA: Diagnosis not present

## 2022-01-23 DIAGNOSIS — M6281 Muscle weakness (generalized): Secondary | ICD-10-CM | POA: Diagnosis not present

## 2022-01-23 DIAGNOSIS — I5031 Acute diastolic (congestive) heart failure: Secondary | ICD-10-CM | POA: Diagnosis not present

## 2022-01-23 DIAGNOSIS — I10 Essential (primary) hypertension: Secondary | ICD-10-CM | POA: Diagnosis not present

## 2022-01-23 DIAGNOSIS — E039 Hypothyroidism, unspecified: Secondary | ICD-10-CM | POA: Diagnosis not present

## 2022-01-23 NOTE — Telephone Encounter (Signed)
Cynthia Hill with [facility] sent me a message: ? ?"Spoke to patient, Pt is very claustro and does not wants nothing to do with this and will talk brother about the exam and call back if she wants to schedule. She also ask me the diagnosis for her exam, (memory loss). I will call her brother back he didn't want to schedule and told me to call her. So I am not sure if they will schedule." ?

## 2022-01-23 NOTE — Telephone Encounter (Signed)
Called the patient about 30 minutes before scheduled visit. Her brother had just arrived. The brother initially voices that they'd like to move forward with surgery is she is cleared for that. The patient then raises significant concerns about surgery. I reviewed all treatment options again and the patient notes that she thinks these are all "horrible" treatment options and asks why she would want to do any of these. After saying she didn't have enough information about treatments, I asked what additional information I could provide to help her with this decision. She'd like some time to talk with her brother. I offered I could call back after my next surgery or we could schedule a phone visit for 4:30pm tomorrow. There preference was to do the phone visit tomorrow. ? ?Jeral Pinch MD ?Gynecologic Oncology ? ? ?

## 2022-01-23 NOTE — Telephone Encounter (Signed)
[employer] medicare/medicaid no auth req faxed order to [facility], they will reach out to the patient to schedule  ?

## 2022-01-24 ENCOUNTER — Inpatient Hospital Stay (HOSPITAL_BASED_OUTPATIENT_CLINIC_OR_DEPARTMENT_OTHER): Payer: Medicare Other | Admitting: Gynecologic Oncology

## 2022-01-24 ENCOUNTER — Encounter: Payer: Self-pay | Admitting: Gynecologic Oncology

## 2022-01-24 DIAGNOSIS — R413 Other amnesia: Secondary | ICD-10-CM | POA: Diagnosis not present

## 2022-01-24 DIAGNOSIS — C541 Malignant neoplasm of endometrium: Secondary | ICD-10-CM

## 2022-01-24 NOTE — Telephone Encounter (Signed)
Attempted to call patients brother (okay per DPR) left message stating that once surgical clearance is received from surgeons office our pre op team will review and give recommendations from that point. Advised in message to call back with any concerns. Call back number given.  ?

## 2022-01-24 NOTE — Telephone Encounter (Signed)
Noted, I sent Larene Beach a message.  ?

## 2022-01-24 NOTE — Progress Notes (Signed)
Gynecologic Oncology Telehealth Consult Note: Gyn-Onc ? ?I connected with [name] on 01/24/22 at  4:30 PM EDT by telephone and verified that I am speaking with the correct person using two identifiers. ? ?I discussed the limitations, risks, security and privacy concerns of performing an evaluation and management service by telemedicine and the availability of in-person appointments. I also discussed with the patient that there may be a patient responsible charge related to this service. The patient expressed understanding and agreed to proceed. ? ?Other persons participating in the visit and their role in the encounter: Patient's brother. ? ?Patient's location: Home ?Provider's location: [facility] ? ?Reason for Visit: Follow-up regarding treatment discussion ? ?Treatment History: ?The patient was admitted in March 2022 with progressive fatigue and dyspnea.  Hemoglobin at time of presentation was 5.3 and blood work indicated severe iron deficiency anemia.  At that time, the patient endorsed intermittent vaginal bleeding with passage of clots.  She also endorsed a history of uterine fibroids.  She declined pelvic exam at that time and GYN, who had been consulted, recommended outpatient follow-up.  Patient was transfused 2 units of packed red blood cells and received Feraheme during that admission.  Pelvic ultrasound exam performed on 12/15/20 shows a uterus measuring 11.3 x 5.5 x 5.5 cm with a rounded, echogenic structure in the central uterus measuring 6 x 5 x 7.2 cm, with note that this is difficult to differentiate between myometrial and endometrial lesion.  Neither ovary well visualized.  ?  ?The patient was recently hospitalized again in February 2023.  She presented initially with shortness of breath and symptomatic anemia with her hemoglobin was noted to be 4.6.  She received 2 units of packed red blood cells as whereas Feraheme with a discharge hemoglobin of 8.2.  Admission also notable for CHF  exacerbation and pericardial effusion.   ?  ?Pelvic ultrasound exam at [facility] on 01/04/2022 reveals uterus measuring 12.3 x 6.2 x 7.4 cm with an endometrial lining not well seen.  Mass measuring up to 7.2 cm suspected to be mid body uterine fibroid versus abnormally thickened endometrium.  Ultrasound exam was transabdominal only.  Endometrial biopsy on 01/05/2022 shows well differentiated endometrial adenocarcinoma. ?  ?Today, the patient reports doing well.  She notes having a good appetite.  Has rare nausea, denies any emesis.  Has intermittent constipation, otherwise endorses normal bowel function.  Has some baseline urinary frequency and urgency, denies any bladder symptoms.  She endorses fatigue at times, energy has improved since recent hospitalization.  She denies any pelvic pain or cramping.   ?  ?After discharge, she had heavier bleeding for several weeks, her bleeding has become lighter and her brother describes it more as spotting now.  Is not able to lay flat, usually sleeps on her side, becomes short of breath if she is laying with her head down.  Denies chest pain at rest or with ambulation. ? ?Interval History: ?Denies any significant change since her visit with me.  Has had some discussions with her brother about treatment options. ? ?Past Medical/Surgical History: ?Past Medical History:  ?Diagnosis Date  ? Anemia   ? CHF (congestive heart failure) ([facility])   ? Chronic fatigue   ? Chronic pain   ? Fibroids   ? HTN (hypertension)   ? Hypothyroidism   ? Migraine   ? Mononucleosis   ? ? ?Past Surgical History:  ?Procedure Laterality Date  ? TONSILLECTOMY    ? ? ?Family History  ?Problem Relation  Age of Onset  ? Heart disease Mother   ? Blindness Mother   ? Heart disease Father   ? Prostate cancer Father   ? Stomach cancer Paternal Aunt   ? Endometrial cancer Neg Hx   ? Ovarian cancer Neg Hx   ? Pancreatic cancer Neg Hx   ? Colon cancer Neg Hx   ? Breast cancer Neg Hx   ? ? ?Social History   ? ?Socioeconomic History  ? Marital status: Single  ?  Spouse name: Not on file  ? Number of children: Not on file  ? Years of education: Not on file  ? Highest education level: Not on file  ?Occupational History  ? Not on file  ?Tobacco Use  ? Smoking status: Never  ? Smokeless tobacco: Never  ?Vaping Use  ? Vaping Use: Never used  ?Substance and Sexual Activity  ? Alcohol use: Never  ? Drug use: Never  ? Sexual activity: Not on file  ?Other Topics Concern  ? Not on file  ?Social History Narrative  ? Not on file  ? ?Social Determinants of Health  ? ?Financial Resource Strain: Not on file  ?Food Insecurity: Not on file  ?Transportation Needs: Not on file  ?Physical Activity: Not on file  ?Stress: Not on file  ?Social Connections: Not on file  ? ? ?Current Medications: ? ?Current Outpatient Medications:  ?  ALPRAZolam (XANAX) 1 MG tablet, Take 1-2 tablets 30 minutes prior to MRI, may repeat once as needed. Must have driver., Disp: 4 tablet, Rfl: 0 ?  ferrous sulfate 325 (65 FE) MG EC tablet, Take 1 tablet (325 mg total) by mouth daily with breakfast., Disp: 30 tablet, Rfl: 0 ?  furosemide (LASIX) 40 MG tablet, Take 1 tablet (40 mg total) by mouth daily., Disp: 30 tablet, Rfl: 0 ?  levothyroxine (SYNTHROID) 100 MCG tablet, Take 100 mcg by mouth every morning., Disp: , Rfl:  ?  losartan (COZAAR) 25 MG tablet, Take 2 tablets (50 mg total) by mouth daily., Disp: 60 tablet, Rfl: 0 ?  potassium chloride (KLOR-CON) 10 MEQ tablet, Take 20 mEq by mouth daily., Disp: , Rfl:  ? ?Physical Exam: ?There were no vitals taken for this visit. ?Deferred given limitations of phone visit. ? ?Laboratory & Radiologic Studies: ?None new ? ?Assessment & Plan: ?Venise Ellingwood is a 71 y.o. woman with low-grade endometrial adenocarcinoma. ? ?Patient's brother was taken at the patient's house when I called today.  They had a discussion yesterday and again today.  The patient read to me the note that she had written stating that she would  like to move forward with surgery if she is deemed at acceptable risk by her various medical providers.  I asked if she would be amenable to [place] picking a date far enough in the future as a tentative date for surgery while we work to get clearance from her various providers.  She voiced that she would be excepting of this.  I will ask Joylene John to reach out to her tomorrow to pick a date for surgery.  We will also have the office work on sending preoperative clearance to her other providers. ? ?I discussed the assessment and treatment plan with the patient. The patient was provided with an opportunity to ask questions and all were answered. The patient agreed with the plan and demonstrated an understanding of the instructions.  ? ?The patient was advised to call back or see an in-person evaluation if the symptoms worsen  or if the condition fails to improve as anticipated.  ? ?12 minutes of total time was spent for this patient encounter, including preparation, phone counseling with the patient and coordination of care, and documentation of the encounter. ? ? ?Jeral Pinch, MD  ?Division of Gynecologic Oncology  ?Department of Obstetrics and Gynecology  ?[facility]  ? ?

## 2022-01-25 DIAGNOSIS — E039 Hypothyroidism, unspecified: Secondary | ICD-10-CM | POA: Diagnosis not present

## 2022-01-25 DIAGNOSIS — Z741 Need for assistance with personal care: Secondary | ICD-10-CM | POA: Diagnosis not present

## 2022-01-25 DIAGNOSIS — I5031 Acute diastolic (congestive) heart failure: Secondary | ICD-10-CM | POA: Diagnosis not present

## 2022-01-25 DIAGNOSIS — M6281 Muscle weakness (generalized): Secondary | ICD-10-CM | POA: Diagnosis not present

## 2022-01-25 DIAGNOSIS — D509 Iron deficiency anemia, unspecified: Secondary | ICD-10-CM | POA: Diagnosis not present

## 2022-01-25 DIAGNOSIS — I10 Essential (primary) hypertension: Secondary | ICD-10-CM | POA: Diagnosis not present

## 2022-01-26 ENCOUNTER — Telehealth: Payer: Self-pay | Admitting: *Deleted

## 2022-01-26 NOTE — Telephone Encounter (Signed)
Spoke with pt's brother to offer him several surgery dates. He stated June 6,2023 would work best for them because he will be pt's transportation. He requested if they can get a later OR time. Informed him its not a guarantee but we would try. Also informed him that they will get a call from pre surgical to set up an appt. He verbalized agreement. Joylene John, NP notified.   ?

## 2022-01-30 ENCOUNTER — Telehealth: Payer: Self-pay | Admitting: *Deleted

## 2022-01-30 NOTE — Telephone Encounter (Signed)
Per Dr Berline Lopes fax records and surgical optimization form to the patient's PCP, cardiology and neurology offices  ?

## 2022-01-31 ENCOUNTER — Telehealth: Payer: Self-pay

## 2022-01-31 ENCOUNTER — Telehealth: Payer: Self-pay | Admitting: Neurology

## 2022-01-31 ENCOUNTER — Telehealth: Payer: Self-pay | Admitting: *Deleted

## 2022-01-31 NOTE — Telephone Encounter (Signed)
? ?  Pre-operative Risk Assessment  ?  ?Patient Name: Chauntay Paszkiewicz  ?DOB: [birth year] ?MRN: [id number]  ? ?  ? ?Request for Surgical Clearance   ? ?Procedure:  XI ROBOTIC ASSISTED TOTAL HYSTERECTOMY WITH BILATERAL SALPINGO OOPHORECTOMY, POSSIBLE LAPAROTOMY ? ?Date of Surgery:  Clearance 03/07/22                              ?   ?Surgeon:  Jeral Pinch, MD ?Surgeon's Group or Practice Name:  [facility] ?Phone number:   ?Fax number:  [phone] ?  ?Type of Clearance Requested:   ?- Medical  ?  ?Type of Anesthesia:  General  ?  ?Additional requests/questions:   ? ?Signed, ?Jacqulynn Cadet   ?01/31/2022, 8:35 AM  ? ?

## 2022-01-31 NOTE — Telephone Encounter (Signed)
Received gynecology oncology presurgical clearance form by Dr. Jeral Pinch,  ? ?Pelvic ultrasound [facility] on January 04, 2022 revealed a uterus measuring 12.3 x 6.2 x 7.4 cm with endometrial lining not well seen, mass measuring up to 7.2 cm, suspected to be mid body uterine fibroid versus abnormally thickened endometrium. ? ?Patient complains of having bleeding for several weeks, shortness of breath, patient would like to move forward with surgery, ? ?Patient is recommended surgical staging including total hysterectomy, bilateral salpingo-oophorectomy, and the lymph node assessment, minimally invasive using robotic approach. ? ?I saw patient on January 19, 2022 for acute onset of memory loss since February 2023, MoCA examination was 15/30, confirmed by her brother the history of memory loss.  Differentiation diagnosis includes central nervous system structural abnormality, versus degenerative disease, ? ?She does have vascular risk factor of aging, sedentary lifestyle, severe anemia, hypothyroidism, hypertension, ? ?I have ordered MRI of the brain with without contrast with Xanax as needed prior to MRI, patient complains of severe claustrophobia, refused to proceed with MRI ? ?I would suggest MRI of the brain with without contrast under sedation prior to elective surgery. ? ? ?

## 2022-01-31 NOTE — Telephone Encounter (Signed)
Spoke with the patient's son and scheduled a pre op appt with Joylene John APP on 5/8 at 1 pm ?

## 2022-01-31 NOTE — Telephone Encounter (Signed)
? ?  Primary Cardiologist: Janina Mayo, MD ? ?Chart reviewed as part of pre-operative protocol coverage. Given past medical history and time since last visit, based on ACC/AHA guidelines, Hartlyn Reigel would be at acceptable risk for the planned procedure without further cardiovascular testing.  ? ?Her RCRI is a class II risk, 0.9% risk of major cardiac event. ? ?I will route this recommendation to the requesting party via Epic fax function and remove from pre-op pool. ? ?Please call with questions. ? ?Jossie Ng. Suetta Hoffmeister NP-C ? ?  ?01/31/2022, 9:39 AM ?[place] ?[address] ?Office [phone] Fax [phone] ? ? ? ? ?

## 2022-02-01 ENCOUNTER — Encounter: Payer: Self-pay | Admitting: Gynecologic Oncology

## 2022-02-02 DIAGNOSIS — I10 Essential (primary) hypertension: Secondary | ICD-10-CM | POA: Diagnosis not present

## 2022-02-02 DIAGNOSIS — M6281 Muscle weakness (generalized): Secondary | ICD-10-CM | POA: Diagnosis not present

## 2022-02-02 DIAGNOSIS — D509 Iron deficiency anemia, unspecified: Secondary | ICD-10-CM | POA: Diagnosis not present

## 2022-02-02 DIAGNOSIS — E039 Hypothyroidism, unspecified: Secondary | ICD-10-CM | POA: Diagnosis not present

## 2022-02-02 DIAGNOSIS — Z741 Need for assistance with personal care: Secondary | ICD-10-CM | POA: Diagnosis not present

## 2022-02-02 DIAGNOSIS — I5031 Acute diastolic (congestive) heart failure: Secondary | ICD-10-CM | POA: Diagnosis not present

## 2022-02-06 ENCOUNTER — Inpatient Hospital Stay: Payer: Medicare Other | Attending: Gynecologic Oncology | Admitting: Gynecologic Oncology

## 2022-02-06 ENCOUNTER — Other Ambulatory Visit: Payer: Self-pay

## 2022-02-06 VITALS — BP 151/59 | HR 82 | Temp 97.8°F | Resp 16 | Ht 60.0 in | Wt 181.4 lb

## 2022-02-06 DIAGNOSIS — E039 Hypothyroidism, unspecified: Secondary | ICD-10-CM | POA: Diagnosis not present

## 2022-02-06 DIAGNOSIS — I10 Essential (primary) hypertension: Secondary | ICD-10-CM | POA: Diagnosis not present

## 2022-02-06 DIAGNOSIS — C541 Malignant neoplasm of endometrium: Secondary | ICD-10-CM

## 2022-02-06 DIAGNOSIS — M6281 Muscle weakness (generalized): Secondary | ICD-10-CM | POA: Diagnosis not present

## 2022-02-06 DIAGNOSIS — Z741 Need for assistance with personal care: Secondary | ICD-10-CM | POA: Diagnosis not present

## 2022-02-06 DIAGNOSIS — I5031 Acute diastolic (congestive) heart failure: Secondary | ICD-10-CM | POA: Diagnosis not present

## 2022-02-06 DIAGNOSIS — D509 Iron deficiency anemia, unspecified: Secondary | ICD-10-CM | POA: Diagnosis not present

## 2022-02-06 NOTE — Patient Instructions (Signed)
Preparing for your Surgery ? ?Plan for surgery on March 07, 2022 with Dr. Jeral Pinch at [facility] will be scheduled for robotic assisted total laparoscopic hysterectomy (removal of the uterus and cervix), bilateral salpingo-oophorectomy (removal of both ovaries and fallopian tubes), sentinel lymph node biopsy, possible lymph node dissection, possible laparotomy (larger incision on your abdomen if needed).  ? ?We are working on obtaining medical risk stratification/clearance from your medical providers. ? ?Pre-operative Testing ?-You will receive a phone call from presurgical testing at [facility] to arrange for a pre-operative appointment and lab work. ? ?-Bring your insurance card, copy of an advanced directive if applicable, medication list ? ?-At that visit, you will be asked to sign a consent for a possible blood transfusion in case a transfusion becomes necessary during surgery.  The need for a blood transfusion is rare but having consent is a necessary part of your care.    ? ?-You should not be taking blood thinners or aspirin at least ten days prior to surgery unless instructed by your surgeon. ? ?-Do not take supplements such as fish oil (omega 3), red yeast rice, turmeric before your surgery. You want to avoid medications with aspirin in them including headache powders such as BC or Goody's), Excedrin migraine. ? ?Day Before Surgery at Home ?-You will be asked to take in a light diet the day before surgery. You will be advised you can have clear liquids up until 3 hours before your surgery.   ? ?Eat a light diet the day before surgery.  Examples including soups, broths, toast, yogurt, mashed potatoes.  AVOID GAS PRODUCING FOODS. Things to avoid include carbonated beverages (fizzy beverages, sodas), raw fruits and raw vegetables (uncooked), or beans.  ? ?If your bowels are filled with gas, your surgeon will have difficulty visualizing your pelvic organs which increases your  surgical risks. ? ?Your role in recovery ?Your role is to become active as soon as directed by your doctor, while still giving yourself time to heal.  Rest when you feel tired. You will be asked to do the following in order to speed your recovery: ? ?- Cough and breathe deeply. This helps to clear and expand your lungs and can prevent pneumonia after surgery.  ?- STAY ACTIVE WHEN YOU GET HOME. Do mild physical activity. Walking or moving your legs help your circulation and body functions return to normal. Do not try to get up or walk alone the first time after surgery.   ?-If you develop swelling on one leg or the other, pain in the back of your leg, redness/warmth in one of your legs, please call the office or go to the Emergency Room to have a doppler to rule out a blood clot. For shortness of breath, chest pain-seek care in the Emergency Room as soon as possible. ?- Actively manage your pain. Managing your pain lets you move in comfort. We will ask you to rate your pain on a scale of zero to 10. It is your responsibility to tell your doctor or nurse where and how much you hurt so your pain can be treated. ? ?Special Considerations ?-If you are diabetic, you may be placed on insulin after surgery to have closer control over your blood sugars to promote healing and recovery.  This does not mean that you will be discharged on insulin.  If applicable, your oral antidiabetics will be resumed when you are tolerating a solid diet. ? ?-Your final pathology results from  surgery should be available around one week after surgery and the results will be relayed to you when available. ? ?-Dr. Lahoma Crocker is the surgeon that assists your GYN Oncologist with surgery.  If you end up staying the night, the next day after your surgery you will either see Dr. Berline Lopes or Dr. Lahoma Crocker. ? ?-FMLA forms can be faxed to [phone] and please allow 5-7 business days for completion. ? ?Pain Management After Surgery ?-You  will be prescribed your pain medication and bowel regimen medications for after surgery. ? ?-Make sure that you have Tylenol and Ibuprofen if you are able to take these medications at home to use on a regular basis after surgery for pain control. We recommend alternating the medications every hour to six hours since they work differently and are processed in the body differently for pain relief. ? ?-Review the attached handout on narcotic use and their risks and side effects.  ? ?Bowel Regimen ?-You will be prescribed Sennakot-S to take nightly to prevent constipation especially if you are taking the narcotic pain medication intermittently.  It is important to prevent constipation and drink adequate amounts of liquids. You can stop taking this medication when you are not taking pain medication and you are back on your normal bowel routine. ? ?Risks of Surgery ?Risks of surgery are low but include bleeding, infection, damage to surrounding structures, re-operation, blood clots, and very rarely death. ? ? ?Blood Transfusion Information (For the consent to be signed before surgery) ? ?We will be checking your blood type before surgery so in case of emergencies, we will know what type of blood you would need. ? ?                                          WHAT IS A BLOOD TRANSFUSION? ? ?A transfusion is the replacement of blood or some of its parts. Blood is made up of multiple cells which provide different functions. ?Red blood cells carry oxygen and are used for blood loss replacement. ?White blood cells fight against infection. ?Platelets control bleeding. ?Plasma helps clot blood. ?Other blood products are available for specialized needs, such as hemophilia or other clotting disorders. ?BEFORE THE TRANSFUSION  ?Who gives blood for transfusions?  ?You may be able to donate blood to be used at a later date on yourself (autologous donation). ?Relatives can be asked to donate blood. This is generally not any safer than if  you have received blood from a stranger. The same precautions are taken to ensure safety when a relative's blood is donated. ?Healthy volunteers who are fully evaluated to make sure their blood is safe. This is blood bank blood. ?Transfusion therapy is the safest it has ever been in the practice of medicine. Before blood is taken from a donor, a complete history is taken to make sure that person has no history of diseases nor engages in risky social behavior (examples are intravenous drug use or sexual activity with multiple partners). The donor's travel history is screened to minimize risk of transmitting infections, such as malaria. The donated blood is tested for signs of infectious diseases, such as HIV and hepatitis. The blood is then tested to be sure it is compatible with you in order to minimize the chance of a transfusion reaction. If you or a relative donates blood, this is often done in anticipation of surgery and  is not appropriate for emergency situations. It takes many days to process the donated blood. ?RISKS AND COMPLICATIONS ?Although transfusion therapy is very safe and saves many lives, the main dangers of transfusion include:  ?Getting an infectious disease. ?Developing a transfusion reaction. This is an allergic reaction to something in the blood you were given. Every precaution is taken to prevent this. ?The decision to have a blood transfusion has been considered carefully by your caregiver before blood is given. Blood is not given unless the benefits outweigh the risks. ? ?AFTER SURGERY INSTRUCTIONS ? ?Return to work: 4-6 weeks if applicable ? ?Activity: ?1. Be up and out of the bed during the day.  Take a nap if needed.  You may walk up steps but be careful and use the hand rail.  Stair climbing will tire you more than you think, you may need to stop part way and rest.  ? ?2. No lifting or straining for 6 weeks over 10 pounds. No pushing, pulling, straining for 6 weeks. ? ?3. No driving for  around 1 week(s).  Do not drive if you are taking narcotic pain medicine and make sure that your reaction time has returned.  ? ?4. You can shower as soon as the next day after surgery. Shower daily.  U

## 2022-02-06 NOTE — Progress Notes (Signed)
Patient here with her brother for a pre-operative appointment prior to her scheduled surgery on March 07, 2022. She is scheduled for robotic assisted total laparoscopic hysterectomy, bilateral salpingo-oophorectomy, sentinel lymph node biopsy, possible lymph node dissection, possible laparotomy.  She has her pre-admission testing appointment at [facility] on 02/22/22.  The surgery was discussed in detail.  See after visit summary for additional details. Visual aids used to discuss items related to surgery including the incentive spirometer, sequential compression stockings, foley catheter, IV pump, multi-modal pain regimen including tylenol, photo of the surgical robot, female reproductive system to discuss surgery in detail.    ?  ?Discussed post-op pain management in detail including the aspects of the enhanced recovery pathway.  Advised her that a new prescription would be sent in for tramadol closer to the surgery date and it is only to be used for after her upcoming surgery.  We discussed the use of tylenol post-op and to monitor for a maximum of 4,000 mg in a 24 hour period.  Also will plan to prescribe sennakot to be used after surgery and to hold if having loose stools.  Discussed bowel regimen in detail. Pt does not take aspirin products or NSAIDS. Pt is unsure whether she can take tylenol and will check on this. Information given about advanced directives and the clinics at the [facility].   ?  ?Discussed the use of SCDs and measures to take at home to prevent DVT including frequent mobility.  Reportable signs and symptoms of DVT discussed. Post-operative instructions discussed and expectations for after surgery. Incisional care discussed as well including reportable signs and symptoms including erythema, drainage, wound separation.  ?   ?15 minutes spent with the patient.  Verbalizing understanding of material discussed. No needs or concerns voiced at the end of the visit.   Advised patient and family to  call for any needs.   ? ?This appointment is included in the global surgical bundle as pre-operative teaching and has no charge.     ?

## 2022-02-13 DIAGNOSIS — H5213 Myopia, bilateral: Secondary | ICD-10-CM | POA: Diagnosis not present

## 2022-02-21 NOTE — Progress Notes (Addendum)
COVID Vaccine Completed: yes x1  Date of COVID positive in last 90 days: no  PCP - Myles Lipps, DO Cardiologist - Carolan Clines, MD  Cardiac clearance by Dorris Fetch 01/31/22 Epic Seeing PCP 5/30 for clearance  Chest x-ray - 11/18/21 Epic EKG - 01/10/22 Epic Stress Test - n/a ECHO - 11/19/21 Epic Cardiac Cath - n/a Pacemaker/ICD device last checked: n/a Spinal Cord Stimulator: n/a  Bowel Prep -  light diet day before  Sleep Study - yes, negative  CPAP -   Fasting Blood Sugar - n/a Checks Blood Sugar _____ times a day  Blood Thinner Instructions: n/a Aspirin Instructions: Last Dose:  Activity level: Can go up a flight of stairs and perform activities of daily living without stopping and without symptoms of chest pain or shortness of breath.    Anesthesia review: pericardial effusion, HTN, CAD  Patient denies shortness of breath, fever, cough and chest pain at PAT appointment   Patient verbalized understanding of instructions that were given to them at the PAT appointment. Patient was also instructed that they will need to review over the PAT instructions again at home before surgery.

## 2022-02-21 NOTE — Patient Instructions (Signed)
DUE TO COVID-19 ONLY TWO VISITORS  (aged 70 and older)  ARE ALLOWED TO COME WITH YOU AND STAY IN THE WAITING ROOM ONLY DURING PRE OP AND PROCEDURE.   **NO VISITORS ARE ALLOWED IN THE SHORT STAY AREA OR RECOVERY ROOM!!**   Your procedure is scheduled on: 03/07/22   Report to [facility] Main Entrance    Report to admitting at 7:45 AM   Call this number if you have problems the morning of surgery [phone]   Do not eat food :After Midnight.   After Midnight you may have the following liquids until 7:00 AM DAY OF SURGERY  Water Black Coffee (sugar ok, NO MILK/CREAM OR CREAMERS)  Tea (sugar ok, NO MILK/CREAM OR CREAMERS) regular and decaf                             Plain Jell-O (NO RED)                                           Fruit ices (not with fruit pulp, NO RED)                                     Popsicles (NO RED)                                                                  Juice: apple, WHITE grape, WHITE cranberry Sports drinks like Gatorade (NO RED) Clear broth(vegetable,chicken,beef)  FOLLOW BOWEL PREP AND ANY ADDITIONAL PRE OP INSTRUCTIONS YOU RECEIVED FROM YOUR SURGEON'S OFFICE!!!     Oral Hygiene is also important to reduce your risk of infection.                                    Remember - BRUSH YOUR TEETH THE MORNING OF SURGERY WITH YOUR REGULAR TOOTHPASTE   Take these medicines the morning of surgery with A SIP OF WATER: Levothyroxine                               You may not have any metal on your body including hair pins, jewelry, and body piercing             Do not wear make-up, lotions, powders, perfumes, or deodorant  Do not wear nail polish including gel and S&S, artificial/acrylic nails, or any other type of covering on natural nails including finger and toenails. If you have artificial nails, gel coating, etc. that needs to be removed by a nail salon please have this removed prior to surgery or surgery may need to be canceled/ delayed if  the surgeon/ anesthesia feels like they are unable to be safely monitored.   Do not shave  48 hours prior to surgery.    Do not bring valuables to the hospital. [place]  FOR VALUABLES.   Patients discharged on the day of surgery will not be allowed to drive home.  Someone NEEDS to stay with you for the first 24 hours after anesthesia.   Special Instructions: Bring a copy of your healthcare power of attorney and living will documents         the day of surgery if you haven't scanned them before.              Please read over the following fact sheets you were given: IF YOU HAVE QUESTIONS ABOUT YOUR PRE-OP INSTRUCTIONS PLEASE CALL [facility] - Preparing for Surgery Before surgery, you can play an important role.  Because skin is not sterile, your skin needs to be as free of germs as possible.  You can reduce the number of germs on your skin by washing with CHG (chlorahexidine gluconate) soap before surgery.  CHG is an antiseptic cleaner which kills germs and bonds with the skin to continue killing germs even after washing. Please DO NOT use if you have an allergy to CHG or antibacterial soaps.  If your skin becomes reddened/irritated stop using the CHG and inform your nurse when you arrive at Short Stay. Do not shave (including legs and underarms) for at least 48 hours prior to the first CHG shower.  You may shave your face/neck.  Please follow these instructions carefully:  1.  Shower with CHG Soap the night before surgery and the  morning of surgery.  2.  If you choose to wash your hair, wash your hair first as usual with your normal  shampoo.  3.  After you shampoo, rinse your hair and body thoroughly to remove the shampoo.                             4.  Use CHG as you would any other liquid soap.  You can apply chg directly to the skin and wash.  Gently with a scrungie or clean washcloth.  5.  Apply the CHG Soap to your body ONLY  FROM THE NECK DOWN.   Do   not use on face/ open                           Wound or open sores. Avoid contact with eyes, ears mouth and   genitals (private parts).                       Wash face,  Genitals (private parts) with your normal soap.             6.  Wash thoroughly, paying special attention to the area where your    surgery  will be performed.  7.  Thoroughly rinse your body with warm water from the neck down.  8.  DO NOT shower/wash with your normal soap after using and rinsing off the CHG Soap.                9.  Pat yourself dry with a clean towel.            10.  Wear clean pajamas.            11.  Place clean sheets on your bed the night of your first shower and do not  sleep with pets. Day of Surgery : Do not apply any lotions/deodorants the  morning of surgery.  Please wear clean clothes to the hospital/surgery center.  FAILURE TO FOLLOW THESE INSTRUCTIONS MAY RESULT IN THE CANCELLATION OF YOUR SURGERY  PATIENT SIGNATURE_________________________________  NURSE SIGNATURE__________________________________  ________________________________________________________________________  WHAT IS A BLOOD TRANSFUSION? Blood Transfusion Information  A transfusion is the replacement of blood or some of its parts. Blood is made up of multiple cells which provide different functions. Red blood cells carry oxygen and are used for blood loss replacement. White blood cells fight against infection. Platelets control bleeding. Plasma helps clot blood. Other blood products are available for specialized needs, such as hemophilia or other clotting disorders. BEFORE THE TRANSFUSION  Who gives blood for transfusions?  Healthy volunteers who are fully evaluated to make sure their blood is safe. This is blood bank blood. Transfusion therapy is the safest it has ever been in the practice of medicine. Before blood is taken from a donor, a complete history is taken to make sure that person has no  history of diseases nor engages in risky social behavior (examples are intravenous drug use or sexual activity with multiple partners). The donor's travel history is screened to minimize risk of transmitting infections, such as malaria. The donated blood is tested for signs of infectious diseases, such as HIV and hepatitis. The blood is then tested to be sure it is compatible with you in order to minimize the chance of a transfusion reaction. If you or a relative donates blood, this is often done in anticipation of surgery and is not appropriate for emergency situations. It takes many days to process the donated blood. RISKS AND COMPLICATIONS Although transfusion therapy is very safe and saves many lives, the main dangers of transfusion include:  Getting an infectious disease. Developing a transfusion reaction. This is an allergic reaction to something in the blood you were given. Every precaution is taken to prevent this. The decision to have a blood transfusion has been considered carefully by your caregiver before blood is given. Blood is not given unless the benefits outweigh the risks. AFTER THE TRANSFUSION Right after receiving a blood transfusion, you will usually feel much better and more energetic. This is especially true if your red blood cells have gotten low (anemic). The transfusion raises the level of the red blood cells which carry oxygen, and this usually causes an energy increase. The nurse administering the transfusion will monitor you carefully for complications. HOME CARE INSTRUCTIONS  No special instructions are needed after a transfusion. You may find your energy is better. Speak with your caregiver about any limitations on activity for underlying diseases you may have. SEEK MEDICAL CARE IF:  Your condition is not improving after your transfusion. You develop redness or irritation at the intravenous (IV) site. SEEK IMMEDIATE MEDICAL CARE IF:  Any of the following symptoms occur  over the next 12 hours: Shaking chills. You have a temperature by mouth above 102 F (38.9 C), not controlled by medicine. Chest, back, or muscle pain. People around you feel you are not acting correctly or are confused. Shortness of breath or difficulty breathing. Dizziness and fainting. You get a rash or develop hives. You have a decrease in urine output. Your urine turns a dark color or changes to pink, red, or brown. Any of the following symptoms occur over the next 10 days: You have a temperature by mouth above 102 F (38.9 C), not controlled by medicine. Shortness of breath. Weakness after normal activity. The white part of the eye turns yellow (jaundice). You have  a decrease in the amount of urine or are urinating less often. Your urine turns a dark color or changes to pink, red, or brown. Document Released: 09/15/2000 Document Revised: 12/11/2011 Document Reviewed: 05/04/2008 [facility] Patient Information 2014 [place], [place].  _______________________________________________________________________

## 2022-02-22 ENCOUNTER — Encounter (HOSPITAL_COMMUNITY): Payer: Self-pay

## 2022-02-22 ENCOUNTER — Encounter (HOSPITAL_COMMUNITY)
Admission: RE | Admit: 2022-02-22 | Discharge: 2022-02-22 | Disposition: A | Discharge status: Home / Self Care | Payer: Medicare Other | Source: Ambulatory Visit | Attending: Gynecologic Oncology | Admitting: Gynecologic Oncology

## 2022-02-22 VITALS — BP 150/68 | HR 79 | Temp 98.5°F | Resp 12 | Ht 60.0 in | Wt 181.4 lb

## 2022-02-22 DIAGNOSIS — Z01812 Encounter for preprocedural laboratory examination: Secondary | ICD-10-CM | POA: Insufficient documentation

## 2022-02-22 DIAGNOSIS — C541 Malignant neoplasm of endometrium: Secondary | ICD-10-CM

## 2022-02-22 DIAGNOSIS — Z01818 Encounter for other preprocedural examination: Secondary | ICD-10-CM

## 2022-02-22 LAB — COMPREHENSIVE METABOLIC PANEL
ALT: 19 U/L (ref 0–44)
AST: 27 U/L (ref 15–41)
Albumin: 3.6 g/dL (ref 3.5–5.0)
Alkaline Phosphatase: 69 U/L (ref 38–126)
Anion gap: 7 (ref 5–15)
BUN: 20 mg/dL (ref 8–23)
CO2: 26 mmol/L (ref 22–32)
Calcium: 10 mg/dL (ref 8.9–10.3)
Chloride: 106 mmol/L (ref 98–111)
Creatinine, Ser: 1.03 mg/dL — ABNORMAL HIGH (ref 0.44–1.00)
GFR, Estimated: 58 mL/min — ABNORMAL LOW (ref >60)
Glucose, Bld: 95 mg/dL (ref 70–99)
Potassium: 4 mmol/L (ref 3.5–5.1)
Sodium: 139 mmol/L (ref 135–145)
Total Bilirubin: 0.7 mg/dL (ref 0.3–1.2)
Total Protein: 7.1 g/dL (ref 6.5–8.1)

## 2022-02-22 LAB — CBC
HCT: 32.4 % — ABNORMAL LOW (ref 36.0–46.0)
Hemoglobin: 10.9 g/dL — ABNORMAL LOW (ref 12.0–15.0)
MCH: 30.4 pg (ref 26.0–34.0)
MCHC: 33.6 g/dL (ref 30.0–36.0)
MCV: 90.5 fL (ref 80.0–100.0)
Platelets: 180 10*3/uL (ref 150–400)
RBC: 3.58 MIL/uL — ABNORMAL LOW (ref 3.87–5.11)
RDW: 14.5 % (ref 11.5–15.5)
WBC: 6.7 10*3/uL (ref 4.0–10.5)
nRBC: 0 % (ref 0.0–0.2)

## 2022-02-23 NOTE — Progress Notes (Signed)
Type and screen + for antibodies. Results routed to Dr. Berline Lopes. Order for DOS placed

## 2022-02-28 ENCOUNTER — Telehealth: Payer: Self-pay

## 2022-02-28 ENCOUNTER — Telehealth: Payer: Self-pay | Admitting: Neurology

## 2022-02-28 DIAGNOSIS — D500 Iron deficiency anemia secondary to blood loss (chronic): Secondary | ICD-10-CM | POA: Diagnosis not present

## 2022-02-28 DIAGNOSIS — E039 Hypothyroidism, unspecified: Secondary | ICD-10-CM | POA: Diagnosis not present

## 2022-02-28 DIAGNOSIS — R413 Other amnesia: Secondary | ICD-10-CM | POA: Diagnosis not present

## 2022-02-28 DIAGNOSIS — I10 Essential (primary) hypertension: Secondary | ICD-10-CM | POA: Diagnosis not present

## 2022-02-28 DIAGNOSIS — I5032 Chronic diastolic (congestive) heart failure: Secondary | ICD-10-CM | POA: Diagnosis not present

## 2022-02-28 NOTE — Telephone Encounter (Signed)
Natale Milch from Dr Lulu Riding office called needing clearance for the pt's scheduled surgery. Pt is needing an MRI prior to clearance. Please call pt when available to be scheduled. Dr. Charisse March office [phone]

## 2022-02-28 NOTE — Telephone Encounter (Signed)
I called Cynthia Hill at Dr. Charisse March office.  Patient has endometrial cancer.  She is scheduled for surgery on June 6.  Dr. Krista Blue recommended MRI brain prior to surgery/ surgical clearance.  However, patient has refused MRI.  Cynthia Hill is unsure of how to proceed from here since they need neuro clearance prior to patient's surgery.  She would like a call back at [phone] to discuss after further consideration from Dr. Krista Blue.

## 2022-02-28 NOTE — Telephone Encounter (Signed)
Received call from Erasmo Downer, [place] at Dr. Rhea Belton office. She is going to speak with Dr. Krista Blue regarding surgical clearance and will follow up with our office.

## 2022-02-28 NOTE — Telephone Encounter (Signed)
Left message with Dr. Rhea Belton office to clarify whether the patient is cleared for surgery with Dr. Berline Lopes on 03/07/22.

## 2022-02-28 NOTE — Telephone Encounter (Signed)
Failed to reach Dr. Charisse March office by number listed [phone]  Patient refused to have MRI prior to surgery,  The initial and only neurology visit on January 19, 2022, both patient and her brother concerned about her acute onset of memory loss, worry about central nervous system structural abnormality  Echocardiogram ejection fraction 60 to 65%, no other significant abnormality,  I ordered ultrasound of carotid artery instead, if there is no large vessel disease, may proceed with surgery as planned, consider MRI of the brain with without contrast under sedation.

## 2022-02-28 NOTE — Telephone Encounter (Signed)
Lacy from Dr. Charisse March office left a voicemail with our office this morning at 11:01am.  She says she was speaking with somebody from my office but the call was disconnected.  She is asking for a return call at [phone].

## 2022-02-28 NOTE — Telephone Encounter (Signed)
I also attempted to call Dr. Charisse March office.  They are closed for the day.  I left a voicemail asking for Cynthia Hill to call us back tomorrow.

## 2022-03-01 NOTE — Telephone Encounter (Signed)
Dr Berline Lopes is in surgery today, Bella Kennedy provided [phone] for Dr Krista Blue to reach Dr Berline Lopes on

## 2022-03-01 NOTE — Telephone Encounter (Signed)
Following up with Dr. Rhea Belton office this morning regarding patients surgical clearance. Per Sharyn Lull at [facility] Dr. Krista Blue would like to speak with Dr. Berline Lopes directly regarding the patient. Dr. Krista Blue is currently in a procedure. Provided them with Dr. Lulu Riding number and advised them that she is in surgery today.

## 2022-03-02 ENCOUNTER — Telehealth: Payer: Self-pay | Admitting: *Deleted

## 2022-03-02 ENCOUNTER — Other Ambulatory Visit: Payer: Self-pay | Admitting: Gynecologic Oncology

## 2022-03-02 DIAGNOSIS — R413 Other amnesia: Secondary | ICD-10-CM

## 2022-03-02 DIAGNOSIS — C541 Malignant neoplasm of endometrium: Secondary | ICD-10-CM

## 2022-03-02 NOTE — Telephone Encounter (Signed)
I was able to talk with Dr. Berline Lopes, patient is on schedule for endometrial cancer surgery on March 07, 2022,  I saw her initially and  of the only time on January 19, 2022 with her brother, concern for acute onset of memory loss, examination 15/30  I am concerned about brain structural abnormality, as ordered MRI of the brain with without contrast, had discussion with patient multiple times, she refused to have MRI of the brain  Echocardiogram in February 2023 showed no significant abnormality, I also ordered ultrasound of carotid arteries to rule out large vessel disease for presurgical clearance,

## 2022-03-02 NOTE — Telephone Encounter (Signed)
Spoke with pt's brother Mikki Santee today to inform him that per Joylene John, NP we are still awaiting clearance from your neurologist.  Since the patient did not want to proceed with the MRI of the brain, the neurologist said she is recommending that pt have an ultrasound of the carotid arteries to make sure there is not an issue there. If this is negative she said she would feel ok with pt proceeding with surgery as planned. Dr.Tucker is also wanting to obtain a CT scan of the abdomen and pelvis to further evaluate the abdomen to make sure there's no evidence of any cancer spread that could also be contributing to her symptoms. Pt is scheduled for a ultra sound of the carotid at [facility] at 10am and a CT of the abdomen and pelvis at [facility] hospital at 3 pm. Pt instructed to arrive at 2:30pm with NPO 4 hours prior. Pt's brother stated she will not drink the contrast and opted for the IV contrast. He verbalized understanding of message and appointments.

## 2022-03-04 ENCOUNTER — Ambulatory Visit (HOSPITAL_COMMUNITY): Payer: Medicare Other

## 2022-03-06 ENCOUNTER — Telehealth: Payer: Self-pay

## 2022-03-06 ENCOUNTER — Ambulatory Visit (HOSPITAL_COMMUNITY): Payer: Medicare Other

## 2022-03-06 ENCOUNTER — Ambulatory Visit (HOSPITAL_COMMUNITY)
Admission: RE | Admit: 2022-03-06 | Discharge: 2022-03-06 | Disposition: A | Discharge status: Home / Self Care | Payer: Medicare Other | Source: Ambulatory Visit | Attending: Neurology | Admitting: Neurology

## 2022-03-06 ENCOUNTER — Ambulatory Visit (HOSPITAL_COMMUNITY)
Admission: RE | Admit: 2022-03-06 | Discharge: 2022-03-06 | Disposition: A | Discharge status: Home / Self Care | Payer: Medicare Other | Source: Ambulatory Visit | Attending: Gynecologic Oncology | Admitting: Gynecologic Oncology

## 2022-03-06 DIAGNOSIS — K6389 Other specified diseases of intestine: Secondary | ICD-10-CM | POA: Diagnosis not present

## 2022-03-06 DIAGNOSIS — C541 Malignant neoplasm of endometrium: Secondary | ICD-10-CM | POA: Diagnosis present

## 2022-03-06 DIAGNOSIS — N200 Calculus of kidney: Secondary | ICD-10-CM | POA: Diagnosis not present

## 2022-03-06 DIAGNOSIS — K7460 Unspecified cirrhosis of liver: Secondary | ICD-10-CM | POA: Diagnosis not present

## 2022-03-06 DIAGNOSIS — R413 Other amnesia: Secondary | ICD-10-CM

## 2022-03-06 MED ORDER — SODIUM CHLORIDE (PF) 0.9 % IJ SOLN
INTRAMUSCULAR | Status: AC
  Filled 2022-03-06: qty 50

## 2022-03-06 MED ORDER — IOHEXOL 300 MG/ML  SOLN
100.0000 mL | Freq: Once | INTRAMUSCULAR | Status: AC | PRN
Start: 2022-03-06 — End: 2022-03-06
  Administered 2022-03-06: 100 mL via INTRAVENOUS

## 2022-03-06 NOTE — Telephone Encounter (Signed)
Telephone call to check on pre-operative status.  I spoke to pt's brother, who handles of her affairs and appointments. He is compliant with pre-operative instructions.  Reinforced nothing to eat after midnight. Clear liquids until 0700. Patient to arrive at [facility].  No questions or concerns voiced.  Instructed to call for any needs.

## 2022-03-06 NOTE — Progress Notes (Signed)
Carotid artery duplex has been completed. Preliminary results can be found in CV Proc through chart review.   03/06/22 10:12 AM Cynthia Hill RVT

## 2022-03-07 ENCOUNTER — Other Ambulatory Visit: Payer: Self-pay

## 2022-03-07 ENCOUNTER — Ambulatory Visit (HOSPITAL_BASED_OUTPATIENT_CLINIC_OR_DEPARTMENT_OTHER): Payer: Medicare Other | Admitting: Anesthesiology

## 2022-03-07 ENCOUNTER — Ambulatory Visit (HOSPITAL_COMMUNITY): Payer: Medicare Other | Admitting: Physician Assistant

## 2022-03-07 ENCOUNTER — Ambulatory Visit (HOSPITAL_COMMUNITY)
Admission: RE | Admit: 2022-03-07 | Discharge: 2022-03-08 | Disposition: A | Discharge status: Home / Self Care | Payer: Medicare Other | Attending: Gynecologic Oncology | Admitting: Gynecologic Oncology

## 2022-03-07 ENCOUNTER — Encounter (HOSPITAL_COMMUNITY): Payer: Self-pay | Admitting: Gynecologic Oncology

## 2022-03-07 ENCOUNTER — Encounter (HOSPITAL_COMMUNITY)
Admission: RE | Disposition: A | Discharge status: Home / Self Care | Payer: Self-pay | Source: Home / Self Care | Attending: Gynecologic Oncology

## 2022-03-07 DIAGNOSIS — I110 Hypertensive heart disease with heart failure: Secondary | ICD-10-CM | POA: Insufficient documentation

## 2022-03-07 DIAGNOSIS — C563 Malignant neoplasm of bilateral ovaries: Secondary | ICD-10-CM | POA: Insufficient documentation

## 2022-03-07 DIAGNOSIS — S3141XA Laceration without foreign body of vagina and vulva, initial encounter: Secondary | ICD-10-CM

## 2022-03-07 DIAGNOSIS — N135 Crossing vessel and stricture of ureter without hydronephrosis: Secondary | ICD-10-CM | POA: Diagnosis not present

## 2022-03-07 DIAGNOSIS — E039 Hypothyroidism, unspecified: Secondary | ICD-10-CM | POA: Diagnosis not present

## 2022-03-07 DIAGNOSIS — I509 Heart failure, unspecified: Secondary | ICD-10-CM | POA: Diagnosis not present

## 2022-03-07 DIAGNOSIS — C541 Malignant neoplasm of endometrium: Secondary | ICD-10-CM | POA: Diagnosis present

## 2022-03-07 DIAGNOSIS — Z01818 Encounter for other preprocedural examination: Secondary | ICD-10-CM

## 2022-03-07 HISTORY — PX: CYSTOSCOPY: SHX5120

## 2022-03-07 HISTORY — PX: ROBOTIC ASSISTED TOTAL HYSTERECTOMY WITH BILATERAL SALPINGO OOPHERECTOMY: SHX6086

## 2022-03-07 LAB — BASIC METABOLIC PANEL
Anion gap: 8 (ref 5–15)
BUN: 24 mg/dL — ABNORMAL HIGH (ref 8–23)
CO2: 24 mmol/L (ref 22–32)
Calcium: 9.2 mg/dL (ref 8.9–10.3)
Chloride: 107 mmol/L (ref 98–111)
Creatinine, Ser: 1.29 mg/dL — ABNORMAL HIGH (ref 0.44–1.00)
GFR, Estimated: 45 mL/min — ABNORMAL LOW (ref >60)
Glucose, Bld: 177 mg/dL — ABNORMAL HIGH (ref 70–99)
Potassium: 3.8 mmol/L (ref 3.5–5.1)
Sodium: 139 mmol/L (ref 135–145)

## 2022-03-07 LAB — TYPE AND SCREEN
ABO/RH(D): O POS
Antibody Screen: POSITIVE
Unit division: 0
Unit division: 0

## 2022-03-07 LAB — CBC
HCT: 32.3 % — ABNORMAL LOW (ref 36.0–46.0)
Hemoglobin: 10.6 g/dL — ABNORMAL LOW (ref 12.0–15.0)
MCH: 29.9 pg (ref 26.0–34.0)
MCHC: 32.8 g/dL (ref 30.0–36.0)
MCV: 91.2 fL (ref 80.0–100.0)
Platelets: 123 10*3/uL — ABNORMAL LOW (ref 150–400)
RBC: 3.54 MIL/uL — ABNORMAL LOW (ref 3.87–5.11)
RDW: 14.2 % (ref 11.5–15.5)
WBC: 4.9 10*3/uL (ref 4.0–10.5)
nRBC: 0 % (ref 0.0–0.2)

## 2022-03-07 LAB — PROTIME-INR
INR: 1 (ref 0.8–1.2)
Prothrombin Time: 13.2 seconds (ref 11.4–15.2)

## 2022-03-07 LAB — BPAM RBC
Blood Product Expiration Date: 202306122359
Blood Product Expiration Date: 202306222359
Unit Type and Rh: 5100
Unit Type and Rh: 5100

## 2022-03-07 LAB — APTT: aPTT: 20 seconds — ABNORMAL LOW (ref 24–36)

## 2022-03-07 SURGERY — HYSTERECTOMY, TOTAL, ROBOT-ASSISTED, LAPAROSCOPIC, WITH BILATERAL SALPINGO-OOPHORECTOMY
Anesthesia: General

## 2022-03-07 MED ORDER — LIDOCAINE HCL (PF) 2 % IJ SOLN
INTRAMUSCULAR | Status: AC
  Filled 2022-03-07: qty 25

## 2022-03-07 MED ORDER — OXYCODONE HCL 5 MG PO TABS: 5.0000 mg | ORAL_TABLET | ORAL | Status: DC | PRN

## 2022-03-07 MED ORDER — PHENYLEPHRINE 80 MCG/ML (10ML) SYRINGE FOR IV PUSH (FOR BLOOD PRESSURE SUPPORT)
PREFILLED_SYRINGE | INTRAVENOUS | Status: AC
  Filled 2022-03-07: qty 10

## 2022-03-07 MED ORDER — ROCURONIUM BROMIDE 10 MG/ML (PF) SYRINGE
PREFILLED_SYRINGE | INTRAVENOUS | Status: AC
  Filled 2022-03-07: qty 10

## 2022-03-07 MED ORDER — PHENYLEPHRINE HCL (PRESSORS) 10 MG/ML IV SOLN
INTRAVENOUS | Status: AC
Start: 2022-03-07 — End: ?
  Filled 2022-03-07: qty 1

## 2022-03-07 MED ORDER — PHENYLEPHRINE 80 MCG/ML (10ML) SYRINGE FOR IV PUSH (FOR BLOOD PRESSURE SUPPORT)
PREFILLED_SYRINGE | INTRAVENOUS | Status: DC | PRN
  Administered 2022-03-07 (×3): 80 ug via INTRAVENOUS

## 2022-03-07 MED ORDER — BUPIVACAINE HCL 0.25 % IJ SOLN
INTRAMUSCULAR | Status: AC
Start: 2022-03-07 — End: ?
  Filled 2022-03-07: qty 1

## 2022-03-07 MED ORDER — KCL IN DEXTROSE-NACL 20-5-0.45 MEQ/L-%-% IV SOLN
INTRAVENOUS | Status: DC
  Filled 2022-03-07 (×2): qty 1000

## 2022-03-07 MED ORDER — CHLORHEXIDINE GLUCONATE 0.12 % MT SOLN
15.0000 mL | Freq: Once | OROMUCOSAL | Status: AC
  Administered 2022-03-07: 15 mL via OROMUCOSAL

## 2022-03-07 MED ORDER — SUGAMMADEX SODIUM 200 MG/2ML IV SOLN
INTRAVENOUS | Status: DC | PRN
  Administered 2022-03-07: 200 mg via INTRAVENOUS

## 2022-03-07 MED ORDER — LEVOTHYROXINE SODIUM 100 MCG PO TABS
100.0000 ug | ORAL_TABLET | Freq: Every day | ORAL | Status: DC
  Administered 2022-03-08: 100 ug via ORAL
  Filled 2022-03-07: qty 1

## 2022-03-07 MED ORDER — LIDOCAINE 2% (20 MG/ML) 5 ML SYRINGE
INTRAMUSCULAR | Status: DC | PRN
  Administered 2022-03-07: 60 mg via INTRAVENOUS

## 2022-03-07 MED ORDER — TRAMADOL HCL 50 MG PO TABS
100.0000 mg | ORAL_TABLET | Freq: Two times a day (BID) | ORAL | Status: DC | PRN
  Filled 2022-03-07: qty 2

## 2022-03-07 MED ORDER — FENTANYL CITRATE (PF) 250 MCG/5ML IJ SOLN
INTRAMUSCULAR | Status: AC
  Filled 2022-03-07: qty 5

## 2022-03-07 MED ORDER — LACTATED RINGERS IV SOLN: INTRAVENOUS | Status: DC

## 2022-03-07 MED ORDER — LACTATED RINGERS IV SOLN: INTRAVENOUS | Status: DC | PRN

## 2022-03-07 MED ORDER — LACTATED RINGERS IR SOLN
Status: DC | PRN
  Administered 2022-03-07: 1000 mL

## 2022-03-07 MED ORDER — STERILE WATER FOR INJECTION IJ SOLN
INTRAMUSCULAR | Status: DC | PRN
  Administered 2022-03-07: 10 mL

## 2022-03-07 MED ORDER — LIDOCAINE 2% (20 MG/ML) 5 ML SYRINGE
INTRAMUSCULAR | Status: DC | PRN
  Administered 2022-03-07: 1.5 mg/kg/h via INTRAVENOUS

## 2022-03-07 MED ORDER — ROCURONIUM BROMIDE 10 MG/ML (PF) SYRINGE
PREFILLED_SYRINGE | INTRAVENOUS | Status: DC | PRN
  Administered 2022-03-07: 5 mg via INTRAVENOUS
  Administered 2022-03-07: 60 mg via INTRAVENOUS
  Administered 2022-03-07 (×2): 10 mg via INTRAVENOUS

## 2022-03-07 MED ORDER — ACETAMINOPHEN 10 MG/ML IV SOLN
INTRAVENOUS | Status: AC
  Filled 2022-03-07: qty 100

## 2022-03-07 MED ORDER — ACETAMINOPHEN 10 MG/ML IV SOLN
1000.0000 mg | Freq: Once | INTRAVENOUS | Status: DC | PRN
  Administered 2022-03-07: 1000 mg via INTRAVENOUS

## 2022-03-07 MED ORDER — ONDANSETRON HCL 4 MG/2ML IJ SOLN: 4.0000 mg | Freq: Four times a day (QID) | INTRAMUSCULAR | Status: DC | PRN

## 2022-03-07 MED ORDER — HEMOSTATIC AGENTS (NO CHARGE) OPTIME
TOPICAL | Status: DC | PRN
  Administered 2022-03-07: 1 via TOPICAL

## 2022-03-07 MED ORDER — FENTANYL CITRATE PF 50 MCG/ML IJ SOSY
PREFILLED_SYRINGE | INTRAMUSCULAR | Status: AC
  Filled 2022-03-07: qty 1

## 2022-03-07 MED ORDER — STERILE WATER FOR IRRIGATION IR SOLN
Status: DC | PRN
  Administered 2022-03-07: 1000 mL

## 2022-03-07 MED ORDER — PROPOFOL 10 MG/ML IV BOLUS
INTRAVENOUS | Status: AC
  Filled 2022-03-07: qty 20

## 2022-03-07 MED ORDER — STERILE WATER FOR INJECTION IJ SOLN
INTRAMUSCULAR | Status: DC | PRN
  Administered 2022-03-07: 4 mL via INTRAMUSCULAR

## 2022-03-07 MED ORDER — MIDAZOLAM HCL 2 MG/2ML IJ SOLN
INTRAMUSCULAR | Status: DC | PRN
  Administered 2022-03-07 (×2): 1 mg via INTRAVENOUS

## 2022-03-07 MED ORDER — ONDANSETRON HCL 4 MG/2ML IJ SOLN
INTRAMUSCULAR | Status: DC | PRN
  Administered 2022-03-07: 4 mg via INTRAVENOUS

## 2022-03-07 MED ORDER — FENTANYL CITRATE PF 50 MCG/ML IJ SOSY
25.0000 ug | PREFILLED_SYRINGE | INTRAMUSCULAR | Status: DC | PRN
  Administered 2022-03-07: 25 ug via INTRAVENOUS

## 2022-03-07 MED ORDER — DEXAMETHASONE SODIUM PHOSPHATE 10 MG/ML IJ SOLN
INTRAMUSCULAR | Status: AC
  Filled 2022-03-07: qty 1

## 2022-03-07 MED ORDER — DEXAMETHASONE SODIUM PHOSPHATE 10 MG/ML IJ SOLN
INTRAMUSCULAR | Status: DC | PRN
  Administered 2022-03-07: 10 mg via INTRAVENOUS

## 2022-03-07 MED ORDER — STERILE WATER FOR INJECTION IJ SOLN
INTRAMUSCULAR | Status: AC
  Filled 2022-03-07: qty 10

## 2022-03-07 MED ORDER — ENOXAPARIN SODIUM 40 MG/0.4ML IJ SOSY
40.0000 mg | PREFILLED_SYRINGE | INTRAMUSCULAR | Status: DC
  Administered 2022-03-08: 40 mg via SUBCUTANEOUS
  Filled 2022-03-07: qty 0.4

## 2022-03-07 MED ORDER — PROPOFOL 10 MG/ML IV BOLUS
INTRAVENOUS | Status: DC | PRN
  Administered 2022-03-07: 120 mg via INTRAVENOUS

## 2022-03-07 MED ORDER — HYDROMORPHONE HCL 1 MG/ML IJ SOLN: 0.5000 mg | INTRAMUSCULAR | Status: DC | PRN

## 2022-03-07 MED ORDER — SENNOSIDES-DOCUSATE SODIUM 8.6-50 MG PO TABS
2.0000 | ORAL_TABLET | Freq: Every day | ORAL | Status: DC
  Administered 2022-03-07: 2 via ORAL
  Filled 2022-03-07: qty 2

## 2022-03-07 MED ORDER — HEPARIN SODIUM (PORCINE) 5000 UNIT/ML IJ SOLN
5000.0000 [IU] | INTRAMUSCULAR | Status: AC
  Administered 2022-03-07: 5000 [IU] via SUBCUTANEOUS
  Filled 2022-03-07: qty 1

## 2022-03-07 MED ORDER — DEXAMETHASONE SODIUM PHOSPHATE 4 MG/ML IJ SOLN: 4.0000 mg | INTRAMUSCULAR | Status: DC

## 2022-03-07 MED ORDER — ORAL CARE MOUTH RINSE: 15.0000 mL | Freq: Once | OROMUCOSAL | Status: AC

## 2022-03-07 MED ORDER — MIDAZOLAM HCL 2 MG/2ML IJ SOLN
INTRAMUSCULAR | Status: AC
  Filled 2022-03-07: qty 2

## 2022-03-07 MED ORDER — CEFAZOLIN SODIUM-DEXTROSE 2-4 GM/100ML-% IV SOLN
2.0000 g | INTRAVENOUS | Status: AC
  Administered 2022-03-07: 2 g via INTRAVENOUS
  Filled 2022-03-07: qty 100

## 2022-03-07 MED ORDER — ACETAMINOPHEN 500 MG PO TABS
500.0000 mg | ORAL_TABLET | ORAL | Status: DC
  Filled 2022-03-07: qty 1

## 2022-03-07 MED ORDER — ACETAMINOPHEN 500 MG PO TABS
1000.0000 mg | ORAL_TABLET | Freq: Two times a day (BID) | ORAL | Status: DC
  Administered 2022-03-08 (×2): 1000 mg via ORAL
  Filled 2022-03-07 (×3): qty 2

## 2022-03-07 MED ORDER — ONDANSETRON HCL 4 MG PO TABS: 4.0000 mg | ORAL_TABLET | Freq: Four times a day (QID) | ORAL | Status: DC | PRN

## 2022-03-07 MED ORDER — BUPIVACAINE HCL 0.25 % IJ SOLN
INTRAMUSCULAR | Status: DC | PRN
  Administered 2022-03-07: 30 mL

## 2022-03-07 MED ORDER — FENTANYL CITRATE (PF) 250 MCG/5ML IJ SOLN
INTRAMUSCULAR | Status: DC | PRN
  Administered 2022-03-07: 50 ug via INTRAVENOUS
  Administered 2022-03-07: 100 ug via INTRAVENOUS
  Administered 2022-03-07 (×2): 50 ug via INTRAVENOUS

## 2022-03-07 MED ORDER — ONDANSETRON HCL 4 MG/2ML IJ SOLN
INTRAMUSCULAR | Status: AC
  Filled 2022-03-07: qty 2

## 2022-03-07 SURGICAL SUPPLY — 81 items
APPLICATOR ARISTA FLEXITIP XL (MISCELLANEOUS) ×1 IMPLANT
APPLICATOR SURGIFLO ENDO (HEMOSTASIS) ×1 IMPLANT
BACTOSHIELD CHG 4% 4OZ (MISCELLANEOUS) ×1
BAG LAPAROSCOPIC 12 15 PORT 16 (BASKET) IMPLANT
BAG RETRIEVAL 10 (BASKET)
BAG RETRIEVAL 12/15 (BASKET) ×3
BLADE EXTENDED COATED 6.5IN (ELECTRODE) ×1 IMPLANT
BLADE SURG SZ10 CARB STEEL (BLADE) IMPLANT
COVER BACK TABLE 60X90IN (DRAPES) ×3 IMPLANT
COVER TIP SHEARS 8 DVNC (MISCELLANEOUS) ×2 IMPLANT
COVER TIP SHEARS 8MM DA VINCI (MISCELLANEOUS) ×1
DERMABOND ADVANCED (GAUZE/BANDAGES/DRESSINGS) ×1
DERMABOND ADVANCED .7 DNX12 (GAUZE/BANDAGES/DRESSINGS) ×2 IMPLANT
DRAPE ARM DVNC X/XI (DISPOSABLE) ×8 IMPLANT
DRAPE COLUMN DVNC XI (DISPOSABLE) ×2 IMPLANT
DRAPE DA VINCI XI ARM (DISPOSABLE) ×4
DRAPE DA VINCI XI COLUMN (DISPOSABLE) ×1
DRAPE SHEET LG 3/4 BI-LAMINATE (DRAPES) ×3 IMPLANT
DRAPE SURG IRRIG POUCH 19X23 (DRAPES) ×3 IMPLANT
DRSG OPSITE POSTOP 4X6 (GAUZE/BANDAGES/DRESSINGS) IMPLANT
DRSG OPSITE POSTOP 4X8 (GAUZE/BANDAGES/DRESSINGS) IMPLANT
ELECT PENCIL ROCKER SW 15FT (MISCELLANEOUS) IMPLANT
ELECT REM PT RETURN 15FT ADLT (MISCELLANEOUS) ×3 IMPLANT
GAUZE 4X4 16PLY ~~LOC~~+RFID DBL (SPONGE) ×4 IMPLANT
GLOVE BIO SURGEON STRL SZ 6 (GLOVE) ×12 IMPLANT
GLOVE BIO SURGEON STRL SZ 6.5 (GLOVE) ×6 IMPLANT
GOWN STRL REUS W/ TWL LRG LVL3 (GOWN DISPOSABLE) ×8 IMPLANT
GOWN STRL REUS W/TWL LRG LVL3 (GOWN DISPOSABLE) ×4
HEMOSTAT ARISTA ABSORB 3G PWDR (HEMOSTASIS) ×1 IMPLANT
HOLDER FOLEY CATH W/STRAP (MISCELLANEOUS) IMPLANT
IRRIG SUCT STRYKERFLOW 2 WTIP (MISCELLANEOUS) ×3
IRRIGATION SUCT STRKRFLW 2 WTP (MISCELLANEOUS) ×2 IMPLANT
KIT PROCEDURE DA VINCI SI (MISCELLANEOUS) ×1
KIT PROCEDURE DVNC SI (MISCELLANEOUS) IMPLANT
KIT TURNOVER KIT A (KITS) IMPLANT
LIGASURE IMPACT 36 18CM CVD LR (INSTRUMENTS) IMPLANT
MANIPULATOR ADVINCU DEL 3.0 PL (MISCELLANEOUS) IMPLANT
MANIPULATOR ADVINCU DEL 3.5 PL (MISCELLANEOUS) IMPLANT
MANIPULATOR UTERINE 4.5 ZUMI (MISCELLANEOUS) ×1 IMPLANT
NDL HYPO 21X1.5 SAFETY (NEEDLE) ×2 IMPLANT
NDL SPNL 18GX3.5 QUINCKE PK (NEEDLE) IMPLANT
NEEDLE HYPO 21X1.5 SAFETY (NEEDLE) ×3 IMPLANT
NEEDLE SPNL 18GX3.5 QUINCKE PK (NEEDLE) ×3 IMPLANT
OBTURATOR OPTICAL STANDARD 8MM (TROCAR) ×1
OBTURATOR OPTICAL STND 8 DVNC (TROCAR) ×2
OBTURATOR OPTICALSTD 8 DVNC (TROCAR) ×2 IMPLANT
PACK ROBOT GYN CUSTOM WL (TRAY / TRAY PROCEDURE) ×3 IMPLANT
PACKING VAGINAL (PACKING) ×1 IMPLANT
PAD POSITIONING PINK XL (MISCELLANEOUS) ×3 IMPLANT
PENCIL BUTTON HOLSTER BLD 10FT (ELECTRODE) ×1 IMPLANT
PORT ACCESS TROCAR AIRSEAL 12 (TROCAR) ×2 IMPLANT
PORT ACCESS TROCAR AIRSEAL 5M (TROCAR) ×1
RETRACTOR LAPSCP 12X46 CVD (ENDOMECHANICALS) IMPLANT
RTRCTR LAPSCP 12X46 CVD (ENDOMECHANICALS) ×3
SCRUB CHG 4% DYNA-HEX 4OZ (MISCELLANEOUS) ×2 IMPLANT
SEAL CANN UNIV 5-8 DVNC XI (MISCELLANEOUS) ×8 IMPLANT
SEAL XI 5MM-8MM UNIVERSAL (MISCELLANEOUS) ×4
SET TRI-LUMEN FLTR TB AIRSEAL (TUBING) ×3 IMPLANT
SPIKE FLUID TRANSFER (MISCELLANEOUS) ×3 IMPLANT
SURGIFLO W/THROMBIN 8M KIT (HEMOSTASIS) ×1 IMPLANT
SUT MNCRL AB 4-0 PS2 18 (SUTURE) IMPLANT
SUT PDS AB 1 TP1 96 (SUTURE) IMPLANT
SUT VIC AB 0 CT1 27 (SUTURE) ×1
SUT VIC AB 0 CT1 27XBRD ANTBC (SUTURE) IMPLANT
SUT VIC AB 2-0 CT1 27 (SUTURE)
SUT VIC AB 2-0 CT1 TAPERPNT 27 (SUTURE) IMPLANT
SUT VIC AB 2-0 SH 27 (SUTURE) ×4
SUT VIC AB 2-0 SH 27X BRD (SUTURE) IMPLANT
SUT VIC AB 4-0 PS2 18 (SUTURE) ×6 IMPLANT
SYR 10ML LL (SYRINGE) IMPLANT
SYS BAG RETRIEVAL 10MM (BASKET)
SYS WOUND ALEXIS 18CM MED (MISCELLANEOUS)
SYSTEM BAG RETRIEVAL 10MM (BASKET) IMPLANT
SYSTEM WOUND ALEXIS 18CM MED (MISCELLANEOUS) IMPLANT
TOWEL OR NON WOVEN STRL DISP B (DISPOSABLE) IMPLANT
TRAP SPECIMEN MUCUS 40CC (MISCELLANEOUS) IMPLANT
TRAY FOLEY MTR SLVR 16FR STAT (SET/KITS/TRAYS/PACK) ×3 IMPLANT
TROCAR Z-THREAD FIOS 5X100MM (TROCAR) IMPLANT
UNDERPAD 30X36 HEAVY ABSORB (UNDERPADS AND DIAPERS) ×6 IMPLANT
WATER STERILE IRR 1000ML POUR (IV SOLUTION) ×3 IMPLANT
YANKAUER SUCT BULB TIP 10FT TU (MISCELLANEOUS) IMPLANT

## 2022-03-07 NOTE — Transfer of Care (Signed)
Immediate Anesthesia Transfer of Care Note  Patient: Cynthia Hill  Procedure(s) Performed: XI ROBOTIC ASSISTED TOTAL HYSTERECTOMY WITH BILATERAL SALPINGO OOPHORECTOMY, VAGINAL LACERATION REPAIR (Bilateral) CYSTOSCOPY  Patient Location: PACU  Anesthesia Type:General  Level of Consciousness: drowsy  Airway & Oxygen Therapy: Patient Spontanous Breathing and Patient connected to face mask oxygen  Post-op Assessment: Report given to RN and Post -op Vital signs reviewed and stable  Post vital signs: Reviewed and stable  Last Vitals:  Vitals Value Taken Time  BP 176/73 03/07/22 1431  Temp    Pulse 72 03/07/22 1433  Resp 14 03/07/22 1433  SpO2 100 % 03/07/22 1433  Vitals shown include unvalidated device data.  Last Pain:  Vitals:   03/07/22 0759  TempSrc:   PainSc: 3       Patients Stated Pain Goal: 2 (96/29/52 8413)  Complications: No notable events documented.

## 2022-03-07 NOTE — H&P (Signed)
Gynecologic Oncology H&P  03/07/22  Reason for Visit: 03/07/22  Treatment History: The patient was admitted in March 2022 with progressive fatigue and dyspnea.  Hemoglobin at time of presentation was 5.3 and blood work indicated severe iron deficiency anemia.  At that time, the patient endorsed intermittent vaginal bleeding with passage of clots.  She also endorsed a history of uterine fibroids.  She declined pelvic exam at that time and GYN, who had been consulted, recommended outpatient follow-up.  Patient was transfused 2 units of packed red blood cells and received Feraheme during that admission.  Pelvic ultrasound exam performed on 12/15/20 shows a uterus measuring 11.3 x 5.5 x 5.5 cm with a rounded, echogenic structure in the central uterus measuring 6 x 5 x 7.2 cm, with note that this is difficult to differentiate between myometrial and endometrial lesion.  Neither ovary well visualized.    The patient was recently hospitalized again in February 2023.  She presented initially with shortness of breath and symptomatic anemia with her hemoglobin was noted to be 4.6.  She received 2 units of packed red blood cells as whereas Feraheme with a discharge hemoglobin of 8.2.  Admission also notable for CHF exacerbation and pericardial effusion.     Pelvic ultrasound exam at [facility] on 01/04/2022 reveals uterus measuring 12.3 x 6.2 x 7.4 cm with an endometrial lining not well seen.  Mass measuring up to 7.2 cm suspected to be mid body uterine fibroid versus abnormally thickened endometrium.  Ultrasound exam was transabdominal only.  Endometrial biopsy on 01/05/2022 shows well differentiated endometrial adenocarcinoma.   Today, the patient reports doing well.  She notes having a good appetite.  Has rare nausea, denies any emesis.  Has intermittent constipation, otherwise endorses normal bowel function.  Has some baseline urinary frequency and urgency, denies any bladder symptoms.  She endorses fatigue at times,  energy has improved since recent hospitalization.  She denies any pelvic pain or cramping.     After discharge, she had heavier bleeding for several weeks, her bleeding has become lighter and her brother describes it more as spotting now.  Is not able to lay flat, usually sleeps on her side, becomes short of breath if she is laying with her head down.  Denies chest pain at rest or with ambulation.   Patient comes in with her brother today.  She saw Dr. Katy Fitch yesterday for her blurry vision.  He is going to be contacting her primary care doctor to get an MRI to rule out stroke as the cause of her symptoms.   Patient has significant memory issues.  She is unable to tell me when her vaginal bleeding started.  She was living in [place] prior to moving here until 2 years ago.  She was living with her sister at that time.  She was had been vaginal bleeding for at least a year prior to moving to [place].  Both the patient and her brother describe years of putting pads on the seat in the car so that she does not bleed through onto the seat.   Interval History: Bleeding is now daily.  She underwent ultrasound of carotid arteries. Given stenosis between 1-39%, cleared for surgery from neurologist Dr. Krista Blue.  Past Medical/Surgical History: Past Medical History:  Diagnosis Date   Anemia    CHF (congestive heart failure) (HCC)    Chronic fatigue    Chronic pain    Fibroids    HTN (hypertension)    Hypothyroidism  Migraine    Mononucleosis     Past Surgical History:  Procedure Laterality Date   TONSILLECTOMY      Family History  Problem Relation Age of Onset   Heart disease Mother    Blindness Mother    Heart disease Father    Prostate cancer Father    Stomach cancer Paternal Aunt    Endometrial cancer Neg Hx    Ovarian cancer Neg Hx    Pancreatic cancer Neg Hx    Colon cancer Neg Hx    Breast cancer Neg Hx     Social History   Socioeconomic History   Marital status: Single     Spouse name: Not on file   Number of children: Not on file   Years of education: Not on file   Highest education level: Not on file  Occupational History   Not on file  Tobacco Use   Smoking status: Never   Smokeless tobacco: Never  Vaping Use   Vaping Use: Never used  Substance and Sexual Activity   Alcohol use: Never   Drug use: Never   Sexual activity: Not Currently  Other Topics Concern   Not on file  Social History Narrative   Not on file   Social Determinants of Health   Financial Resource Strain: Not on file  Food Insecurity: Not on file  Transportation Needs: Not on file  Physical Activity: Not on file  Stress: Not on file  Social Connections: Not on file    Current Medications:  Current Facility-Administered Medications:    acetaminophen (TYLENOL) tablet 500 mg, 500 mg, Oral, On Call to OR, Cross, Melissa D, NP   ceFAZolin (ANCEF) IVPB 2g/100 mL premix, 2 g, Intravenous, On Call to OR, Cross, Melissa D, NP   dexamethasone (DECADRON) injection 4 mg, 4 mg, Intravenous, On Call to OR, Joylene John D, NP   lactated ringers infusion, , Intravenous, Continuous, Hatchett, Franklin, MD, Last Rate: 10 mL/hr at 03/07/22 0820, New Bag at 03/07/22 0820  Review of Systems: + Blurred vision, vaginal bleeding Denies appetite changes, fevers, chills, fatigue, unexplained weight changes. Denies hearing loss, neck lumps or masses, mouth sores, ringing in ears or voice changes. Denies cough or wheezing.  Denies shortness of breath. Denies chest pain or palpitations. Denies leg swelling. Denies abdominal distention, pain, blood in stools, constipation, diarrhea, nausea, vomiting, or early satiety. Denies pain with intercourse, vaginal discharge.   Denies joint pain, back pain or muscle pain/cramps. Denies itching, rash, or wounds. Denies dizziness, headaches, numbness or seizures. Denies swollen lymph nodes or glands, denies easy bruising or bleeding. Denies anxiety,  depression, confusion, or decreased concentration.  Physical Exam: BP (!) 177/70   Pulse 93   Temp 98.6 F (37 C) (Oral)   Resp 14   Ht 5' (1.524 m)   Wt 181 lb 7 oz (82.3 kg)   SpO2 98%   BMI 35.43 kg/m  General: Alert, oriented, no acute distress.  HEENT: Normocephalic, atraumatic. Sclera anicteric.  Chest: Clear to auscultation bilaterally. No wheezes, rhonchi, or rales. Cardiovascular: Regular rate and rhythm, no murmurs, systolic murmur, no rubs.  Abdomen: Obese. Normoactive bowel sounds. Soft, mildly distended and tympanitic, nontender to palpation. No masses or hepatosplenomegaly appreciated. No palpable fluid wave.  Extremities: Grossly normal range of motion. Warm, well perfused. No edema bilaterally.  Skin: No rashes or lesions.   Laboratory & Radiologic Studies:    Latest Ref Rng & Units 02/22/2022    2:58 PM 11/24/2021  2:29 AM 11/23/2021    8:42 AM  CBC  WBC 4.0 - 10.5 K/uL 6.7   7.0   7.0    Hemoglobin 12.0 - 15.0 g/dL 10.9   8.2   8.2    Hematocrit 36.0 - 46.0 % 32.4   28.5   29.3    Platelets 150 - 400 K/uL 180   140   155        Latest Ref Rng & Units 02/22/2022    2:58 PM 11/24/2021    2:29 AM 11/23/2021    8:42 AM  BMP  Glucose 70 - 99 mg/dL 95   91   84    BUN 8 - 23 mg/dL '20   11   9    '$ Creatinine 0.44 - 1.00 mg/dL 1.03   1.06   1.01    Sodium 135 - 145 mmol/L 139   139   138    Potassium 3.5 - 5.1 mmol/L 4.0   3.7   3.5    Chloride 98 - 111 mmol/L 106   98   95    CO2 22 - 32 mmol/L 26   34   35    Calcium 8.9 - 10.3 mg/dL 10.0   9.2   9.1       Assessment & Plan: Avonna Iribe is a 71 y.o. woman with clinical stage I endometrial cancer.  Plan for definitive surgery today with TLH/Bso. Will plan for ICG injection and SLN biopsy if maps. Given comorbidities, I've spoken with the patient and her family. Plan to defer full lymph node assessment if she does not map in an effort to keep surgery and time under anesthesia short.  Reviewed CT  images with one of interventional radiologists. No obvious metastatic disease from uterine cancer but there is what looks to be a mass within the sigmoid colon concerning for possible cancer (and associated enlarged mesenteric lymph nodes). Discussed these findings with the patient. Plan to proceed with uterine surgery today as planned.  Jeral Pinch, MD  Division of Gynecologic Oncology  Department of Obstetrics and Gynecology  [facility]

## 2022-03-07 NOTE — Op Note (Signed)
OPERATIVE NOTE  Pre-operative Diagnosis: endometrial cancer grade 1, suspected colon cancer (mass seen in sigmoid colon on CT yesterday, mesenteric as well as retroperitoneal and thoracic adenopathy), significant retroperitoneal fibrosis  Post-operative Diagnosis: same  Operation: Robotic-assisted laparoscopic total hysterectomy with bilateral salpingo-oophorectomy, SLN injection bilaterally with no mapping to sentinel lymph node, cystoscopy, repair of multiple vaginal lacerations including deep right sidewall laceration and posterior laceration  Surgeon: Jeral Pinch MD  Assistant Surgeon: Lahoma Crocker MD (an MD assistant was necessary for tissue manipulation, management of robotic instrumentation, retraction and positioning due to the complexity of the case and hospital policies).   Anesthesia: GET  Urine Output: 250cc  Operative Findings: On EUA, narrow vagina; enlarged mobile uterus. On intra-abdominal entry, upper abdominal survey notable for cirrhotic appearing liver; normal stomach, small bowel and omentum. Sigmoid tethered to the left sidewall at the pelvic brim. Area that appeared somewhat firm with manipulation along the sigmoid colon (sitting out of the brim, to the right of midline) was noted. Uterus 10-12cm and very bulbous with multiple small subserosal fibroids. Bladder was moderately adherent to the cervix. Multi-cystic lesion on the left ovary. Some vesicular deposits on the left fallopian tube, concerning for metastatic disease versus endosalpingiosis. Otherwise, normal adnexa. Significant retroperitoneal fibrosis suspected to be cause of no ICG mapping in either pelvic basin. No obvious pelvic adenopathy. Given comorbidities and suspected varices (even in the pelvis, patient with significantly dilated vasculature and aberrant vasculature) as well as my prior discussion with the patient, decision made to defer pelvic lymph node assessment.  Cystoscopy revealed intact  dome of the bladder, good efflux from bilateral ureteral orifices. Large right vaginal sidewall laceration noted. Posterior laceration also noted. Both required repair and vagina packed to help assure hemostasis.   Estimated Blood Loss:  300cc      Total IV Fluids: see I&O flowsheet         Specimens: uterus, cervix, bilateral tubes and ovaries         Complications:  None apparent; patient tolerated the procedure well.         Disposition: PACU - hemodynamically stable.  Procedure Details  The patient was seen in the Holding Room. The risks, benefits, complications, treatment options, and expected outcomes were discussed with the patient.  The patient concurred with the proposed plan, giving informed consent.  The site of surgery properly noted/marked. The patient was identified as Cynthia Hill and the procedure verified as a Robotic-assisted hysterectomy with bilateral salpingo oophorectomy with SLN biopsy.   After induction of anesthesia, the patient was draped and prepped in the usual sterile manner. Patient was placed in supine position after anesthesia and draped and prepped in the usual sterile manner as follows: Her arms were tucked to her side with all appropriate precautions.  The shoulders were stabilized with padded shoulder blocks applied to the acromium processes.  The patient was placed in the semi-lithotomy position in [place].  The perineum and vagina were prepped with CholoraPrep. The patient was draped after the CholoraPrep had been allowed to dry for 3 minutes.  A Time Out was held and the above information confirmed.  The urethra was prepped with Betadine. Foley catheter was placed.  A sterile speculum was placed in the vagina.  The cervix was grasped with a single-tooth tenaculum. '2mg'$  total of ICG was injected into the cervical stroma at 2 and 9 o'clock with 1cc injected at a 1cm and 18m depth (concentration 0.'5mg'$ /ml) in all locations. The cervix was dilated  with  Kennon Rounds dilators.  The ZUMI uterine manipulator with a medium colpotomizer ring was placed without difficulty.  A pneum occluder balloon was placed over the manipulator.  OG tube placement was confirmed and to suction.   Next, a 10 mm skin incision was made 1 cm below the subcostal margin in the midclavicular line.  The 5 mm Optiview port and scope was used for direct entry.  Opening pressure was under 10 mm CO2.  The abdomen was insufflated and the findings were noted as above.   At this point and all points during the procedure, the patient's intra-abdominal pressure did not exceed 15 mmHg. Next, an 8 mm skin incision was made superior to the umbilicus and a right and left port were placed about 8 cm lateral to the robot port on the right and left side.  A fourth arm was placed on the right.  The 5 mm assist trocar was exchanged for a 10-12 mm port. All ports were placed under direct visualization.  The patient was placed in steep Trendelenburg.  Bowel was folded away into the upper abdomen.  The robot was docked in the normal manner.  The right and left peritoneum were opened parallel to the IP ligament to open the retroperitoneal spaces bilaterally. The round ligaments were transected. The SLN mapping was performed in bilateral pelvic basins. After identifying the ureters, the para rectal and paravesical spaces were opened with some difficulty given degree of retroperitoneal fibrosis. No mapping was noted in either pelvic basin.   The hysterectomy was started.  The ureter was again noted to be on the medial leaf of the broad ligament.  The peritoneum above the ureter was incised and stretched and the infundibulopelvic ligament was skeletonized, cauterized and cut. When this was done on the left, what was suspected to be significant atherosclerosis was noted with transection of the IP ligament (versus tumor).  The posterior peritoneum was taken down to the level of the KOH ring.  The anterior peritoneum was  also taken down.  The bladder flap was created to the level of the KOH ring.  The uterine artery on the right side was skeletonized, cauterized and cut in the normal manner.  A similar procedure was performed on the left.  The colpotomy was made and the uterus, cervix, bilateral ovaries and tubes were amputated, placed in an Endocatch bag, and delivered through the vagina.  Pedicles were inspected and excellent hemostasis was achieved.    The colpotomy at the vaginal cuff was closed with two 0 Vicryl on a CT1 needle in a running manner tied in the midline. Cystoscopy was performed at this time after 200 cc of sterile fluid was instilled in the bladder. Findings noted above.  Irrigation was used and excellent hemostasis was achieved.  Given oozing noted from peritoneal surfaces during surgery, Floseal was placed along the left pelvic sidewall and Arista was placed over the surgical bed in the deep pelvis. The intra-abdominal pressure was decreased to 5 mm of Hg with good hemostasis noted. At this point in the procedure was completed.    Robotic instruments were removed under direct visulaization.  The robot was undocked. The deep subcutaneous tissue at the 10-12 mm port was closed with 0 Vicryl on a UR-5 needle.  The subcuticular tissue was closed with 4-0 Vicryl and the skin was closed with 4-0 Monocryl in a subcuticular manner.  Dermabond was applied.    The foley catheter was replaced. The vagina was inspected with multiple  lacerations noted. The right sidewall laceration was repaired in running fashion with three 2-0 Vicryl sutures. The posterior vaginal laceration was made hemostatic with 2-0 Vicryl suture in running fashion.   The vagina was then packed with moist packing tape.  All sponge, lap and needle counts were correct x  3.   The patient was transferred to the recovery room in stable condition.  Jeral Pinch, MD

## 2022-03-07 NOTE — Discharge Instructions (Signed)
AFTER SURGERY INSTRUCTIONS   Return to work: 4-6 weeks if applicable   Activity: 1. Be up and out of the bed during the day.  Take a nap if needed.  You may walk up steps but be careful and use the hand rail.  Stair climbing will tire you more than you think, you may need to stop part way and rest.    2. No lifting or straining for 6 weeks over 10 pounds. No pushing, pulling, straining for 6 weeks.   3. No driving for around 1 week(s).  Do not drive if you are taking narcotic pain medicine and make sure that your reaction time has returned.    4. You can shower as soon as the next day after surgery. Shower daily.  Use your regular soap and water (not directly on the incision) and pat your incision(s) dry afterwards; don't rub.  No tub baths or submerging your body in water until cleared by your surgeon. If you have the soap that was given to you by pre-surgical testing that was used before surgery, you do not need to use it afterwards because this can irritate your incisions.    5. No sexual activity and nothing in the vagina for 8 weeks.   6. You may experience a small amount of clear drainage from your incisions, which is normal.  If the drainage persists, increases, or changes color please call the office.   7. Do not use creams, lotions, or ointments such as neosporin on your incisions after surgery until advised by your surgeon because they can cause removal of the dermabond glue on your incisions.     8. You may experience vaginal spotting after surgery or around the 6-8 week mark from surgery when the stitches at the top of the vagina begin to dissolve.  The spotting is normal but if you experience heavy bleeding, call our office.   9. Take Tylenol or ibuprofen first for pain and only use narcotic pain medication for severe pain not relieved by the Tylenol or Ibuprofen.  Monitor your Tylenol intake to a max of 4,000 mg in a 24 hour period. You can alternate these medications after  surgery.   Diet: 1. Low sodium Heart Healthy Diet is recommended but you are cleared to resume your normal (before surgery) diet after your procedure.   2. It is safe to use a laxative, such as Miralax or Colace, if you have difficulty moving your bowels. You have been prescribed Sennakot-S to take at bedtime every evening after surgery to keep bowel movements regular and to prevent constipation.     Wound Care: 1. Keep clean and dry.  Shower daily.   Reasons to call the Doctor: Fever - Oral temperature greater than 100.4 degrees Fahrenheit Foul-smelling vaginal discharge Difficulty urinating Nausea and vomiting Increased pain at the site of the incision that is unrelieved with pain medicine. Difficulty breathing with or without chest pain New calf pain especially if only on one side Sudden, continuing increased vaginal bleeding with or without clots.   Contacts: For questions or concerns you should contact:   Dr. Jeral Pinch at [phone]   Joylene John, NP at [phone]   After Hours: call [phone] and have the GYN Oncologist paged/contacted (after 5 pm or on the weekends).   Messages sent via mychart are for non-urgent matters and are not responded to after hours so for urgent needs, please call the after hours number.

## 2022-03-07 NOTE — Plan of Care (Signed)
  Problem: Education: Goal: Knowledge of General Education information will improve Description: Including pain rating scale, medication(s)/side effects and non-pharmacologic comfort measures Outcome: Progressing   Problem: Activity: Goal: Risk for activity intolerance will decrease Outcome: Progressing   Problem: Nutrition: Goal: Adequate nutrition will be maintained Outcome: Progressing   

## 2022-03-07 NOTE — Anesthesia Postprocedure Evaluation (Signed)
Anesthesia Post Note  Patient: Cynthia Hill  Procedure(s) Performed: XI ROBOTIC ASSISTED TOTAL HYSTERECTOMY WITH BILATERAL SALPINGO OOPHORECTOMY, VAGINAL LACERATION REPAIR (Bilateral) CYSTOSCOPY     Patient location during evaluation: PACU Anesthesia Type: General Level of consciousness: awake and alert Pain management: pain level controlled Vital Signs Assessment: post-procedure vital signs reviewed and stable Respiratory status: spontaneous breathing, nonlabored ventilation, respiratory function stable and patient connected to nasal cannula oxygen Cardiovascular status: blood pressure returned to baseline and stable Postop Assessment: no apparent nausea or vomiting Anesthetic complications: no   No notable events documented.  Last Vitals:  Vitals:   03/07/22 1643 03/07/22 1745  BP: (!) 163/75 (!) 154/74  Pulse: 71 68  Resp: 15 18  Temp:  36.7 C  SpO2: 100% 100%    Last Pain:  Vitals:   03/07/22 1745  TempSrc: Oral  PainSc:                  March Rummage Sigrid Schwebach

## 2022-03-07 NOTE — Anesthesia Procedure Notes (Signed)
Procedure Name: Intubation Date/Time: 03/07/2022 10:49 AM Performed by: Sharlette Dense, CRNA Pre-anesthesia Checklist: Patient identified, Emergency Drugs available, Suction available and Patient being monitored Patient Re-evaluated:Patient Re-evaluated prior to induction Oxygen Delivery Method: Circle system utilized Preoxygenation: Pre-oxygenation with 100% oxygen Induction Type: IV induction Ventilation: Mask ventilation without difficulty and Oral airway inserted - appropriate to patient size Laryngoscope Size: Mac and 4 Grade View: Grade II Tube type: Oral Tube size: 7.5 mm Number of attempts: 2 Airway Equipment and Method: Stylet Placement Confirmation: ETT inserted through vocal cords under direct vision, positive ETCO2 and breath sounds checked- equal and bilateral Secured at: 22 cm Tube secured with: Tape Dental Injury: Teeth and Oropharynx as per pre-operative assessment and Injury to lip  Comments: Intubation per EMT/Paramedic student, Wille Glaser

## 2022-03-07 NOTE — Brief Op Note (Signed)
03/07/2022  2:17 PM  PATIENT:  Cynthia Hill  71 y.o. female  PRE-OPERATIVE DIAGNOSIS:  ENDOMETRIAL CAANCER, suspected colon cancer  POST-OPERATIVE DIAGNOSIS:  ENDOMETRIAL CAANCER, suspected colon cancer, retroperitoneal fibrosis  PROCEDURE:  Procedure(s): XI ROBOTIC ASSISTED TOTAL HYSTERECTOMY WITH BILATERAL SALPINGO OOPHORECTOMY, VAGINAL LACERATION REPAIR (Bilateral) CYSTOSCOPY (N/A)  SURGEON:  Surgeon(s) and Role:    Lafonda Mosses, MD - Primary    * Lahoma Crocker, MD - Assisting   ANESTHESIA:   general  EBL:  300 mL   BLOOD ADMINISTERED:none  DRAINS: none   LOCAL MEDICATIONS USED:  MARCAINE     SPECIMEN:  uterus, cervix, tubes and ovaries  DISPOSITION OF SPECIMEN:  PATHOLOGY  COUNTS:  YES  TOURNIQUET:  * No tourniquets in log *  DICTATION: .Note written in EPIC  PLAN OF CARE: Admit for overnight observation  PATIENT DISPOSITION:  PACU - hemodynamically stable.   Delay start of Pharmacological VTE agent (>24hrs) due to surgical blood loss or risk of bleeding: no

## 2022-03-07 NOTE — Anesthesia Preprocedure Evaluation (Addendum)
Anesthesia Evaluation  Patient identified by MRN, date of birth, ID band Patient awake    Reviewed: Allergy & Precautions, NPO status , Patient's Chart, lab work & pertinent test results  Airway Mallampati: II  TM Distance: >3 FB Neck ROM: Full    Dental no notable dental hx.    Pulmonary neg pulmonary ROS,    Pulmonary exam normal        Cardiovascular hypertension, Pt. on medications +CHF   Rhythm:Regular Rate:Normal     Neuro/Psych  Headaches, negative psych ROS   GI/Hepatic negative GI ROS, Neg liver ROS,   Endo/Other  Hypothyroidism   Renal/GU negative Renal ROS  Female GU complaint Endometrial Ca    Musculoskeletal negative musculoskeletal ROS (+)   Abdominal Normal abdominal exam  (+)   Peds  Hematology  (+) Blood dyscrasia, anemia ,   Anesthesia Other Findings   Reproductive/Obstetrics                             Anesthesia Physical Anesthesia Plan  ASA: 2  Anesthesia Plan: General   Post-op Pain Management:    Induction: Intravenous  PONV Risk Score and Plan: 3 and Ondansetron and Dexamethasone  Airway Management Planned: Mask and Oral ETT  Additional Equipment: None  Intra-op Plan:   Post-operative Plan: Extubation in OR  Informed Consent: I have reviewed the patients History and Physical, chart, labs and discussed the procedure including the risks, benefits and alternatives for the proposed anesthesia with the patient or authorized representative who has indicated his/her understanding and acceptance.     Dental advisory given  Plan Discussed with: CRNA  Anesthesia Plan Comments: (Lab Results      Component                Value               Date                      WBC                      6.7                 02/22/2022                HGB                      10.9 (L)            02/22/2022                HCT                      32.4 (L)             02/22/2022                MCV                      90.5                02/22/2022                PLT                      180  02/22/2022           Lab Results      Component                Value               Date                      NA                       139                 02/22/2022                K                        4.0                 02/22/2022                CO2                      26                  02/22/2022                GLUCOSE                  95                  02/22/2022                BUN                      20                  02/22/2022                CREATININE               1.03 (H)            02/22/2022                CALCIUM                  10.0                02/22/2022                GFRNONAA                 58 (L)              02/22/2022          )        Anesthesia Quick Evaluation

## 2022-03-08 ENCOUNTER — Ambulatory Visit (HOSPITAL_BASED_OUTPATIENT_CLINIC_OR_DEPARTMENT_OTHER): Payer: Medicare Other

## 2022-03-08 ENCOUNTER — Encounter (HOSPITAL_COMMUNITY): Payer: Self-pay | Admitting: Gynecologic Oncology

## 2022-03-08 ENCOUNTER — Telehealth: Payer: Self-pay | Admitting: *Deleted

## 2022-03-08 DIAGNOSIS — E039 Hypothyroidism, unspecified: Secondary | ICD-10-CM | POA: Diagnosis not present

## 2022-03-08 DIAGNOSIS — I509 Heart failure, unspecified: Secondary | ICD-10-CM | POA: Diagnosis not present

## 2022-03-08 DIAGNOSIS — M79605 Pain in left leg: Secondary | ICD-10-CM

## 2022-03-08 DIAGNOSIS — I110 Hypertensive heart disease with heart failure: Secondary | ICD-10-CM | POA: Diagnosis not present

## 2022-03-08 DIAGNOSIS — C541 Malignant neoplasm of endometrium: Secondary | ICD-10-CM | POA: Diagnosis not present

## 2022-03-08 DIAGNOSIS — M79604 Pain in right leg: Secondary | ICD-10-CM

## 2022-03-08 DIAGNOSIS — C563 Malignant neoplasm of bilateral ovaries: Secondary | ICD-10-CM | POA: Diagnosis not present

## 2022-03-08 LAB — BASIC METABOLIC PANEL
Anion gap: 6 (ref 5–15)
Anion gap: 6 (ref 5–15)
BUN: 24 mg/dL — ABNORMAL HIGH (ref 8–23)
BUN: 25 mg/dL — ABNORMAL HIGH (ref 8–23)
CO2: 25 mmol/L (ref 22–32)
CO2: 26 mmol/L (ref 22–32)
Calcium: 9 mg/dL (ref 8.9–10.3)
Calcium: 9.2 mg/dL (ref 8.9–10.3)
Chloride: 105 mmol/L (ref 98–111)
Chloride: 105 mmol/L (ref 98–111)
Creatinine, Ser: 1.27 mg/dL — ABNORMAL HIGH (ref 0.44–1.00)
Creatinine, Ser: 1.21 mg/dL — ABNORMAL HIGH (ref 0.44–1.00)
GFR, Estimated: 45 mL/min — ABNORMAL LOW (ref >60)
GFR, Estimated: 48 mL/min — ABNORMAL LOW (ref >60)
Glucose, Bld: 147 mg/dL — ABNORMAL HIGH (ref 70–99)
Glucose, Bld: 153 mg/dL — ABNORMAL HIGH (ref 70–99)
Potassium: 4.4 mmol/L (ref 3.5–5.1)
Potassium: 4.3 mmol/L (ref 3.5–5.1)
Sodium: 136 mmol/L (ref 135–145)
Sodium: 137 mmol/L (ref 135–145)

## 2022-03-08 LAB — CBC
HCT: 27.6 % — ABNORMAL LOW (ref 36.0–46.0)
Hemoglobin: 9.4 g/dL — ABNORMAL LOW (ref 12.0–15.0)
MCH: 30 pg (ref 26.0–34.0)
MCHC: 34.1 g/dL (ref 30.0–36.0)
MCV: 88.2 fL (ref 80.0–100.0)
Platelets: 152 10*3/uL (ref 150–400)
RBC: 3.13 MIL/uL — ABNORMAL LOW (ref 3.87–5.11)
RDW: 13.7 % (ref 11.5–15.5)
WBC: 7.9 10*3/uL (ref 4.0–10.5)
nRBC: 0 % (ref 0.0–0.2)

## 2022-03-08 MED ORDER — SENNA 8.6 MG PO TABS: 1.0000 | ORAL_TABLET | Freq: Every day | ORAL | 0 refills | Status: AC

## 2022-03-08 MED ORDER — TRAMADOL HCL 50 MG PO TABS: 50.0000 mg | ORAL_TABLET | Freq: Four times a day (QID) | ORAL | 0 refills | Status: DC | PRN

## 2022-03-08 NOTE — Progress Notes (Signed)
Patient and brother was given discharge instructions, and all questions were answered. Patient was stable for discharge and was taken to the main exit by wheelchair.

## 2022-03-08 NOTE — Progress Notes (Signed)
1 Day Post-Op Procedure(s) (LRB): XI ROBOTIC ASSISTED TOTAL HYSTERECTOMY WITH BILATERAL SALPINGO OOPHORECTOMY, VAGINAL LACERATION REPAIR (Bilateral) CYSTOSCOPY (N/A)  Subjective: Patient reports feeling well this am. Continues to have intermittent difficulty finding words, present before surgery and follows with neurology. Loss of orientation of time intermittently with patient stating she feels she has been in the hospital several days. No nausea or emesis reported. Passing small amount flatus. Reports bilateral leg pain with light palpation. Denies dyspnea. Hungry this am and assisted with ordering breakfast. No concerns voiced.    Objective: Vital signs in last 24 hours: Temp:  [96.8 F (36 C)-98.5 F (36.9 C)] 98.3 F (36.8 C) (06/07 0528) Pulse Rate:  [67-78] 78 (06/07 0528) Resp:  [13-18] 18 (06/07 0528) BP: (154-182)/(63-86) 161/63 (06/07 0528) SpO2:  [95 %-100 %] 96 % (06/07 0528) Last BM Date : 03/05/22  Intake/Output from previous day: 06/06 0701 - 06/07 0700 In: 3670.4 [P.O.:820; I.V.:2750.4; IV Piggyback:100] Out: 2050 [Urine:1750; Blood:300]  Physical Examination: General: alert, cooperative, no distress, and intermittent word finding difficulty, loss of orientation of time intermittently Resp: Lungs clear except for expiratory wheezing Cardio: regular rate and rhythm, S1, S2 normal, no murmur, click, rub or gallop GI: soft, non-tender; bowel sounds normal; no masses,  no organomegaly and incision: lap sites to the abdomen with dermabond without erythema or drainage Extremities: no lower extrem edema noted bilaterally, tender with light palpation of both legs Vaginal Bleeding: vaginal packing removed by Dr. Berline Lopes. Packing stained without clots, no bright red blood  Labs: WBC/Hgb/Hct/Plts:  7.9/9.4/27.6/152 (06/07 0414) BUN/Cr/glu/ALT/AST/amyl/lip:  24/1.27/--/--/--/--/-- (06/07 0414)  Assessment: 71 y.o. s/p Procedure(s): XI ROBOTIC ASSISTED TOTAL HYSTERECTOMY WITH  BILATERAL SALPINGO OOPHORECTOMY, VAGINAL LACERATION REPAIR, CYSTOSCOPY: stable Pain:  Pain is well-controlled on PRN medications.  Heme: Hgb 9.4 and Hct 27.6 this am. Appropriate given surgical losses and preop values.   CV: BP and HR stable. Continue with ordered vital sign assessments.  GI:  Tolerating po: Yes. Antiemetics ordered as needed.  GU: Creatinine 1.27 this am. Adequate urine output reported. Plan for repeat bmet this am to follow up on creatinine.    FEN: No critical labs this am.   Prophylaxis: SCDs leg wraps in the room but no machine, lovenox ordered  Plan: Encourage IS  Encouraged ambulation with assist Foley to be removed, packing removed this am Repeat Bmet later today to follow up on creatinine Bilateral lower extrem doppler rule out DVT, high risk given 2 malignancies Continue plan of care per Dr. Berline Lopes Plan for possible discharge later today if meeting milestones, creatinine improving, doppler negative Will plan for outpatient referral to GI   LOS: 0 days    Myrtice Lowdermilk D Daryn Hicks 03/08/2022, 8:12 AM

## 2022-03-08 NOTE — Progress Notes (Signed)
  Transition of Care [facility]) Screening Note   Patient Details  Name: Cynthia Hill Date of Birth: [birth year]   Transition of Care [facility]) CM/SW Contact:    Lennart Pall, LCSW Phone Number: 03/08/2022, 10:26 AM    Transition of Care Department [facility]) has reviewed patient and no TOC needs have been identified at this time. We will continue to monitor patient advancement through interdisciplinary progression rounds. If new patient transition needs arise, please place a TOC consult.

## 2022-03-08 NOTE — Progress Notes (Signed)
Bilateral lower extremity venous duplex has been completed. Preliminary results can be found in CV Proc through chart review.   03/08/22 12:15 PM Cynthia Hill RVT

## 2022-03-08 NOTE — Progress Notes (Addendum)
GYN Oncology Progress Note  Patient sitting up in the chair. Alert, oriented, in no acute distress with improvement in word finding challenges experienced earlier. She is tolerating her diet with no nausea or emesis. Feels she is drinking adequate amounts. Reports passing small amount of flatus but reports feeling slightly bloated. She is voiding since foley removal, small amounts per pt. She reports vaginal bleeding, not a "huge amount." She would like to walk in the halls. She feels she will regress and not do well if she is not assisted with ambulating, increasing mobility. No other needs or concerns voiced. RN notified of patient's request to ambulate.

## 2022-03-08 NOTE — Telephone Encounter (Signed)
Spoke with [facility] who stated that Cynthia Hill is not a patient at their GI office. Cynthia John, NP notified.

## 2022-03-09 ENCOUNTER — Telehealth: Payer: Self-pay | Admitting: *Deleted

## 2022-03-09 ENCOUNTER — Other Ambulatory Visit: Payer: Self-pay | Admitting: Gynecologic Oncology

## 2022-03-09 DIAGNOSIS — K639 Disease of intestine, unspecified: Secondary | ICD-10-CM

## 2022-03-09 DIAGNOSIS — R591 Generalized enlarged lymph nodes: Secondary | ICD-10-CM

## 2022-03-09 LAB — TYPE AND SCREEN
ABO/RH(D): O POS
Antibody Screen: POSITIVE
Donor AG Type: NEGATIVE
Donor AG Type: NEGATIVE
Unit division: 0
Unit division: 0

## 2022-03-09 LAB — BPAM RBC
Blood Product Expiration Date: 202306122359
Blood Product Expiration Date: 202306222359
Unit Type and Rh: 5100
Unit Type and Rh: 5100

## 2022-03-09 NOTE — Progress Notes (Signed)
Urgent GI referral given recent CT imaging findings of focal sigmoid colonic wall and surrounding interstitial thickening, most consistent with carcinoma with probable transmural spread with abdominal and lower thoracic/retrocrural lymphadenopathy/metastasis.

## 2022-03-09 NOTE — Telephone Encounter (Signed)
Attempted to speak with pt today to follow up post discharge from the hospital. Unable to reach her or leave a message. Called her brother and LVM for Ms.Perazzo to call us back.

## 2022-03-09 NOTE — Telephone Encounter (Signed)
Attmepted to call pt and follow up with her post op. Unable to get a hold of her or her brother this afternoon. Unable to leave a message for pt. LVM on Robert's phone for a return call from him or from Ms.Barbar.

## 2022-03-09 NOTE — Telephone Encounter (Signed)
Spoke with pt's brother [name] today who stated that pt is doing well and recovering nicely. He was over visiting her when I called today. I asked him if he can get her to call me back so I can ask her specific questions about how her recovery is going. He stated when he goes to see her tomorrow he will get her to call me.

## 2022-03-09 NOTE — Telephone Encounter (Signed)
Spoke with pt's brother today who stated that Ms.Roam was doing good yesterday. Yesterday she rated her pain of a 3/10 and has not taken anything for pain. She endorses a little bit of soreness and I informed him that is to be expected. He also stated she is eating and drinking well. According to him she stated that she felt really good yesterday and wanted to sleep in her own bed so went to her own house last night because she wanted to sleep in today. He stated that we can call her and check in with her around 11am today. Reviewed post op instructions of No lifting or straining for 6 weeks over 10 pounds. No pushing, pulling, straining for 6 weeks. No sexual activity and nothing in the vagina for 8 weeks. No driving for around 1 week(s). Do not drive if you are taking narcotic pain medicine and make sure that your reaction time has returned. Pt may experience vaginal spotting after surgery or around the 6-8 week mark from surgery when the stitches at the top of the vagina begin to dissolve. The spotting is normal but if  it is heavy bleeding, call our office.A small amount of clear drainage from the incisions may happen, which is normal. If the drainage persists,increases, or changes color please call the office. Informed Robert we'll call Tanesha around 11am to check in on her. He verbalized understanding.

## 2022-03-10 ENCOUNTER — Telehealth: Payer: Self-pay

## 2022-03-10 NOTE — Telephone Encounter (Signed)
Spoke with Ms. Sword this afternoon. She states she is eating, drinking and urinating well. She has had a BM but is not passing gas.  She denies fever or chills. Incisions are dry and intact. She states she is not in any pain today. Her only complaint today is that her left leg feels like it is asleep but she knows it is not. Pt states her right leg feels normal and she is able to ambulate. Joylene John, NP is aware of pt complaint of leg. Pt instructed to call our office if she notices the feeling in her leg gets any worse or starts to have any pain. Pt verbalized understanding.   Instructed to call office with any fever, chills, purulent drainage, uncontrolled pain or any other questions or concerns. Patient verbalizes understanding.   Pt aware of post op appointments as well as the office number [phone] and after hours number [phone] to call if she has any questions or concerns

## 2022-03-13 ENCOUNTER — Telehealth: Payer: Self-pay | Admitting: *Deleted

## 2022-03-13 NOTE — Telephone Encounter (Signed)
Spoke with the patient's brother and gave the number to [facility] to call

## 2022-03-15 ENCOUNTER — Inpatient Hospital Stay: Payer: Medicare Other | Admitting: Gynecologic Oncology

## 2022-03-15 ENCOUNTER — Telehealth: Payer: Self-pay | Admitting: *Deleted

## 2022-03-15 NOTE — Telephone Encounter (Signed)
Per Dr Berline Lopes, reschedule phone visit from today to Friday due to pathology results not back

## 2022-03-16 LAB — SURGICAL PATHOLOGY

## 2022-03-17 ENCOUNTER — Encounter: Payer: Self-pay | Admitting: Internal Medicine

## 2022-03-17 ENCOUNTER — Inpatient Hospital Stay: Payer: Medicare Other | Attending: Gynecologic Oncology | Admitting: Gynecologic Oncology

## 2022-03-17 ENCOUNTER — Encounter: Payer: Self-pay | Admitting: Gynecologic Oncology

## 2022-03-17 ENCOUNTER — Telehealth: Payer: Self-pay

## 2022-03-17 DIAGNOSIS — Z90722 Acquired absence of ovaries, bilateral: Secondary | ICD-10-CM

## 2022-03-17 DIAGNOSIS — Z90710 Acquired absence of both cervix and uterus: Secondary | ICD-10-CM

## 2022-03-17 DIAGNOSIS — C541 Malignant neoplasm of endometrium: Secondary | ICD-10-CM

## 2022-03-17 DIAGNOSIS — R599 Enlarged lymph nodes, unspecified: Secondary | ICD-10-CM

## 2022-03-17 DIAGNOSIS — K6389 Other specified diseases of intestine: Secondary | ICD-10-CM

## 2022-03-17 NOTE — Progress Notes (Signed)
Gynecologic Oncology Telehealth Note: Gyn-Onc  I connected with Cynthia Hill on 03/17/22 at  1:45 PM EDT by telephone and verified that I am speaking with the correct person using two identifiers.  I discussed the limitations, risks, security and privacy concerns of performing an evaluation and management service by telemedicine and the availability of in-person appointments. I also discussed with the patient that there may be a patient responsible charge related to this service. The patient expressed understanding and agreed to proceed.  Other persons participating in the visit and their role in the encounter: brother, Cynthia Hill.  Patient's location: home Provider's location: [facility]  Reason for Visit: follow-up after recent surgery  Treatment History: Oncology History Overview Note  MMR intact   Endometrial cancer ([place])  01/05/2022 Initial Biopsy   EMB: Grade 1 endometrial adenocarcinoma   01/14/2022 Initial Diagnosis   Endometrial cancer ([place])   03/06/2022 Imaging   CT A/P on 6/5 IMPRESSION: 1. Central uterine mass, most consistent with the clinical history of endometrial carcinoma. 2. Focal sigmoid colonic wall and surrounding interstitial thickening, most consistent with carcinoma with probable transmural spread. 3. Abdominal and lower thoracic/retrocrural nodal metastasis which could be from either of the patient's primaries. Uterine metastasis slightly favored, although absence of pelvic sidewall adenopathy is somewhat atypical. Consider tissue sampling. 4. Cirrhosis and portal venous hypertension. 5. Coronary artery atherosclerosis. Aortic Atherosclerosis (ICD10-I70.0). 6. Aortic valvular calcifications. Consider echocardiography to evaluate for valvular dysfunction. 7. Left nephrolithiasis 8. Cholelithiasis   03/07/2022 Surgery   Robotic-assisted laparoscopic total hysterectomy with bilateral salpingo-oophorectomy, SLN injection bilaterally with no mapping to sentinel  lymph node, cystoscopy, repair of multiple vaginal lacerations including deep right sidewall laceration and posterior laceration  Findings: On EUA, narrow vagina; enlarged mobile uterus. On intra-abdominal entry, upper abdominal survey notable for cirrhotic appearing liver; normal stomach, small bowel and omentum. Sigmoid tethered to the left sidewall at the pelvic brim. Area that appeared somewhat firm with manipulation along the sigmoid colon (sitting out of the brim, to the right of midline) was noted. Uterus 10-12cm and very bulbous with multiple small subserosal fibroids. Bladder was moderately adherent to the cervix. Multi-cystic lesion on the left ovary. Some vesicular deposits on the left fallopian tube, concerning for metastatic disease versus endosalpingiosis. Otherwise, normal adnexa. Significant retroperitoneal fibrosis suspected to be cause of no ICG mapping in either pelvic basin. No obvious pelvic adenopathy. Given comorbidities and suspected varices (even in the pelvis, patient with significantly dilated vasculature and aberrant vasculature) as well as my prior discussion with the patient, decision made to defer pelvic lymph node assessment.  Cystoscopy revealed intact dome of the bladder, good efflux from bilateral ureteral orifices. Large right vaginal sidewall laceration noted. Posterior laceration also noted. Both required repair and vagina packed to help assure hemostasis.     Pathologic Stage   A. UTERUS, CERVIX, BILATERAL FALLOPIAN TUBES AND OVARIES:  - Endometrioid carcinoma, FIGO grade 1, see comment  - Carcinoma invades for a depth of 0.8 cm where myometrial thickness is  1 cm (80%)  - Carcinoma involves a fibrotic nodule within the left adnexa, most  likely atrophic and fibrotic left ovary  - Benign unremarkable bilateral fallopian tubes and right ovary  - Cervix is not involved  - See oncology table   Procedure: Total hysterectomy and bilateral salpingo-oophorectomy   Histologic Type: Endometrioid carcinoma  Histologic Grade: FIGO grade 1  Myometrial Invasion:       Depth of Myometrial Invasion (mm): 8 mm  Myometrial Thickness (mm): 10 mm       Percentage of Myometrial Invasion: 80%  Uterine Serosa Involvement: Not identified  Cervical stromal Involvement: Not identified  Extent of involvement of other tissue/organs: Carcinoma involves a  fibrotic nodule within the left adnexa, most likely an atrophic left  ovary  Peritoneal/Ascitic Fluid: Not applicable  Lymphovascular Invasion: Not identified  Regional Lymph Nodes: Not applicable (no lymph nodes submitted or found)      Interval History: Improving daily. Incisions healed. Denies pain. Still some constipation, using senokot. Denies urinary symptoms. Denies vaginal bleeding.   Past Medical/Surgical History: Past Medical History:  Diagnosis Date   Anemia    CHF (congestive heart failure) (HCC)    Chronic fatigue    Chronic pain    Fibroids    HTN (hypertension)    Hypothyroidism    Migraine    Mononucleosis     Past Surgical History:  Procedure Laterality Date   CYSTOSCOPY N/A 03/07/2022   Procedure: CYSTOSCOPY;  Surgeon: Lafonda Mosses, MD;  Location: [facility];  Service: Gynecology;  Laterality: N/A;   ROBOTIC ASSISTED TOTAL HYSTERECTOMY WITH BILATERAL SALPINGO OOPHERECTOMY Bilateral 03/07/2022   Procedure: XI ROBOTIC ASSISTED TOTAL HYSTERECTOMY WITH BILATERAL SALPINGO OOPHORECTOMY, VAGINAL LACERATION REPAIR;  Surgeon: Lafonda Mosses, MD;  Location: [facility];  Service: Gynecology;  Laterality: Bilateral;   TONSILLECTOMY      Family History  Problem Relation Age of Onset   Heart disease Mother    Blindness Mother    Heart disease Father    Prostate cancer Father    Stomach cancer Paternal Aunt    Endometrial cancer Neg Hx    Ovarian cancer Neg Hx    Pancreatic cancer Neg Hx    Colon cancer Neg Hx    Breast cancer Neg Hx     Social History   Socioeconomic  History   Marital status: Single    Spouse name: Not on file   Number of children: Not on file   Years of education: Not on file   Highest education level: Not on file  Occupational History   Not on file  Tobacco Use   Smoking status: Never   Smokeless tobacco: Never  Vaping Use   Vaping Use: Never used  Substance and Sexual Activity   Alcohol use: Never   Drug use: Never   Sexual activity: Not Currently  Other Topics Concern   Not on file  Social History Narrative   Not on file   Social Determinants of Health   Financial Resource Strain: Not on file  Food Insecurity: Not on file  Transportation Needs: Not on file  Physical Activity: Not on file  Stress: Not on file  Social Connections: Not on file    Current Medications:  Current Outpatient Medications:    colchicine 0.6 MG tablet, Take 0.6 mg by mouth daily., Disp: , Rfl:    ferrous sulfate 325 (65 FE) MG EC tablet, Take 1 tablet (325 mg total) by mouth daily with breakfast., Disp: 30 tablet, Rfl: 0   furosemide (LASIX) 40 MG tablet, Take 1 tablet (40 mg total) by mouth daily., Disp: 30 tablet, Rfl: 0   levothyroxine (SYNTHROID) 100 MCG tablet, Take 100 mcg by mouth daily before breakfast., Disp: , Rfl:    losartan (COZAAR) 25 MG tablet, Take 2 tablets (50 mg total) by mouth daily. (Patient taking differently: Take 25 mg by mouth in the morning.), Disp: 60 tablet, Rfl: 0   potassium chloride (KLOR-CON) 10  MEQ tablet, Take 20 mEq by mouth in the morning., Disp: , Rfl:    senna (SENOKOT) 8.6 MG TABS tablet, Take 1 tablet (8.6 mg total) by mouth at bedtime for 15 days., Disp: 15 tablet, Rfl: 0   traMADol (ULTRAM) 50 MG tablet, Take 1 tablet (50 mg total) by mouth every 6 (six) hours as needed for up to 12 doses., Disp: 12 tablet, Rfl: 0  Review of Symptoms: Pertinent positives as per HPI.  Physical Exam: There were no vitals taken for this visit. For given limitations of phone visit.  Laboratory & Radiologic  Studies: A. UTERUS, CERVIX, BILATERAL FALLOPIAN TUBES AND OVARIES:  - Endometrioid carcinoma, FIGO grade 1, see comment  - Carcinoma invades for a depth of 0.8 cm where myometrial thickness is  1 cm (80%)  - Carcinoma involves a fibrotic nodule within the left adnexa, most  likely atrophic and fibrotic left ovary  - Benign unremarkable bilateral fallopian tubes and right ovary  - Cervix is not involved  - See oncology table   COMMENT:   Tumor within the endometrium and the fallopian tube is positive for CK7  and PAX8 while it is negative for CK20 and CDX2, consistent with a  gynecologic primary.   Assessment & Plan: Cynthia Hill is a 71 y.o. woman with at least stage IIIA grade 1 endometrioid endometrial adenocarcinoma and concern for concurrent colon cancer s/p recent robotic hysterectomy who presents for phone visit. MMRp.  Patient is overall doing well after surgery and meeting milestones.  Discussed continued restrictions and postoperative expectations.  Reviewed pathology in detail with the patient and her brother.  Given disease outside of the uterus (adnexa) and upper abdominal lymph nodes, which I am unsure at this time whether they are related to the mass seen in her sigmoid colon or to her uterine cancer, recommendation is for adjuvant systemic therapy.  The patient very quickly voices that she does not think that she is strong enough and thinks she is too old to receive chemotherapy.  I have asked her to wait on making any decisions about adjuvant treatment until we have additional information from her GI work-up.  After she has had her colonoscopy, I would like her to meet with one of our medical oncologist to discuss systemic treatment.  She is open to this possibility.  At this time, the patient is not scheduled with GI yet.  A referral has been placed in the GI office has reached out to the patient to get things scheduled.  Cynthia Hill, her brother, tells me that they are  going to discuss things again after we get off the phone and he will reach out to get the visit scheduled.  I stressed the importance of this work-up in terms of determining next steps in treatment.  I discussed the assessment and treatment plan with the patient. The patient was provided with an opportunity to ask questions and all were answered. The patient agreed with the plan and demonstrated an understanding of the instructions.   The patient was advised to call back or see an in-person evaluation if the symptoms worsen or if the condition fails to improve as anticipated.   18 minutes of total time was spent for this patient encounter, including preparation, face-to-face counseling with the patient and coordination of care, and documentation of the encounter.   Jeral Pinch, MD  Division of Gynecologic Oncology  Department of Obstetrics and Gynecology  [facility]

## 2022-03-17 NOTE — Telephone Encounter (Signed)
Received phone call from patients brother. Patient has appointment with [facility] on 04/21/22. Dr. Berline Lopes notified.

## 2022-03-17 NOTE — Telephone Encounter (Signed)
Per providers following up with patients brother to inquire if GI appointment was due to patients schedule or the GI offices next available as the patient needs to be seen sooner.  Patients brother reports the first available appointment was 7/18. He will be out of town 6/29-7/3 and 7/12-7/16. They will not be available for appointments on 04/18/22.  RN spoke with [facility] and moved patients appointment to 03/28/22 at 10:10am. Patients brother notified and he is in agreement of new appointment date and time. Instructed to call with any needs.

## 2022-03-22 DIAGNOSIS — I509 Heart failure, unspecified: Secondary | ICD-10-CM | POA: Diagnosis not present

## 2022-03-22 DIAGNOSIS — I10 Essential (primary) hypertension: Secondary | ICD-10-CM | POA: Diagnosis not present

## 2022-03-22 DIAGNOSIS — E039 Hypothyroidism, unspecified: Secondary | ICD-10-CM | POA: Diagnosis not present

## 2022-03-22 DIAGNOSIS — D500 Iron deficiency anemia secondary to blood loss (chronic): Secondary | ICD-10-CM | POA: Diagnosis not present

## 2022-03-27 ENCOUNTER — Other Ambulatory Visit: Payer: Self-pay | Admitting: Oncology

## 2022-03-27 ENCOUNTER — Other Ambulatory Visit: Payer: Self-pay

## 2022-03-27 ENCOUNTER — Inpatient Hospital Stay (HOSPITAL_BASED_OUTPATIENT_CLINIC_OR_DEPARTMENT_OTHER): Payer: Medicare Other | Admitting: Gynecologic Oncology

## 2022-03-27 ENCOUNTER — Encounter: Payer: Self-pay | Admitting: Gynecologic Oncology

## 2022-03-27 VITALS — BP 143/67 | HR 86 | Temp 98.5°F | Resp 16 | Ht 60.0 in | Wt 179.8 lb

## 2022-03-27 DIAGNOSIS — Z90710 Acquired absence of both cervix and uterus: Secondary | ICD-10-CM

## 2022-03-27 DIAGNOSIS — C541 Malignant neoplasm of endometrium: Secondary | ICD-10-CM

## 2022-03-27 DIAGNOSIS — Z90722 Acquired absence of ovaries, bilateral: Secondary | ICD-10-CM

## 2022-03-27 DIAGNOSIS — L03311 Cellulitis of abdominal wall: Secondary | ICD-10-CM

## 2022-03-27 DIAGNOSIS — Z7189 Other specified counseling: Secondary | ICD-10-CM

## 2022-03-27 MED ORDER — CEPHALEXIN 500 MG PO CAPS: 500.0000 mg | ORAL_CAPSULE | Freq: Four times a day (QID) | ORAL | 0 refills | Status: AC

## 2022-03-27 NOTE — Progress Notes (Signed)
Gynecologic Oncology Multi-Disciplinary Disposition Conference Note  Date of the Conference: 03/27/2022  Patient Name: Cynthia Hill  Referring Provider: Dr. Dion Body Primary GYN Oncologist: Dr. Pricilla Holm   Stage/Disposition:  Stage IIIA, grade 1 endometrioid endometrial adenocarcinoma and concern for concurrent colon cancer. Disposition is for adjuvant systemic therapy after GI workup. Also referral for genetic counseling.   This Multidisciplinary conference took place involving physicians from Gynecologic Oncology, Medical Oncology, Radiation Oncology, Pathology, Radiology along with the Gynecologic Oncology Nurse Practitioner and Gynecologic Oncology Nurse Navigator.  Comprehensive assessment of the patient's malignancy, staging, need for surgery, chemotherapy, radiation therapy, and need for further testing were reviewed. Supportive measures, both inpatient and following discharge were also discussed. The recommended plan of care is documented. Greater than 35 minutes were spent correlating and coordinating this patient's care.

## 2022-03-28 ENCOUNTER — Ambulatory Visit (INDEPENDENT_AMBULATORY_CARE_PROVIDER_SITE_OTHER): Payer: Medicare Other | Admitting: Internal Medicine

## 2022-03-28 VITALS — BP 144/70 | HR 92 | Ht 60.0 in | Wt 182.0 lb

## 2022-03-28 DIAGNOSIS — K639 Disease of intestine, unspecified: Secondary | ICD-10-CM

## 2022-03-28 MED ORDER — NA SULFATE-K SULFATE-MG SULF 17.5-3.13-1.6 GM/177ML PO SOLN: 1.0000 | Freq: Once | ORAL | 0 refills | Status: AC

## 2022-03-30 ENCOUNTER — Telehealth: Payer: Self-pay

## 2022-03-30 NOTE — Telephone Encounter (Signed)
Following up with Ms. Cynthia Hill regarding her edema and erythema on her pannus. Patient reports that redness has resolved, it did not spread beyond the line that Dr. Berline Lopes drew. "I feel fine, it was not bothersome." She denies redness, swelling, fever, chills, nausea or vomiting. She endorses eating and drinking as normal. Patient is still taking her antibiotics. Instructed her to continue taking these until she finishes the prescription. Patient verbalizes understanding. Instructed to call with any questions, concerns or if she feels unwell.

## 2022-04-06 ENCOUNTER — Encounter (HOSPITAL_COMMUNITY): Payer: Self-pay | Admitting: Gynecologic Oncology

## 2022-04-07 ENCOUNTER — Other Ambulatory Visit: Payer: Self-pay | Admitting: Gynecologic Oncology

## 2022-04-07 DIAGNOSIS — C541 Malignant neoplasm of endometrium: Secondary | ICD-10-CM

## 2022-04-07 DIAGNOSIS — K6389 Other specified diseases of intestine: Secondary | ICD-10-CM

## 2022-04-07 DIAGNOSIS — R599 Enlarged lymph nodes, unspecified: Secondary | ICD-10-CM

## 2022-04-10 ENCOUNTER — Telehealth: Payer: Self-pay | Admitting: *Deleted

## 2022-04-10 NOTE — Telephone Encounter (Signed)
Spoke with the patient's son and moved the telephone visit from 7/11 to 8/2 after the patient's colonoscopy. Also scheduled the patient for genetic testing and labs with the son.

## 2022-04-11 ENCOUNTER — Inpatient Hospital Stay: Payer: Medicare Other | Admitting: Gynecologic Oncology

## 2022-04-18 ENCOUNTER — Ambulatory Visit (INDEPENDENT_AMBULATORY_CARE_PROVIDER_SITE_OTHER): Payer: Medicare Other | Admitting: Internal Medicine

## 2022-04-18 ENCOUNTER — Encounter: Payer: Self-pay | Admitting: Internal Medicine

## 2022-04-18 VITALS — BP 150/68 | HR 76 | Ht 60.0 in | Wt 183.4 lb

## 2022-04-18 DIAGNOSIS — I5031 Acute diastolic (congestive) heart failure: Secondary | ICD-10-CM | POA: Diagnosis not present

## 2022-04-18 MED ORDER — LOSARTAN POTASSIUM 25 MG PO TABS
25.0000 mg | ORAL_TABLET | Freq: Every morning | ORAL | Status: DC
Start: 2022-04-18 — End: 2022-12-15

## 2022-04-18 MED ORDER — AMLODIPINE BESYLATE 5 MG PO TABS: 5.0000 mg | ORAL_TABLET | Freq: Every day | ORAL | 3 refills | Status: DC

## 2022-04-18 NOTE — Progress Notes (Signed)
Cardiology Office Note:    Date:  04/18/2022   ID:  [name], DOB [birth year], MRN [id number]  PCP:  Lyman Bishop, DO   [facility] Providers Cardiologist:  Janina Mayo, MD     Referring MD: Lyman Bishop, DO   No chief complaint on file. SOB  History of Present Illness:    Cynthia Hill is a 71 y.o. female with a hx of HTN, HFpEF, iron deficiency anemia  She was admitted to the hospital with acute hypoxic respiratory failure. Concerning for HfPEF, BNP 640. Echo showed moderate pericardial effusion, no tamponade. Unknown etiology. She was started on colchicine and nsaids. Normal EF, grade II DDD. She also had c/f posterior leaflet prolapse. She was diuresed with IV lasix 40 mg BID.  She put out > 8 L. Notably she had significant anemia with hgb 4.6 contributed to her dyspnea. She received 2 units PRBCs. She received IV iron. She had frequent vaginal bleeding found to have a uterine mass. She was discharged on lasix 40 mg daily. She was started on losartan 25 mg daily. Was hesitant to start medications. She was [facility] at [facility] seen by a new provider. Dr. Curly Rim. She noted that she did not prefer to use Western medicine. She recently moved to [place] from [place]. Noted to have poor insight, may have some MCI. Her TSH is 12 which is very high and improved to 8.4. Her synthroid was increased to 100. Blood pressures are in a good range.  Marland Kitchen She was hospitalized March 2022 with decompensated heart failure.  She has no LE edema, no PND or orthopnea. She weighs 178-182. She is taking a diuretic. She denies chest pressure.  She was diagnosed with uterine CA last Thursday per her brother. She lives on her own. Not adding salt to her foods  Interim Hx: 04/18/2017 She is following with oncology for Stage IIIA, grade endometrioid endometrial adenocarcinoma. She is s/p robotic total hysterectomy w/ BL salpingo-oophorectomy.  She had evidence of colonic thickening  with c/f carcinoma Colonoscopy is planned at the end of the month. She is not having shortness of breath. She denies PND or orthopnea.  Mild trace edema.     Wt Readings from Last 3 Encounters:  04/18/22 183 lb 6.4 oz (83.2 kg)  03/28/22 182 lb (82.6 kg)  03/27/22 179 lb 12.8 oz (81.6 kg)      Past Medical History:  Diagnosis Date   Anemia    CHF (congestive heart failure) (HCC)    Chronic fatigue    Chronic pain    Fibroids    HTN (hypertension)    Hypothyroidism    Migraine    Mononucleosis     Past Surgical History:  Procedure Laterality Date   CYSTOSCOPY N/A 03/07/2022   Procedure: CYSTOSCOPY;  Surgeon: Lafonda Mosses, MD;  Location: [facility];  Service: Gynecology;  Laterality: N/A;   ROBOTIC ASSISTED TOTAL HYSTERECTOMY WITH BILATERAL SALPINGO OOPHERECTOMY Bilateral 03/07/2022   Procedure: XI ROBOTIC ASSISTED TOTAL HYSTERECTOMY WITH BILATERAL SALPINGO OOPHORECTOMY, VAGINAL LACERATION REPAIR;  Surgeon: Lafonda Mosses, MD;  Location: [facility];  Service: Gynecology;  Laterality: Bilateral;   TONSILLECTOMY      Current Medications: No outpatient medications have been marked as taking for the 04/18/22 encounter (Appointment) with Janina Mayo, MD.     Allergies:   Aspirin, Sulfa antibiotics, Albuterol, Atenolol, Bactroban [mupirocin], Benzocaine, Bumex [bumetanide], Levaquin [levofloxacin], Morphine and related, Other, Propranolol, Ptu [propylthiouracil], Spironolactone, Tapazole [methimazole], Tetracyclines & related, Toprol  xl [metoprolol], Triamterene, and Valium [diazepam]   Social History   Socioeconomic History   Marital status: Single    Spouse name: Not on file   Number of children: Not on file   Years of education: Not on file   Highest education level: Not on file  Occupational History   Not on file  Tobacco Use   Smoking status: Never   Smokeless tobacco: Never  Vaping Use   Vaping Use: Never used  Substance and Sexual Activity   Alcohol use:  Never   Drug use: Never   Sexual activity: Not Currently  Other Topics Concern   Not on file  Social History Narrative   Not on file   Social Determinants of Health   Financial Resource Strain: Not on file  Food Insecurity: Not on file  Transportation Needs: Not on file  Physical Activity: Not on file  Stress: Not on file  Social Connections: Not on file     Family History: The patient's family history includes Blindness in her mother; Heart disease in her father and mother; Prostate cancer in her father; Stomach cancer in her paternal aunt. There is no history of Endometrial cancer, Ovarian cancer, Pancreatic cancer, Colon cancer, or Breast cancer.  ROS:   Please see the history of present illness.     All other systems reviewed and are negative.  EKGs/Labs/Other Studies Reviewed:    The following studies were reviewed today:   EKG:  EKG is  ordered today.  The ekg ordered today demonstrates   EKG - 01/10/2022 NSR, LAFB  Recent Labs: 11/18/2021: B Natriuretic Peptide 640.1 11/19/2021: TSH 12.793 11/22/2021: Magnesium 1.8 02/22/2022: ALT 19 03/08/2022: BUN 25; Creatinine, Ser 1.21; Hemoglobin 9.4; Platelets 152; Potassium 4.3; Sodium 137  Recent Lipid Panel No results found for: "CHOL", "TRIG", "HDL", "CHOLHDL", "VLDL", "LDLCALC", "LDLDIRECT"   Risk Assessment/Calculations:      Physical Exam:    VS: Vitals:   04/18/22 1530  BP: (!) 150/68  Pulse: 76  SpO2: 99%     Wt Readings from Last 3 Encounters:  03/28/22 182 lb (82.6 kg)  03/27/22 179 lb 12.8 oz (81.6 kg)  03/07/22 181 lb 7 oz (82.3 kg)     GEN:  Well nourished, well developed in no acute distress HEENT: Normal NECK: No JVD; No carotid bruits LYMPHATICS: No lymphadenopathy CARDIAC: significant holosystolic murmur heard throughout, rubs, gallops RESPIRATORY:  Clear to auscultation without rales, wheezing or rhonchi  ABDOMEN: Soft, non-tender, non-distended MUSCULOSKELETAL:  No edema; No deformity  Venous stasis, no LE edema SKIN: Warm and dry NEUROLOGIC:  Alert and oriented x 3 PSYCHIATRIC:  Normal affect   ASSESSMENT:    #HFpEF: complicated by ?prolapse posterior leaflet, jet is anteriorly directed and MR is moderate. Crt in a good range baseline around 1. She is euvolemic today. Can continue her diuretic.  - continue lasix 40 mg daily; K supplement; double if symptoms - will continue to follow up and if symptoms progress will get TEE to assess mitral valve ( not a surgical candidate; she has evidence of MCI, older/ mitraclip may be an option)  #Pericardial Effusion: no pleuritic pain to suggest ongoing pericarditis. She can stop the colchicine post ~3 months of therapy. It was moderate in size, no tamponade. In the setting of malignancy  #CardioOnc:She had colonic thickening and saw GI this c/f carcinoma, she is pending colonoscopy. She has endometrioid endometrial adenocarcinoma s/p total hysterectomy. Pending final treatment plan  #HTN- elevated. Start norvasc 5 mg  daily and continue losartan 25 mg daily. Recommended   PLAN:    In order of problems listed above:  Start norvasc 5 mg daily Compression stockings 8-15 mmhg Follow up 6 months      Medication Adjustments/Labs and Tests Ordered: Current medicines are reviewed at length with the patient today.  Concerns regarding medicines are outlined above.  No orders of the defined types were placed in this encounter.  No orders of the defined types were placed in this encounter.   There are no Patient Instructions on file for this visit.   Signed, Janina Mayo, MD  04/18/2022 2:13 PM    [facility]

## 2022-04-18 NOTE — Patient Instructions (Addendum)
Medication Instructions:  START: AMLODIPINE '5mg'$  ONCE DAILY  *If you need a refill on your cardiac medications before your next appointment, please call your pharmacy*  Lab Work: None Ordered At This Time.  If you have labs (blood work) drawn today and your tests are completely normal, you will receive your results only by: [place] (if you have MyChart) OR A paper copy in the mail If you have any lab test that is abnormal or we need to change your treatment, we will call you to review the results.  Testing/Procedures: None Ordered At This Time.   Follow-Up: At [facility], you and your health needs are our priority.  As part of our continuing mission to provide you with exceptional heart care, we have created designated Provider Care Teams.  These Care Teams include your primary Cardiologist (physician) and Advanced Practice Providers (APPs -  Physician Assistants and Nurse Practitioners) who all work together to provide you with the care you need, when you need it.  Your next appointment:   6 month(s)  The format for your next appointment:   In Person  Provider:   Janina Mayo, MD    Other Instructions The blood pressure goal is < 130/90 mmHg. You can check once a week. Let us know if your blood pressures remain elevated. If she gains > 5 pounds in a week and shortness of breath recommend doubling your lasix dose to 40 mg twice a day for 5 days.  [place]

## 2022-04-21 ENCOUNTER — Telehealth: Payer: Self-pay | Admitting: Internal Medicine

## 2022-04-21 ENCOUNTER — Telehealth: Payer: Self-pay | Admitting: *Deleted

## 2022-04-21 ENCOUNTER — Ambulatory Visit: Payer: Medicare Other | Admitting: Internal Medicine

## 2022-04-21 NOTE — Telephone Encounter (Signed)
Patient scheduled on Tuesday for a colonoscopy called to cancelled. Patient said no longer wants further treatment.

## 2022-04-21 NOTE — Telephone Encounter (Signed)
Patient's son called and stated "Cynthia Hill has decided she doesn't want to pursue any more treatment. Please cancel her telephone visit for 8/2."  Appt canceled

## 2022-04-27 ENCOUNTER — Ambulatory Visit: Payer: Medicare Other | Admitting: Adult Health

## 2022-04-28 ENCOUNTER — Encounter: Payer: Medicare Other | Admitting: Internal Medicine

## 2022-05-03 ENCOUNTER — Telehealth: Payer: Medicare Other | Admitting: Gynecologic Oncology

## 2022-05-03 ENCOUNTER — Telehealth: Payer: Self-pay | Admitting: *Deleted

## 2022-05-03 NOTE — Telephone Encounter (Signed)
Spoke with the patient's brother and they will keep the genetics appts for 8/9

## 2022-05-10 ENCOUNTER — Other Ambulatory Visit: Payer: Medicare Other

## 2022-05-10 ENCOUNTER — Encounter: Payer: Medicare Other | Admitting: Genetic Counselor

## 2022-05-22 ENCOUNTER — Telehealth: Payer: Self-pay

## 2022-05-22 NOTE — Telephone Encounter (Signed)
LM for Mr. Henrene Pastor that Dr. Berline Lopes wanted [place] to touch base and see how Ms Frater was doing. Dr. Berline Lopes would be glad to see Ms [place] for follow up if she so wishes. He can call the office at [phone] if our office can be of help to the family.

## 2022-06-07 DIAGNOSIS — R300 Dysuria: Secondary | ICD-10-CM | POA: Diagnosis not present

## 2022-06-07 DIAGNOSIS — I872 Venous insufficiency (chronic) (peripheral): Secondary | ICD-10-CM | POA: Diagnosis not present

## 2022-06-07 DIAGNOSIS — R82998 Other abnormal findings in urine: Secondary | ICD-10-CM | POA: Diagnosis not present

## 2022-10-05 DIAGNOSIS — R350 Frequency of micturition: Secondary | ICD-10-CM | POA: Diagnosis not present

## 2022-10-05 DIAGNOSIS — M545 Low back pain, unspecified: Secondary | ICD-10-CM | POA: Diagnosis not present

## 2022-10-29 DIAGNOSIS — R309 Painful micturition, unspecified: Secondary | ICD-10-CM | POA: Diagnosis not present

## 2022-10-29 DIAGNOSIS — N390 Urinary tract infection, site not specified: Secondary | ICD-10-CM | POA: Diagnosis not present

## 2022-11-15 IMAGING — CR DG CHEST 2V
2 series · 2 of 2 positions shown · non-contrast
Comparison: None.

CLINICAL DATA: Cough and shortness of breath x2 weeks.

EXAM:
CHEST - 2 VIEW

[w chest lat]
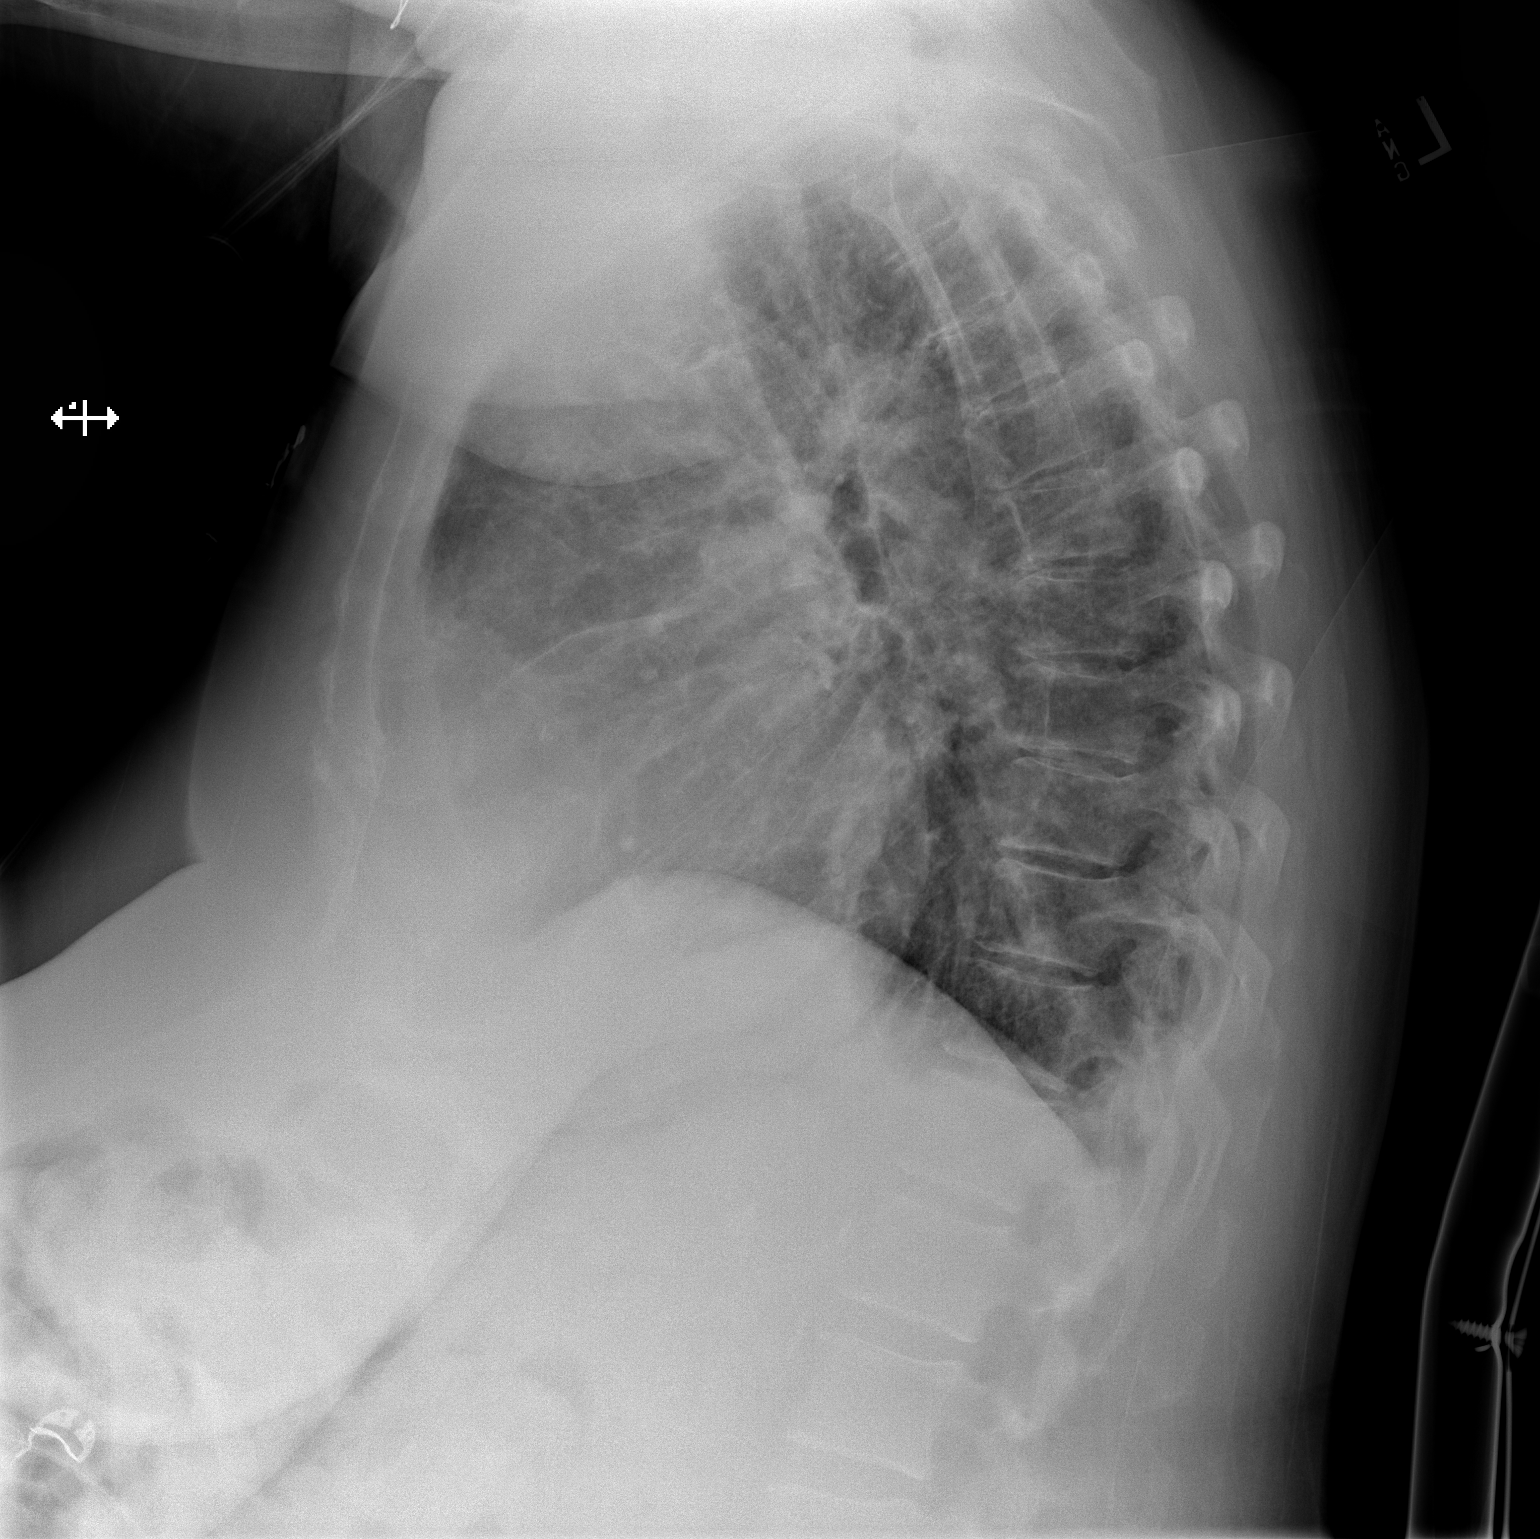

[x chest ap]
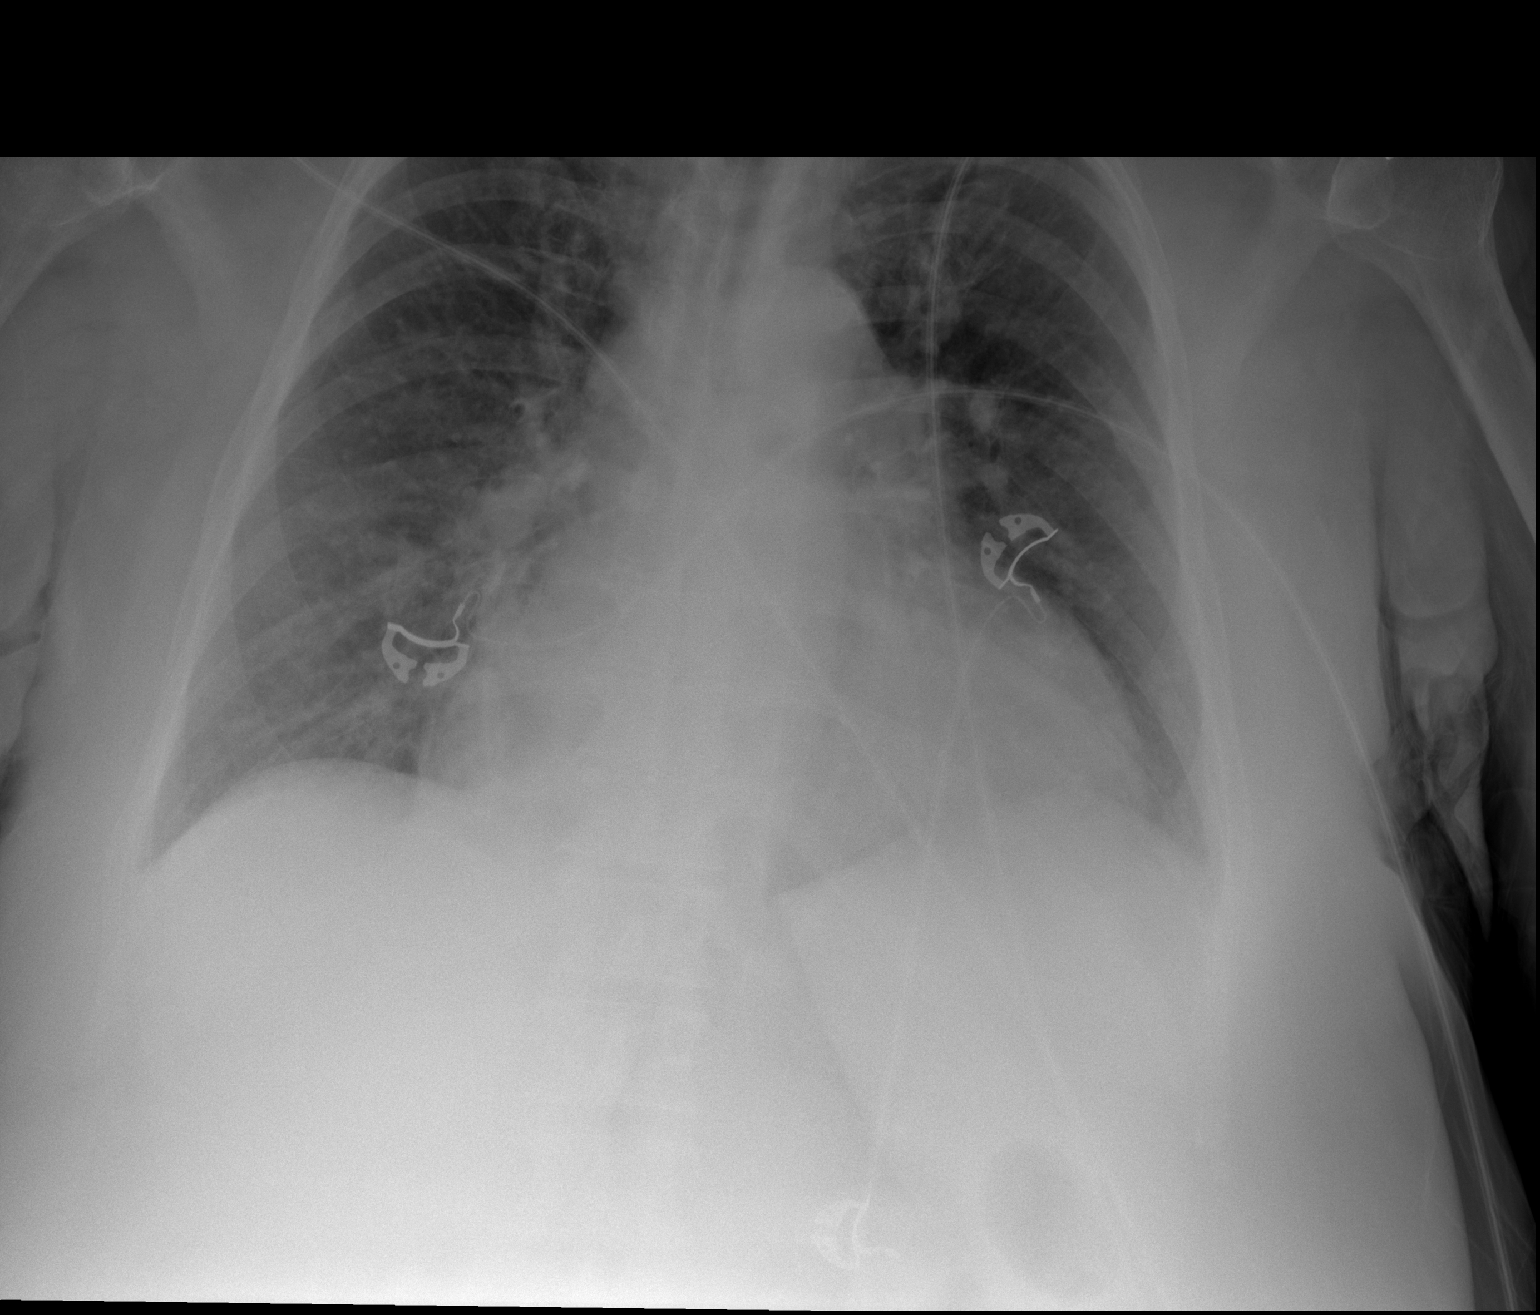

[2 of 2 positions shown; findings below may reference images not displayed]

FINDINGS: Mildly increased interstitial lung markings are seen with mild
prominence of the pulmonary vasculature. There is no evidence of a
pleural effusion or pneumothorax. The cardiac silhouette is markedly
enlarged. The visualized skeletal structures are unremarkable.
IMPRESSION: Cardiomegaly with mild congestive heart failure.

## 2022-11-18 IMAGING — US US PELVIS COMPLETE WITH TRANSVAGINAL
1 series · 13 of 25 positions shown · non-contrast
Comparison: None

CLINICAL DATA: Postmenopausal vaginal bleeding.

EXAM:
TRANSABDOMINAL AND TRANSVAGINAL ULTRASOUND OF PELVIS
TECHNIQUE: Both transabdominal and transvaginal ultrasound examinations of the
pelvis were performed. Transabdominal technique was performed for
global imaging of the pelvis including uterus, ovaries, adnexal
regions, and pelvic cul-de-sac. It was necessary to proceed with
endovaginal exam following the transabdominal exam to visualize the
uterus, endometrium and adnexa.

[Series 1: us pelvis complete with transvaginal · 80 acquisitions, 13 frames shown]
[im 1/80]
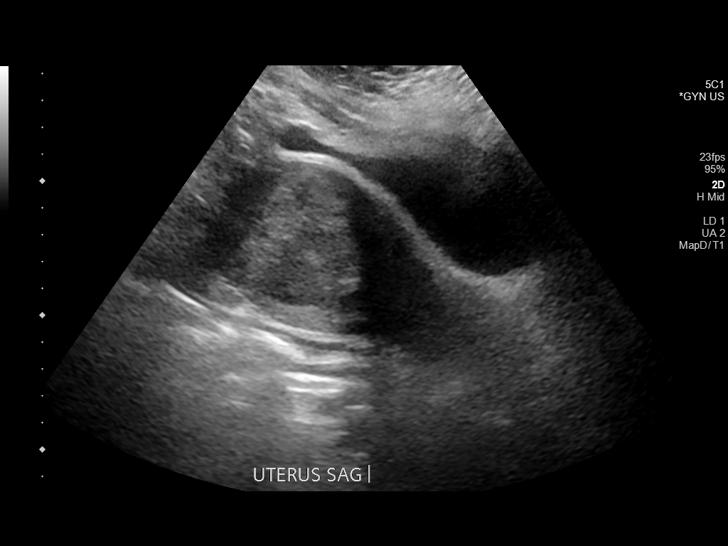
[im 7/80]
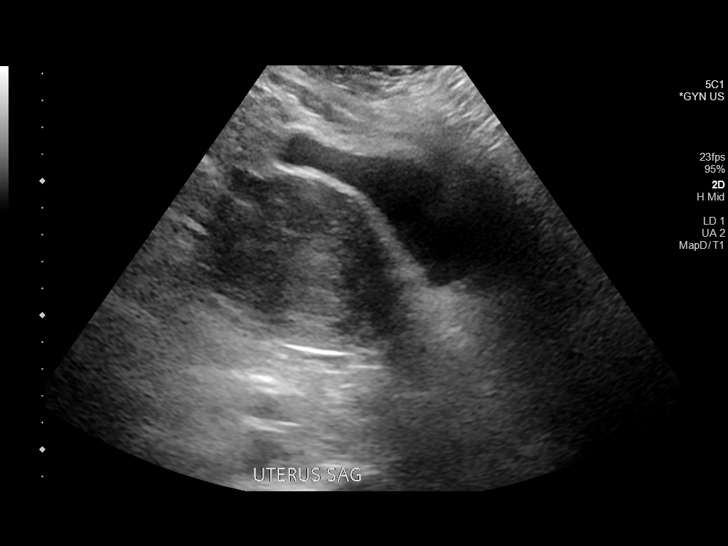
[im 14/80]
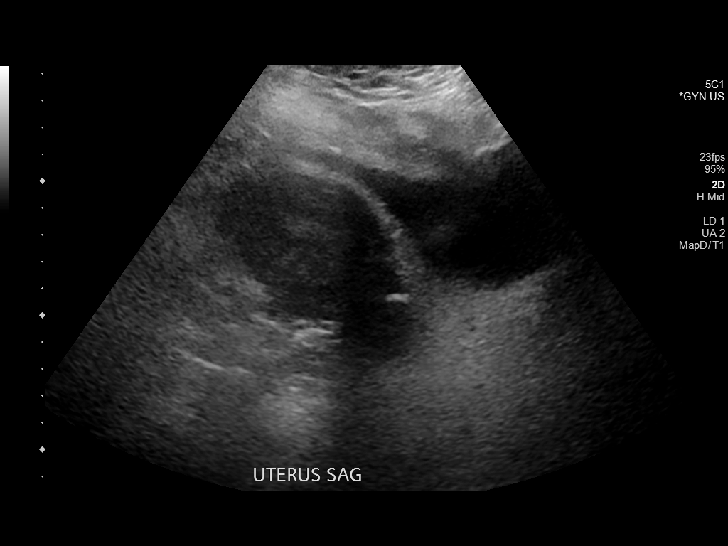
[im 20/80]
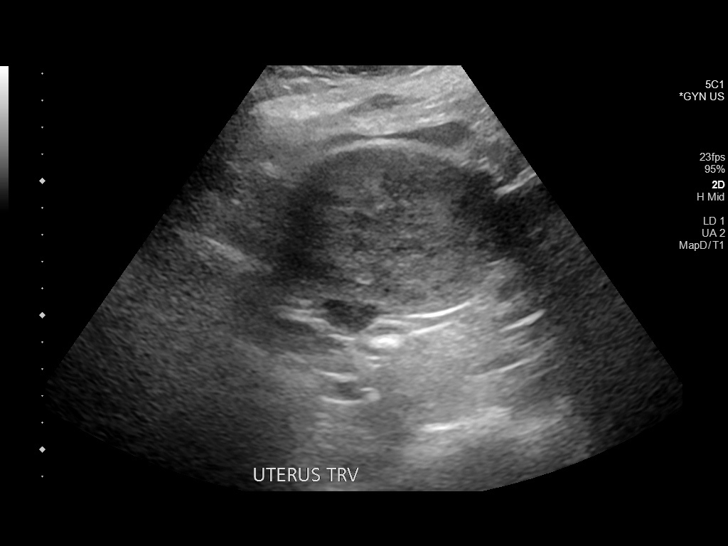
[im 27/80]
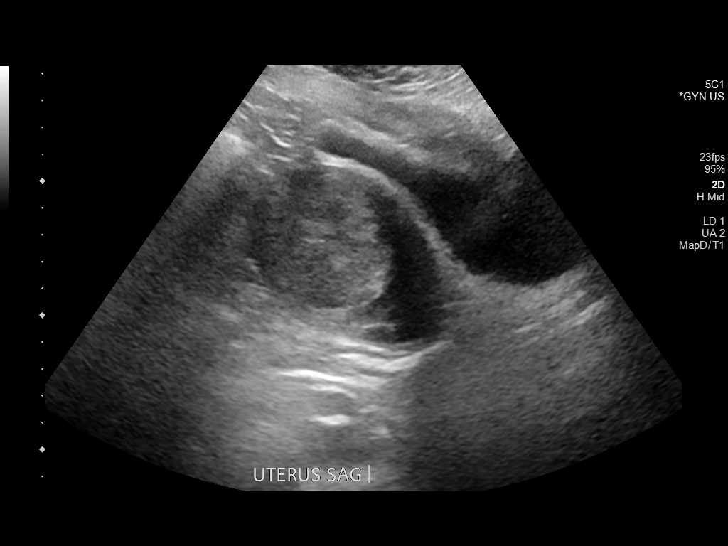
[im 33/80]
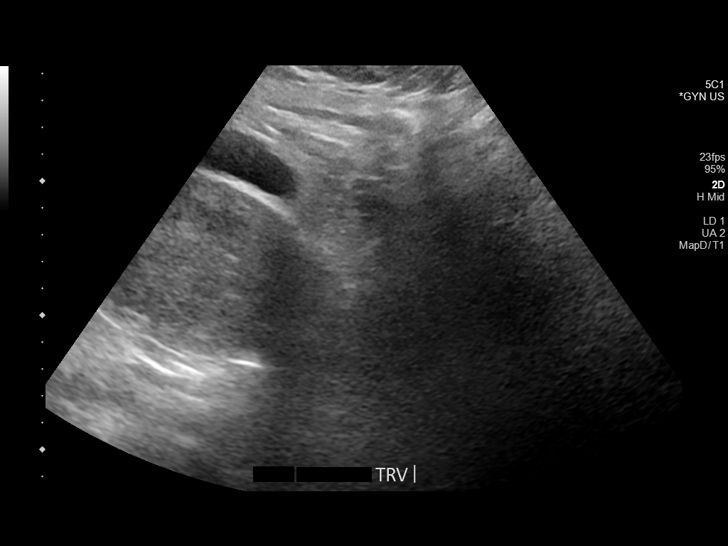
[im 40/80]
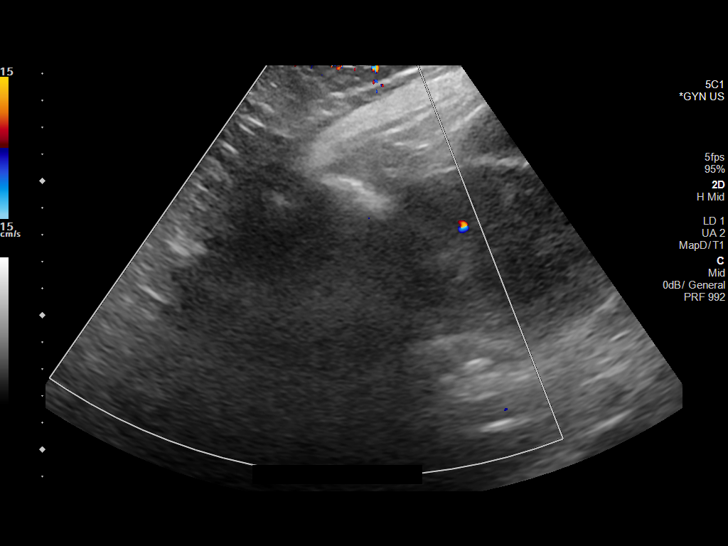
[im 47/80]
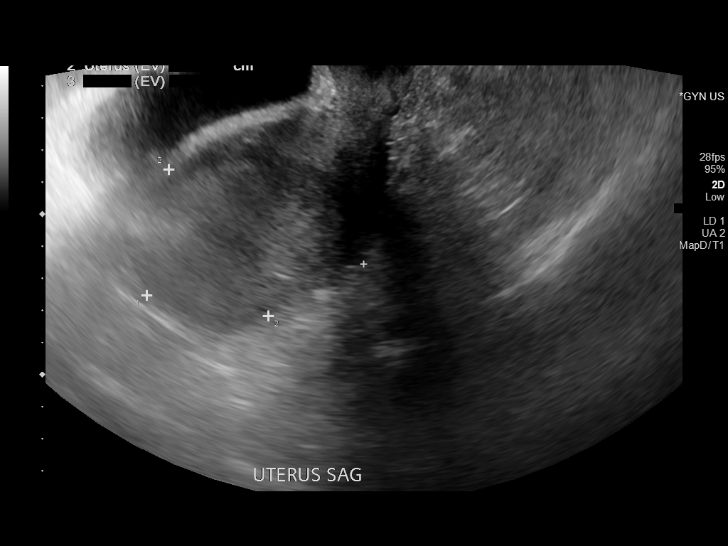
[im 53/80]
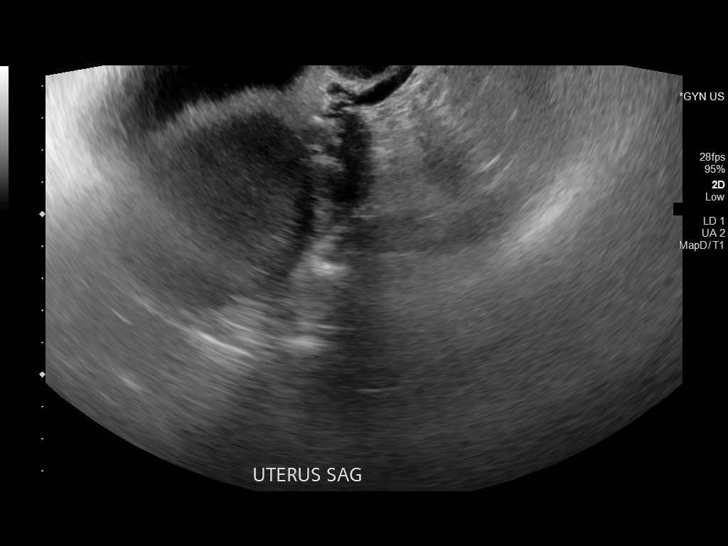
[im 60/80]
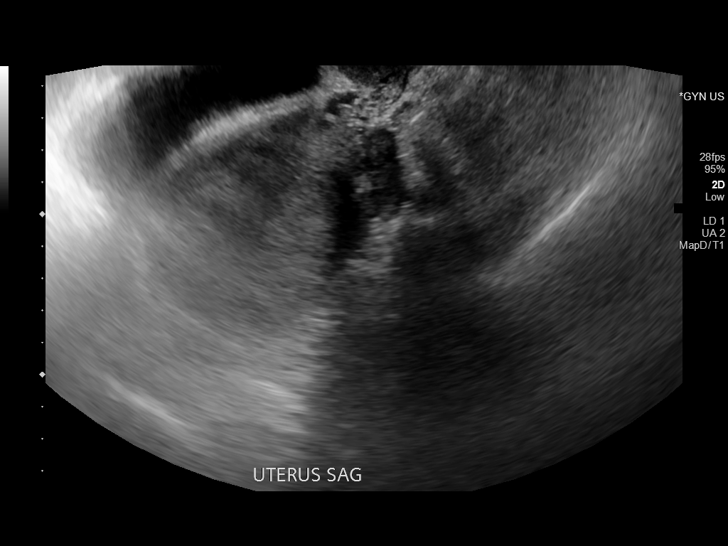
[im 66/80]
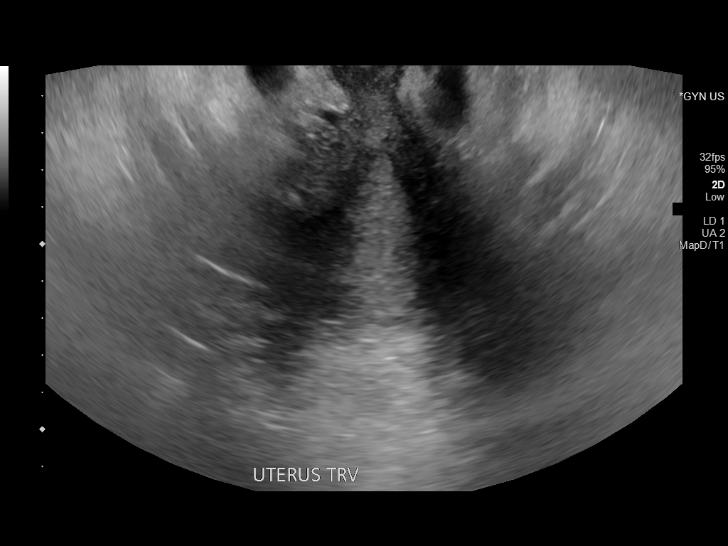
[im 73/80]
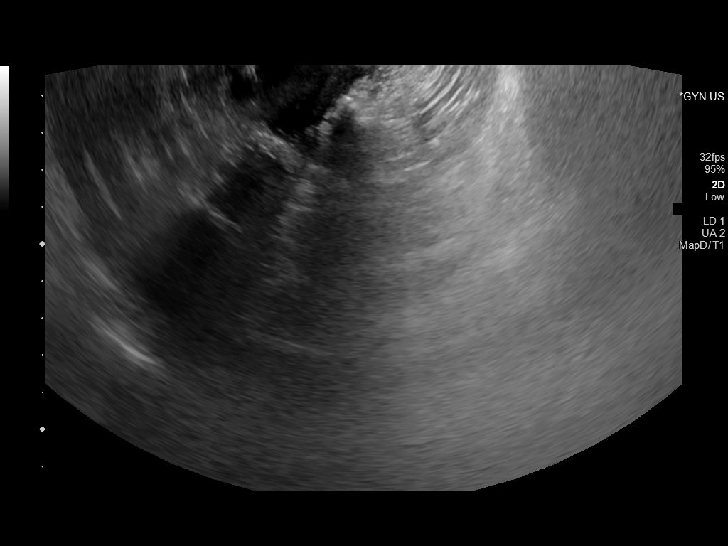
[im 80/80]
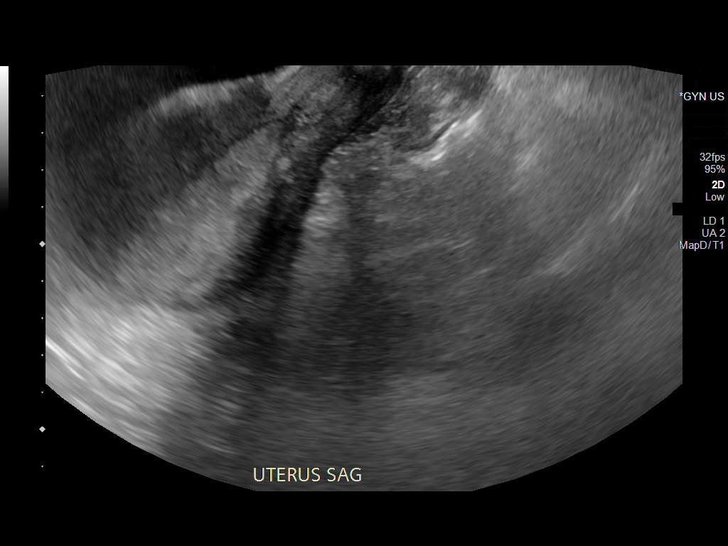

[13 of 25 positions shown; findings below may reference images not displayed]

FINDINGS: Uterus

Measurements: 11.3 x 5.5 x 5.5 cm = volume: 179 mL. There is a
rounded echogenic structure in the central uterus measuring 6.0 x
5.0 x 7.2 cm. It is unclear if this represents a myometrial or
endometrial lesion.

Endometrium

Not well-defined. Rounded echogenic structure in the central uterus
measuring 6.0 x 5.0 x 7.2 cm, clear if this represents a myometrial
or endometrial lesion.

Right ovary

Not visualized.

Left ovary

Not visualized.

Other findings

No adnexal mass or pelvic free fluid. Examination is technically
challenging and limited due to body habitus and inability to lay
flat.
IMPRESSION: Technically limited exam. Central rounded echogenic structure in the
uterus measuring 6 x 5 x 7.2 cm, unclear if this represents a
myometrial lesion such as fibroid or an endometrial lesion. This is
not well characterized by ultrasound. The endometrium is otherwise
obscured. Further evaluation could be considered with pelvic MRI,
however patient would have to tolerate lying flat and motionless for
that exam.

## 2022-12-14 ENCOUNTER — Emergency Department (HOSPITAL_COMMUNITY): Payer: 59

## 2022-12-14 ENCOUNTER — Observation Stay (HOSPITAL_COMMUNITY)
Admission: EM | Admit: 2022-12-14 | Discharge: 2022-12-15 | Disposition: A | Discharge status: Hospice | Payer: 59 | Attending: Family Medicine | Admitting: Family Medicine

## 2022-12-14 ENCOUNTER — Encounter (HOSPITAL_COMMUNITY): Payer: Self-pay

## 2022-12-14 DIAGNOSIS — G9341 Metabolic encephalopathy: Secondary | ICD-10-CM | POA: Insufficient documentation

## 2022-12-14 DIAGNOSIS — E039 Hypothyroidism, unspecified: Secondary | ICD-10-CM | POA: Insufficient documentation

## 2022-12-14 DIAGNOSIS — K760 Fatty (change of) liver, not elsewhere classified: Secondary | ICD-10-CM | POA: Diagnosis not present

## 2022-12-14 DIAGNOSIS — R000 Tachycardia, unspecified: Secondary | ICD-10-CM | POA: Diagnosis not present

## 2022-12-14 DIAGNOSIS — E872 Acidosis, unspecified: Secondary | ICD-10-CM | POA: Diagnosis not present

## 2022-12-14 DIAGNOSIS — N179 Acute kidney failure, unspecified: Secondary | ICD-10-CM | POA: Insufficient documentation

## 2022-12-14 DIAGNOSIS — Z79899 Other long term (current) drug therapy: Secondary | ICD-10-CM | POA: Insufficient documentation

## 2022-12-14 DIAGNOSIS — R180 Malignant ascites: Secondary | ICD-10-CM | POA: Insufficient documentation

## 2022-12-14 DIAGNOSIS — I5030 Unspecified diastolic (congestive) heart failure: Secondary | ICD-10-CM | POA: Insufficient documentation

## 2022-12-14 DIAGNOSIS — R6520 Severe sepsis without septic shock: Secondary | ICD-10-CM | POA: Insufficient documentation

## 2022-12-14 DIAGNOSIS — A419 Sepsis, unspecified organism: Secondary | ICD-10-CM | POA: Diagnosis not present

## 2022-12-14 DIAGNOSIS — N200 Calculus of kidney: Secondary | ICD-10-CM | POA: Diagnosis not present

## 2022-12-14 DIAGNOSIS — D72829 Elevated white blood cell count, unspecified: Secondary | ICD-10-CM | POA: Diagnosis not present

## 2022-12-14 DIAGNOSIS — C541 Malignant neoplasm of endometrium: Secondary | ICD-10-CM | POA: Diagnosis not present

## 2022-12-14 DIAGNOSIS — I110 Hypertensive heart disease with heart failure: Secondary | ICD-10-CM | POA: Insufficient documentation

## 2022-12-14 DIAGNOSIS — C799 Secondary malignant neoplasm of unspecified site: Secondary | ICD-10-CM | POA: Diagnosis not present

## 2022-12-14 DIAGNOSIS — R918 Other nonspecific abnormal finding of lung field: Secondary | ICD-10-CM | POA: Diagnosis not present

## 2022-12-14 DIAGNOSIS — K8020 Calculus of gallbladder without cholecystitis without obstruction: Secondary | ICD-10-CM | POA: Diagnosis not present

## 2022-12-14 DIAGNOSIS — R531 Weakness: Principal | ICD-10-CM | POA: Insufficient documentation

## 2022-12-14 DIAGNOSIS — J811 Chronic pulmonary edema: Secondary | ICD-10-CM | POA: Diagnosis not present

## 2022-12-14 LAB — COMPREHENSIVE METABOLIC PANEL
ALT: 25 U/L (ref 0–44)
AST: 104 U/L — ABNORMAL HIGH (ref 15–41)
Albumin: 2.6 g/dL — ABNORMAL LOW (ref 3.5–5.0)
Alkaline Phosphatase: 75 U/L (ref 38–126)
Anion gap: 13 (ref 5–15)
BUN: 62 mg/dL — ABNORMAL HIGH (ref 8–23)
CO2: 21 mmol/L — ABNORMAL LOW (ref 22–32)
Calcium: 8.7 mg/dL — ABNORMAL LOW (ref 8.9–10.3)
Chloride: 98 mmol/L (ref 98–111)
Creatinine, Ser: 1.65 mg/dL — ABNORMAL HIGH (ref 0.44–1.00)
GFR, Estimated: 33 mL/min — ABNORMAL LOW (ref >60)
Glucose, Bld: 149 mg/dL — ABNORMAL HIGH (ref 70–99)
Potassium: 4.9 mmol/L (ref 3.5–5.1)
Sodium: 132 mmol/L — ABNORMAL LOW (ref 135–145)
Total Bilirubin: 0.8 mg/dL (ref 0.3–1.2)
Total Protein: 6 g/dL — ABNORMAL LOW (ref 6.5–8.1)

## 2022-12-14 LAB — URINALYSIS, W/ REFLEX TO CULTURE (INFECTION SUSPECTED)
Bacteria, UA: NONE SEEN
Bilirubin Urine: NEGATIVE
Glucose, UA: NEGATIVE mg/dL
Hgb urine dipstick: NEGATIVE
Ketones, ur: NEGATIVE mg/dL
Leukocytes,Ua: NEGATIVE
Nitrite: NEGATIVE
Protein, ur: NEGATIVE mg/dL
Specific Gravity, Urine: 1.019 (ref 1.005–1.030)
pH: 5 (ref 5.0–8.0)

## 2022-12-14 LAB — CBC
HCT: 32.7 % — ABNORMAL LOW (ref 36.0–46.0)
Hemoglobin: 10.6 g/dL — ABNORMAL LOW (ref 12.0–15.0)
MCH: 30.2 pg (ref 26.0–34.0)
MCHC: 32.4 g/dL (ref 30.0–36.0)
MCV: 93.2 fL (ref 80.0–100.0)
Platelets: 362 10*3/uL (ref 150–400)
RBC: 3.51 MIL/uL — ABNORMAL LOW (ref 3.87–5.11)
RDW: 17.7 % — ABNORMAL HIGH (ref 11.5–15.5)
WBC: 20.1 10*3/uL — ABNORMAL HIGH (ref 4.0–10.5)
nRBC: 0 % (ref 0.0–0.2)

## 2022-12-14 LAB — LACTIC ACID, PLASMA
Lactic Acid, Venous: 4.5 mmol/L (ref 0.5–1.9)
Lactic Acid, Venous: 5 mmol/L (ref 0.5–1.9)
Lactic Acid, Venous: 2.6 mmol/L (ref 0.5–1.9)
Lactic Acid, Venous: 2.4 mmol/L (ref 0.5–1.9)

## 2022-12-14 LAB — CBG MONITORING, ED: Glucose-Capillary: 146 mg/dL — ABNORMAL HIGH (ref 70–99)

## 2022-12-14 LAB — LIPASE, BLOOD: Lipase: 38 U/L (ref 11–51)

## 2022-12-14 LAB — TSH: TSH: 5.538 u[IU]/mL — ABNORMAL HIGH (ref 0.350–4.500)

## 2022-12-14 MED ORDER — GLYCOPYRROLATE 0.2 MG/ML IJ SOLN: 0.2000 mg | INTRAMUSCULAR | Status: DC | PRN

## 2022-12-14 MED ORDER — VANCOMYCIN HCL IN DEXTROSE 1-5 GM/200ML-% IV SOLN: 1000.0000 mg | Freq: Once | INTRAVENOUS | Status: DC

## 2022-12-14 MED ORDER — LORAZEPAM 2 MG/ML PO CONC
1.0000 mg | ORAL | Status: DC | PRN
  Filled 2022-12-14: qty 0.5

## 2022-12-14 MED ORDER — BIOTENE DRY MOUTH MT LIQD: 15.0000 mL | OROMUCOSAL | Status: DC | PRN

## 2022-12-14 MED ORDER — IOHEXOL 300 MG/ML  SOLN
100.0000 mL | Freq: Once | INTRAMUSCULAR | Status: AC | PRN
  Administered 2022-12-14: 80 mL via INTRAVENOUS

## 2022-12-14 MED ORDER — PROCHLORPERAZINE EDISYLATE 10 MG/2ML IJ SOLN: 10.0000 mg | Freq: Two times a day (BID) | INTRAMUSCULAR | Status: DC | PRN

## 2022-12-14 MED ORDER — GLYCOPYRROLATE 1 MG PO TABS: 1.0000 mg | ORAL_TABLET | ORAL | Status: DC | PRN

## 2022-12-14 MED ORDER — PROCHLORPERAZINE MALEATE 5 MG PO TABS: 5.0000 mg | ORAL_TABLET | Freq: Four times a day (QID) | ORAL | Status: DC | PRN

## 2022-12-14 MED ORDER — PROCHLORPERAZINE 25 MG RE SUPP: 25.0000 mg | Freq: Two times a day (BID) | RECTAL | Status: DC | PRN

## 2022-12-14 MED ORDER — LACTATED RINGERS IV BOLUS (SEPSIS)
500.0000 mL | Freq: Once | INTRAVENOUS | Status: AC
  Administered 2022-12-14: 500 mL via INTRAVENOUS

## 2022-12-14 MED ORDER — LORAZEPAM 1 MG PO TABS: 1.0000 mg | ORAL_TABLET | ORAL | Status: DC | PRN

## 2022-12-14 MED ORDER — VANCOMYCIN HCL 1500 MG/300ML IV SOLN
1500.0000 mg | Freq: Once | INTRAVENOUS | Status: AC
  Administered 2022-12-14: 1500 mg via INTRAVENOUS
  Filled 2022-12-14 (×2): qty 300

## 2022-12-14 MED ORDER — HYDROMORPHONE HCL 1 MG/ML IJ SOLN
0.5000 mg | INTRAMUSCULAR | Status: DC | PRN
  Administered 2022-12-15: 0.5 mg via INTRAVENOUS
  Filled 2022-12-14: qty 0.5

## 2022-12-14 MED ORDER — SODIUM CHLORIDE 0.9 % IV SOLN
2.0000 g | Freq: Once | INTRAVENOUS | Status: AC
  Administered 2022-12-14: 2 g via INTRAVENOUS
  Filled 2022-12-14: qty 12.5

## 2022-12-14 MED ORDER — LACTATED RINGERS IV BOLUS (SEPSIS)
1000.0000 mL | Freq: Once | INTRAVENOUS | Status: AC
  Administered 2022-12-14: 1000 mL via INTRAVENOUS

## 2022-12-14 MED ORDER — METRONIDAZOLE 500 MG/100ML IV SOLN
500.0000 mg | Freq: Once | INTRAVENOUS | Status: AC
  Administered 2022-12-14: 500 mg via INTRAVENOUS
  Filled 2022-12-14: qty 100

## 2022-12-14 NOTE — ED Provider Triage Note (Signed)
Emergency Medicine Provider Triage Evaluation Note  Asyia Cihlar , a 72 y.o. female  was evaluated in triage.  Pt complains of weakness/fatigue. Global weakness.   W brother who provides most of hx.   Seems in August she began having vision problems.  In July  of last year she'd had a hysterectomy for uterine cancer.   Seems she's had some abd distention over and uncertain amount of time.    There may have been spread of uterine cancer prior to hysterectomy but she's refused care/additional workup since then. Refused colonoscopy as well.   Review of Systems  Positive: Abd pain and distention.  Negative: Fever   Physical Exam  BP (!) 112/56   Pulse (!) 112   Temp 98.4 F (36.9 C) (Oral)   Resp 18   Ht 5' (1.524 m)   Wt 83.2 kg   SpO2 100%   BMI 35.82 kg/m  Gen:   Awake, no distress   Resp:  Normal effort  MSK:   Moves extremities without difficulty  Other:  Chronically unwell appearing woman. Distended tender abd. Tachycardia. Sensation and movement normal. No visual field deficits with gross quadrant exam.   Oriented to self ,   Medical Decision Making  Medically screening exam initiated at 12:19 PM.  Appropriate orders placed.  Ryneisha Debski was informed that the remainder of the evaluation will be completed by another provider, this initial triage assessment does not replace that evaluation, and the importance of remaining in the ED until their evaluation is complete.  CT head and CT abd/pel w contrast Labs   Pati Gallo [place], [place] 12/14/22 1227

## 2022-12-14 NOTE — Care Management (Addendum)
Transition of Care [facility]) - Emergency Department Mini Assessment   Patient Details  Name: Cynthia Hill MRN: [id number] Date of Birth: [birth year]  Transition of Care [facility] CM/SW Contact:    Roseanne Kaufman, RN Phone Number: 12/14/2022, 6:40 PM   Clinical Narrative: Cynthia Hill consult for inpt hospice services. This RNCM spoke with patient at bedside, her brother is no longer at bedside. Patient reports she wants to go somewhere that she can sit up " I'm tired of laying down". Patient provided verbal consent to speak with her brother and sister.  This attempted to reach patient's brother and did not receive an answer.  While this RNCM was speaking with patient has been accepted for inpatient admission.    TOC will continue to follow.    ED Mini Assessment: What brought you to the Emergency Department? : increased weakness and confusion  Barriers to Discharge: Continued Medical Work up  [employer] interventions: hospice consult     Interventions which prevented an admission or readmission: Hospice    Patient Contact and Communications        ,          Patient states their goals for this hospitalization and ongoing recovery are:: want to be able to sit up CMS Medicare.gov Compare Post Acute Care list provided to:: Patient Choice offered to / list presented to : Patient  Admission diagnosis:  Endometrial cancer, FIGO stage IVB ([place]) [C54.1] Patient Active Problem List   Diagnosis Date Noted   Endometrial cancer, FIGO stage IVB ([place]) 12/14/2022   Severe sepsis ([place]) 12/14/2022   Acute kidney injury ([place]) 12/14/2022   Metastatic malignant neoplasm ([place]) 12/14/2022   Leukocytosis 12/14/2022   Endometrial cancer ([place]) 01/14/2022   Memory loss 01/14/2022   Obesity, Class II, BMI 35-39.9 01/14/2022   Hypothyroidism    Pericardial effusion 12/18/2020   Uterine mass 12/18/2020   Symptomatic anemia 0000000   Acute diastolic CHF (congestive heart failure) ([place])  12/12/2020   HTN (hypertension) 12/12/2020   Hypokalemia 12/12/2020   PCP:  Lyman Bishop, DO Pharmacy:   [facility] [place] [facility] [address] [place] [address] Phone: [phone] Fax: [phone]  [facility] [address] [place] [address] Phone: [phone] Fax: [phone]

## 2022-12-14 NOTE — Sepsis Progress Note (Signed)
Elink monitoring for the code sepsis protocol.  

## 2022-12-14 NOTE — Sepsis Progress Note (Signed)
Notified provider and bedside nurse of need to order and draw repeat lactic acid #3.  

## 2022-12-14 NOTE — Sepsis Progress Note (Signed)
Notified provider and bedside nurse of need to order and administer fluid bolus, pt needs 2496 cc.

## 2022-12-14 NOTE — ED Triage Notes (Signed)
Pt presents with c/o weakness for several weeks. Family at bedside report that she has not been eating well and that she seems more confused than normal.

## 2022-12-14 NOTE — ED Notes (Signed)
Pt off the floor, getting scanned

## 2022-12-14 NOTE — ED Provider Notes (Signed)
[place] EMERGENCY DEPARTMENT AT [facility] Provider Note   CSN: [id number] Arrival date & time: 12/14/22  1208     History  Chief Complaint  Patient presents with   Weakness    Jaycie Uzzo is a 72 y.o. female.  HPI 72 year old female presents with weakness.  History is primarily from the brother at the bedside.  Patient has a history of endometrial cancer with prior salpingo-oophorectomy.  Brother reports that since that surgery in June 2023 she has had progressive decline.  She declined any other interventions at the time.  She has been having progressive memory issues and weakness.  However everything is a lot worse over the last 1 week or so.  She has been more weak, not eating and drinking well, and losing weight.  Her abdomen seems more distended to the brother.  No fevers or vomiting though she burps a lot.  Today she was unable to get up on her own and this has been progressively worsening for days so she was brought to the ER. Seems more confused today, has been more progressive over the past week.  Home Medications Prior to Admission medications   Medication Sig Start Date End Date Taking? Authorizing Provider  amLODipine (NORVASC) 5 MG tablet Take 1 tablet (5 mg total) by mouth daily. 04/18/22   Janina Mayo, MD  ferrous sulfate 325 (65 FE) MG EC tablet Take 1 tablet (325 mg total) by mouth daily with breakfast. 11/24/21 02/16/25  Timothy Lasso, MD  furosemide (LASIX) 40 MG tablet Take 1 tablet (40 mg total) by mouth daily. 11/25/21 02/17/24  Timothy Lasso, MD  levothyroxine (SYNTHROID) 100 MCG tablet Take 100 mcg by mouth daily before breakfast. 12/20/21   [provider]  losartan (COZAAR) 25 MG tablet Take 1 tablet (25 mg total) by mouth in the morning. 04/18/22 05/18/22  Janina Mayo, MD  potassium chloride (KLOR-CON) 10 MEQ tablet Take 20 mEq by mouth in the morning. 12/29/21   [provider]      Allergies    Aspirin, Sulfa  antibiotics, Albuterol, Atenolol, Bactroban [mupirocin], Benzocaine, Bumex [bumetanide], Levaquin [levofloxacin], Morphine and related, Other, Propranolol, Ptu [propylthiouracil], Spironolactone, Tapazole [methimazole], Tetracyclines & related, Toprol xl [metoprolol], Triamterene, and Valium [diazepam]    Review of Systems   Review of Systems  Unable to perform ROS: Mental status change    Physical Exam Updated Vital Signs BP (!) 118/55   Pulse (!) 109   Temp 98.9 F (37.2 C) (Oral)   Resp (!) 28   Ht 5' (1.524 m)   Wt 83.2 kg   SpO2 98%   BMI 35.82 kg/m  Physical Exam Vitals and nursing note reviewed.  Constitutional:      Appearance: She is well-developed. She is not diaphoretic.  HENT:     Head: Normocephalic and atraumatic.  Cardiovascular:     Rate and Rhythm: Regular rhythm. Tachycardia present.     Heart sounds: Normal heart sounds.  Pulmonary:     Effort: Pulmonary effort is normal.     Breath sounds: Normal breath sounds.  Abdominal:     General: There is distension.     Tenderness: There is abdominal tenderness.  Skin:    General: Skin is warm and dry.  Neurological:     Mental Status: She is alert. She is disoriented.     Comments: No slurred speech. Disoriented. 5/5 strength in both upper extremities, 4/5 in both lower.      ED Results /  Procedures / Treatments   Labs (all labs ordered are listed, but only abnormal results are displayed) Labs Reviewed  COMPREHENSIVE METABOLIC PANEL - Abnormal; Notable for the following components:      Result Value   Sodium 132 (*)    CO2 21 (*)    Glucose, Bld 149 (*)    BUN 62 (*)    Creatinine, Ser 1.65 (*)    Calcium 8.7 (*)    Total Protein 6.0 (*)    Albumin 2.6 (*)    AST 104 (*)    GFR, Estimated 33 (*)    All other components within normal limits  CBC - Abnormal; Notable for the following components:   WBC 20.1 (*)    RBC 3.51 (*)    Hemoglobin 10.6 (*)    HCT 32.7 (*)    RDW 17.7 (*)    All other  components within normal limits  LACTIC ACID, PLASMA - Abnormal; Notable for the following components:   Lactic Acid, Venous 4.5 (*)    All other components within normal limits  LACTIC ACID, PLASMA - Abnormal; Notable for the following components:   Lactic Acid, Venous 5.0 (*)    All other components within normal limits  CBG MONITORING, ED - Abnormal; Notable for the following components:   Glucose-Capillary 146 (*)    All other components within normal limits  CULTURE, BLOOD (ROUTINE X 2)  CULTURE, BLOOD (ROUTINE X 2)  LIPASE, BLOOD  TSH  URINALYSIS, W/ REFLEX TO CULTURE (INFECTION SUSPECTED)  LACTIC ACID, PLASMA  LACTIC ACID, PLASMA    EKG EKG Interpretation  Date/Time:  Thursday December 14 2022 12:20:40 EDT Ventricular Rate:  109 PR Interval:    QRS Duration: 90 QT Interval:  323 QTC Calculation: 435 R Axis:   -74 Text Interpretation: Sinus tachycardia  otherwise similar to Feb 2023 Confirmed by Sherwood Gambler [id number] on 12/14/2022 1:43:20 PM  Radiology CT ABDOMEN PELVIS W CONTRAST  Result Date: 12/14/2022 CLINICAL DATA:  Abdominal pain, right lower lung nodule on chest radiography EXAM: CT CHEST, ABDOMEN, AND PELVIS WITH CONTRAST TECHNIQUE: Multidetector CT imaging of the chest, abdomen and pelvis was performed following the standard protocol during bolus administration of intravenous contrast. RADIATION DOSE REDUCTION: This exam was performed according to the departmental dose-optimization program which includes automated exposure control, adjustment of the mA and/or kV according to patient size and/or use of iterative reconstruction technique. CONTRAST:  79m OMNIPAQUE IOHEXOL 300 MG/ML  SOLN COMPARISON:  03/06/2022 FINDINGS: CT CHEST FINDINGS Cardiovascular: SVC patent. Heart size normal. Trace pericardial fluid. The RV is nondilated. Coronary calcifications. No thoracic aortic aneurysm. Mediastinum/Nodes: Enlarged 1.5 cm left AP window lymph node. 1.2 cm paraesophageal node  just below the level of the carina. Lungs/Pleura: No pleural effusion. No pneumothorax. 1.5 cm subpleural nodule, lateral basal segment right lower lobe. 1 cm nodule centrally at the left lung base (Im74,Se5) . additional subcentimeter nodules in the superior segment right lower lobe, medial left upper lobe, and anterior right upper lobe. Musculoskeletal: No chest wall mass or suspicious bone lesions identified. CT ABDOMEN PELVIS FINDINGS Hepatobiliary: Nodular hepatic contour. Hypoechoic regions adjacent to the gallbladder fossa and falciform ligament possibly regions of focal fatty infiltration. Portal vein patent. 8 mm calcified gallstone in the central cystic duct. No intrahepatic biliary ductal dilatation. Pancreas: Unremarkable. No pancreatic ductal dilatation or surrounding inflammatory changes. Spleen: Mild splenomegaly 13 cm greatest length.  No focal lesion. Adrenals/Urinary Tract: No adrenal nodule. Symmetric renal enhancement without focal lesion. 3  mm calculus in the lower pole left renal collecting system. No hydronephrosis. Urinary bladder physiologically distended. Stomach/Bowel: Stomach is incompletely distended, unremarkable. Small bowel is nondilated. Some areas of mild wall thickening or suspected and proximal small bowel and the left mid abdomen, without significant adjacent inflammatory/edematous change. Normal appendix. Gas and fecal material partially distend the colon, without acute finding. Vascular/Lymphatic: Moderate calcified aortoiliac atheromatous plaque without aneurysm. Portal vein is patent. Large left retroperitoneal splenorenal venous shunt. There is also a large right mesenteric venous shunt to the Right adrenal vein. no definite gastric or esophageal varices. Reproductive: Status post hysterectomy. No adnexal masses. Other: Multiple peritoneal masses with heavy anterior omental masslike caking, new since 03/06/2022. Small volume abdominal ascites. No free air. Musculoskeletal:  Bilateral sacroiliitis. No fracture or worrisome bone lesion. IMPRESSION: 1. Pulmonary nodules, mediastinal adenopathy, peritoneal masses, and omental caking consistent with metastatic disease. 2. Hepatic cirrhosis with splenomegaly, splenorenal and mesenteric venous collaterals, and small volume ascites. 3. Cholelithiasis. 4. Left nephrolithiasis. Electronically Signed   By: Lucrezia Europe M.D.   On: 12/14/2022 16:00   CT Chest W Contrast  Result Date: 12/14/2022 CLINICAL DATA:  Abdominal pain, right lower lung nodule on chest radiography EXAM: CT CHEST, ABDOMEN, AND PELVIS WITH CONTRAST TECHNIQUE: Multidetector CT imaging of the chest, abdomen and pelvis was performed following the standard protocol during bolus administration of intravenous contrast. RADIATION DOSE REDUCTION: This exam was performed according to the departmental dose-optimization program which includes automated exposure control, adjustment of the mA and/or kV according to patient size and/or use of iterative reconstruction technique. CONTRAST:  62m OMNIPAQUE IOHEXOL 300 MG/ML  SOLN COMPARISON:  03/06/2022 FINDINGS: CT CHEST FINDINGS Cardiovascular: SVC patent. Heart size normal. Trace pericardial fluid. The RV is nondilated. Coronary calcifications. No thoracic aortic aneurysm. Mediastinum/Nodes: Enlarged 1.5 cm left AP window lymph node. 1.2 cm paraesophageal node just below the level of the carina. Lungs/Pleura: No pleural effusion. No pneumothorax. 1.5 cm subpleural nodule, lateral basal segment right lower lobe. 1 cm nodule centrally at the left lung base (Im74,Se5) . additional subcentimeter nodules in the superior segment right lower lobe, medial left upper lobe, and anterior right upper lobe. Musculoskeletal: No chest wall mass or suspicious bone lesions identified. CT ABDOMEN PELVIS FINDINGS Hepatobiliary: Nodular hepatic contour. Hypoechoic regions adjacent to the gallbladder fossa and falciform ligament possibly regions of focal fatty  infiltration. Portal vein patent. 8 mm calcified gallstone in the central cystic duct. No intrahepatic biliary ductal dilatation. Pancreas: Unremarkable. No pancreatic ductal dilatation or surrounding inflammatory changes. Spleen: Mild splenomegaly 13 cm greatest length.  No focal lesion. Adrenals/Urinary Tract: No adrenal nodule. Symmetric renal enhancement without focal lesion. 3 mm calculus in the lower pole left renal collecting system. No hydronephrosis. Urinary bladder physiologically distended. Stomach/Bowel: Stomach is incompletely distended, unremarkable. Small bowel is nondilated. Some areas of mild wall thickening or suspected and proximal small bowel and the left mid abdomen, without significant adjacent inflammatory/edematous change. Normal appendix. Gas and fecal material partially distend the colon, without acute finding. Vascular/Lymphatic: Moderate calcified aortoiliac atheromatous plaque without aneurysm. Portal vein is patent. Large left retroperitoneal splenorenal venous shunt. There is also a large right mesenteric venous shunt to the Right adrenal vein. no definite gastric or esophageal varices. Reproductive: Status post hysterectomy. No adnexal masses. Other: Multiple peritoneal masses with heavy anterior omental masslike caking, new since 03/06/2022. Small volume abdominal ascites. No free air. Musculoskeletal: Bilateral sacroiliitis. No fracture or worrisome bone lesion. IMPRESSION: 1. Pulmonary nodules, mediastinal adenopathy, peritoneal masses, and  omental caking consistent with metastatic disease. 2. Hepatic cirrhosis with splenomegaly, splenorenal and mesenteric venous collaterals, and small volume ascites. 3. Cholelithiasis. 4. Left nephrolithiasis. Electronically Signed   By: Lucrezia Europe M.D.   On: 12/14/2022 16:00   CT HEAD WO CONTRAST (5MM)  Result Date: 12/14/2022 CLINICAL DATA:  Mental status change EXAM: CT HEAD WITHOUT CONTRAST TECHNIQUE: Contiguous axial images were obtained  from the base of the skull through the vertex without intravenous contrast. RADIATION DOSE REDUCTION: This exam was performed according to the departmental dose-optimization program which includes automated exposure control, adjustment of the mA and/or kV according to patient size and/or use of iterative reconstruction technique. COMPARISON:  None Available. FINDINGS: Brain: No evidence of acute infarction, hemorrhage, hydrocephalus, extra-axial collection or mass lesion/mass effect. There is a encephalomalacia in the right occipital lobe likely related to old infarct. There is mild periventricular white matter hypodensity, likely chronic small vessel ischemic change. Vascular: Atherosclerotic calcifications are present within the cavernous internal carotid arteries. Skull: Normal. Negative for fracture or focal lesion. Sinuses/Orbits: No acute finding. Other: Subcutaneous scalp nodule with calcifications posterior to the occiput measures 14 mm, likely sebaceous cyst. IMPRESSION: 1. No acute intracranial process. 2. 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 3. Mild chronic small vessel ischemic change of the white matter. Electronically Signed   By: Ronney Asters M.D.   On: 12/14/2022 15:43   DG Chest 2 View  Result Date: 12/14/2022 CLINICAL DATA:  Altered mental status. EXAM: CHEST - 2 VIEW COMPARISON:  12/12/2020 FINDINGS: Heart size appears normal. Aortic atherosclerosis. Low lung volumes with pulmonary vascular congestion. Central airway thickening. No airspace consolidation. Nodular density within the periphery of the right lower lung is indeterminate measuring 1.1 cm. Not confidently identified on previous imaging. The visualized osseous structures appear intact. IMPRESSION: 1. Low lung volumes and pulmonary vascular congestion. 2. Central airway thickening compatible with bronchitis or reactive airways disease. 3. Indeterminate 1.1 cm nodule within the periphery of the  right lower lung. Recommend further evaluation with nonemergent CT of the chest. Electronically Signed   By: Kerby Moors M.D.   On: 12/14/2022 13:10    Procedures .Critical Care  Performed by: Sherwood Gambler, MD Authorized by: Sherwood Gambler, MD   Critical care provider statement:    Critical care time (minutes):  45   Critical care time was exclusive of:  Separately billable procedures and treating other patients   Critical care was necessary to treat or prevent imminent or life-threatening deterioration of the following conditions:  Sepsis and renal failure   Critical care was time spent personally by me on the following activities:  Development of treatment plan with patient or surrogate, discussions with consultants, evaluation of patient's response to treatment, examination of patient, ordering and review of laboratory studies, ordering and review of radiographic studies, ordering and performing treatments and interventions, pulse oximetry, re-evaluation of patient's condition and review of old charts     Medications Ordered in ED Medications  vancomycin (VANCOREADY) IVPB 1500 mg/300 mL (has no administration in time range)  lactated ringers bolus 1,000 mL (1,000 mLs Intravenous New Bag/Given 12/14/22 1415)    And  lactated ringers bolus 500 mL (500 mLs Intravenous New Bag/Given 12/14/22 1445)  ceFEPIme (MAXIPIME) 2 g in sodium chloride 0.9 % 100 mL IVPB (0 g Intravenous Stopped 12/14/22 1445)  metroNIDAZOLE (FLAGYL) IVPB 500 mg (0 mg Intravenous Stopped 12/14/22 1630)  iohexol (OMNIPAQUE) 300 MG/ML solution 100 mL (80 mLs Intravenous Contrast  Given 12/14/22 1511)    ED Course/ Medical Decision Making/ A&P                             Medical Decision Making Amount and/or Complexity of Data Reviewed Labs: ordered.    Details: Acute kidney injury and significant lactic acidosis over 4 x 2.  Leukocytosis. Radiology: ordered.    Details: Metastatic disease. ECG/medicine tests:  ordered and independent interpretation performed.    Details: Sinus tachycardia.  Risk Prescription drug management. Decision regarding hospitalization.   Patient presents with progressive weakness.  Acute on chronic over this last week but overall pretty steady for months.  Seems like she refused medical treatment for cancer back in June.  Now it seems like most of her symptoms are probably cancer related though could not rule out sepsis as the cause of her lactate, leukocytosis, etc.  Otherwise, I did do a quick bedside ultrasound and there is a little bit of ascites but is so small I do not think it is amenable to a paracentesis.  She was given broad IV antibiotics and fluids.  Multiple times talking to brother and patient, she would not want cancer treatment and may not want aggressive treatment otherwise.  For now discussed admission for supportive care and discussing results further.  She is a DO NOT RESUSCITATE after discussion with her and brother.  Discussed with Dr. Verlon Au for admission.        Final Clinical Impression(s) / ED Diagnoses Final diagnoses:  Severe sepsis ([place])  Acute kidney injury ([place])  Metastatic malignant neoplasm, unspecified site [facility])    Rx / DC Orders ED Discharge Orders     None         Sherwood Gambler, MD 12/14/22 1644

## 2022-12-14 NOTE — H&P (Addendum)
HPI  Cynthia Hill [id number] DOB: [birth year] DOA: 12/14/2022  PCP: Lyman Bishop, DO   Chief Complaint:   Weak, confused  HPI:  72 year old white female Known prior anemia and admission for the same 11/2020 with severe iron deficiency from fibroid uterus [id number] status posttransfusion HFpEF diagnosed at the time with acute diastolic dysfunction and moderate pericardial effusion HTN Hypothyroid She was diagnosed with at least a stage III AA endometrial CA underwent debulking surgery in addition to sentinel node injection and declined any further chemotherapy-notes detail clearly valiant attempts from [facility] oncology going up with the patient to ensure that she understood what was going on-- and patient appears to have been lost to follow-up completely from  gynecology oncology perspective  Came to emergency room from home with weakness for several weeks and increasing confusion according to family--patient had been walking up to about 3 to 4 days ago but progressed to become weaker -she had been using a cane but then started to become confused on the morning of 3/14 could not be really aroused well and was talking nonsensically according to the patient's brother who is at the bedside  He last saw her about 3 days ago, usually there is a care-giver present for some time during the day  I sat down with the patient and had an extensive discussion with her and she tells me that she "has been ready to die for the last year"  I spoke with the patient's Sister Cynthia Hill on the telephone who is HCPOA who lives in [place] understands that the patient is clearly declining and although she seems surprised at the rapidity of the decline, she does reaffirmed that the patient is a no CODE BLUE and would not want aggressive resuscitation  Patient herself moved here from [place] about 3 years ago shares her brother when she was living there she lives right next door to [name] the  sister and power of attorney  I gave space for reflective listening in addition to discussion amongst patient's siblings so that they could formulate their own discussion and opinions of the care plan  Review of Systems:   + Abdominal pain + confusion + anorexia - Fever, - chills, - dark stool, - tarry stool    Past Medical History:  Diagnosis Date   Anemia    CHF (congestive heart failure) ([place])    Chronic fatigue    Chronic pain    Fibroids    HTN (hypertension)    Hypothyroidism    Migraine    Mononucleosis    Past Surgical History:  Procedure Laterality Date   CYSTOSCOPY N/A 03/07/2022   Procedure: CYSTOSCOPY;  Surgeon: Lafonda Mosses, MD;  Location: [facility];  Service: Gynecology;  Laterality: N/A;   ROBOTIC ASSISTED TOTAL HYSTERECTOMY WITH BILATERAL SALPINGO OOPHERECTOMY Bilateral 03/07/2022   Procedure: XI ROBOTIC ASSISTED TOTAL HYSTERECTOMY WITH BILATERAL SALPINGO OOPHORECTOMY, VAGINAL LACERATION REPAIR;  Surgeon: Lafonda Mosses, MD;  Location: [facility];  Service: Gynecology;  Laterality: Bilateral;   TONSILLECTOMY      reports that she has never smoked. She has never used smokeless tobacco. She reports that she does not drink alcohol and does not use drugs.  Mobility: Up until about 3 to 4 days ago she was ambulatory but she has been so weak today that she could not get up  Allergies  Allergen Reactions   Aspirin Other (See Comments)    unknown   Sulfa Antibiotics Other (See Comments)    unknown  Albuterol Other (See Comments)    Worsens asthma   Atenolol Other (See Comments)    unknown   Bactroban [Mupirocin] Swelling   Benzocaine Other (See Comments)    Throat swelling   Bumex [Bumetanide] Other (See Comments)    Throat swelling   Levaquin [Levofloxacin] Other (See Comments)    unknown   Morphine And Related Other (See Comments)    unknown   Other Other (See Comments)    Sea food, shell fish Patient isn't sure of reaction   Propranolol Other (See  Comments)    unknown   Ptu [Propylthiouracil] Other (See Comments)    Doubles heart rate   Spironolactone Other (See Comments)    Throat swelling   Tapazole [Methimazole] Other (See Comments)    Irregular heartbeat   Tetracyclines & Related Other (See Comments)    Throat swelling   Toprol Xl [Metoprolol] Other (See Comments)    unknown   Triamterene Other (See Comments)    unknown   Valium [Diazepam] Other (See Comments)    unknown   Family History  Problem Relation Age of Onset   Heart disease Mother    Blindness Mother    Heart disease Father    Prostate cancer Father    Stomach cancer Paternal Aunt    Endometrial cancer Neg Hx    Ovarian cancer Neg Hx    Pancreatic cancer Neg Hx    Colon cancer Neg Hx    Breast cancer Neg Hx    Prior to Admission medications   Medication Sig Start Date End Date Taking? Authorizing Provider  amLODipine (NORVASC) 5 MG tablet Take 1 tablet (5 mg total) by mouth daily. 04/18/22   Janina Mayo, MD  ferrous sulfate 325 (65 FE) MG EC tablet Take 1 tablet (325 mg total) by mouth daily with breakfast. 11/24/21 02/16/25  Timothy Lasso, MD  furosemide (LASIX) 40 MG tablet Take 1 tablet (40 mg total) by mouth daily. 11/25/21 02/17/24  Timothy Lasso, MD  levothyroxine (SYNTHROID) 100 MCG tablet Take 100 mcg by mouth daily before breakfast. 12/20/21   [provider]  losartan (COZAAR) 25 MG tablet Take 1 tablet (25 mg total) by mouth in the morning. 04/18/22 05/18/22  Janina Mayo, MD  potassium chloride (KLOR-CON) 10 MEQ tablet Take 20 mEq by mouth in the morning. 12/29/21   [provider]    Physical Exam:  Vitals:   12/14/22 1400 12/14/22 1600  BP: (!) 116/55 (!) 118/52  Pulse: (!) 110 (!) 104  Resp: 20 (!) 24  Temp:    SpO2: 100% 97%    frail cachectic white lady laying on ED bed Bitemporal  supraclavicular wasting poor dentition Chest is clear patient is tachycardic into the 110 range cannot appreciate  murmur Abdomen is severely distended with quite tender areas on slight palpation I did not examine or probe deeply No lower extremity edema No rash Psych is  I have personally reviewed following labs and imaging studies  Lactic acid 4.5, CO2 21 WBC 20 platelet 362 BUNs/creatinine 62/1.6 with GFR of 33 (last labs 03/08/2022 BUNs/creatinine 25/1.2, GFR 48) AST/ALT 104/25 total protein 6.0 bilirubin 0.8   CXR 1.1 cm right lower lung nodule central airway thickening compatible with airway disease CT revealed mediastinal adenopathy peritoneal masses omental caking consistent with metastatic disease Hepatic cirrhosis with splenomegaly splenorenal and mesenteric venous collaterals and small volume ascites  Active Problems:   * No active hospital problems. *   Assessment/Plan  Malignant ascites  in a setting of terminal endometrial cancer AKI secondary to ascites physiology Mild metabolic encephalopathy secondary to underlying disease state Lactic acidosis on admission probably from cancer versus infection unclear which   Patient will be admitted to the hospital to assist with disposition planning as patient is quite clear about end-of-life care and I have had extensive discussions as per HPI with the patient's HCPOA Cynthia Hill as well as with the patient's brother at the bedside We will start slowly with as needed Dilaudid and as needed Ativan to allow patient's sister to come from [place] to visit with the patient and with the patient's family. I have asked that case manager/TOC start working now on a disposition plan with either their freestanding hospice or home hospice as patient is clearly hospice eligible    Severity of Illness: The appropriate patient status for this patient is INPATIENT. Inpatient status is judged to be reasonable and necessary in order to provide the required intensity of service to ensure the patient's safety. The patient's presenting symptoms, physical exam  findings, and initial radiographic and laboratory data in the context of their chronic comorbidities is felt to place them at high risk for further clinical deterioration. Furthermore, it is not anticipated that the patient will be medically stable for discharge from the hospital within 2 midnights of admission.   * I certify that at the point of admission it is my clinical judgment that the patient will require inpatient hospital care spanning beyond 2 midnights from the point of admission due to high intensity of service, high risk for further deterioration and high frequency of surveillance required.*   DVT prophylaxis:none Code Status: DNR Family Communication: multiple Consults called:   Gynecological oncology as well as palliative care  Time spent:  57 minutes care coordination time  Verlon Au, MD [days-call my NP partners at night for Care related issues] [facility] --[facility] [employer] [facility] , [email or web address]; password [facility]  12/14/2022, 4:34 PM

## 2022-12-14 NOTE — Progress Notes (Signed)
A consult was received from an ED physician for Cefepime and Vancomycin per pharmacy dosing.  The patient's profile has been reviewed for ht/wt/allergies/indication/available labs.  A one time order has been placed for Cefepime 2g, Vancomycin '1500mg'$ .    Further antibiotics/pharmacy consults should be ordered by admitting physician if indicated.                       Thank you,  Gretta Arab PharmD, BCPS [facility] [phone] 12/14/2022 2:11 PM

## 2022-12-14 NOTE — Hospital Course (Addendum)
24 7 they usually have

## 2022-12-14 NOTE — Consult Note (Signed)
Gynecologic Oncology Consultation  Cynthia Hill 72 y.o. female  CC:  Chief Complaint  Patient presents with   Weakness    HPI: Cynthia Hill is a 72 year old female who presented to the [facility] ER on December 14, 2022 with symptoms of weakness, increased confusion, and decreased appetite. ER labs included Na+ 132, creatinine 1.65, albumin 2.6, AST 104, WBC 20.1, Hgb 10.6, Hct 32.7,lactic acid 4.5 (repeat at 14:28 pm at 5.0). Blood cultures obtained. Imaging obtained in the ER: -Chest 2 view: 1. Low lung volumes and pulmonary vascular congestion. 2. Central airway thickening compatible with bronchitis or reactive airways disease.3. Indeterminate 1.1 cm nodule within the periphery of the right lower lung. Recommend further evaluation with nonemergent CT of the chest.  -CT CAP with contrast:1. Pulmonary nodules, mediastinal adenopathy, peritoneal masses, and omental caking consistent with metastatic disease. 2. Hepatic cirrhosis with splenomegaly, splenorenal and mesenteric venous collaterals, and small volume ascites. 3. Cholelithiasis. 4. Left nephrolithiasis.  -CT head WO contrast: 1. No acute intracranial process. 2. 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 3. Mild chronic small vessel ischemic change of the white matter.  Her history includes at least stage IIIA grade 1 endometrioid endometrial adenocarcinoma and concern for concurrent colon cancer s/p robotic-assisted laparoscopic total hysterectomy with bilateral salpingo-oophorectomy, SLN injection bilaterally with no mapping to sentinel lymph node, cystoscopy, repair of multiple vaginal lacerations including deep right sidewall laceration and posterior laceration on 03/07/2022 with Dr. Jeral Pinch. On her preoperative CT imaging on 03/06/2022, focal sigmoid colonic wall and surrounding interstitial thickening, most consistent with carcinoma with probable transmural spread was noted along with  abdominal and lower thoracic/retrocrural nodal metastasis which could be from either of the patient's primaries. Uterine metastasis slightly favored, although absence of pelvic sidewall adenopathy is somewhat atypical.   Adjuvant systemic chemotherapy was recommended along with GI evaluation and workup for colonic findings on CT. She voiced concerns about not being "strong enough" for chemotherapy. She was seen by GI, Dr. Lorenso Courier, on 03/28/2022 with a colonoscopy recommended at that time. She reached out via phone to GI office on 04/21/2022 and cancelled colonoscopy along with voicing desire for no further treatment.   Her past medical history includes anemia, HFpEF, pericardial effusion, chronic fatigue and pain, HTN, hypothyroidism, migraines, mononucleosis.  Treatment hx listed below:  Oncology History Overview Note  MMR intact MSS   Endometrial cancer ([place])  01/05/2022 Initial Biopsy   EMB: Grade 1 endometrial adenocarcinoma   01/14/2022 Initial Diagnosis   Endometrial cancer ([place])   03/06/2022 Imaging   CT A/P on 6/5 IMPRESSION: 1. Central uterine mass, most consistent with the clinical history of endometrial carcinoma. 2. Focal sigmoid colonic wall and surrounding interstitial thickening, most consistent with carcinoma with probable transmural spread. 3. Abdominal and lower thoracic/retrocrural nodal metastasis which could be from either of the patient's primaries. Uterine metastasis slightly favored, although absence of pelvic sidewall adenopathy is somewhat atypical. Consider tissue sampling. 4. Cirrhosis and portal venous hypertension. 5. Coronary artery atherosclerosis. Aortic Atherosclerosis (ICD10-I70.0). 6. Aortic valvular calcifications. Consider echocardiography to evaluate for valvular dysfunction. 7. Left nephrolithiasis 8. Cholelithiasis   03/07/2022 Surgery   Robotic-assisted laparoscopic total hysterectomy with bilateral salpingo-oophorectomy, SLN injection  bilaterally with no mapping to sentinel lymph node, cystoscopy, repair of multiple vaginal lacerations including deep right sidewall laceration and posterior laceration  Findings: On EUA, narrow vagina; enlarged mobile uterus. On intra-abdominal entry, upper abdominal survey notable for cirrhotic appearing liver; normal stomach, small bowel and  omentum. Sigmoid tethered to the left sidewall at the pelvic brim. Area that appeared somewhat firm with manipulation along the sigmoid colon (sitting out of the brim, to the right of midline) was noted. Uterus 10-12cm and very bulbous with multiple small subserosal fibroids. Bladder was moderately adherent to the cervix. Multi-cystic lesion on the left ovary. Some vesicular deposits on the left fallopian tube, concerning for metastatic disease versus endosalpingiosis. Otherwise, normal adnexa. Significant retroperitoneal fibrosis suspected to be cause of no ICG mapping in either pelvic basin. No obvious pelvic adenopathy. Given comorbidities and suspected varices (even in the pelvis, patient with significantly dilated vasculature and aberrant vasculature) as well as my prior discussion with the patient, decision made to defer pelvic lymph node assessment.  Cystoscopy revealed intact dome of the bladder, good efflux from bilateral ureteral orifices. Large right vaginal sidewall laceration noted. Posterior laceration also noted. Both required repair and vagina packed to help assure hemostasis.     Pathologic Stage   A. UTERUS, CERVIX, BILATERAL FALLOPIAN TUBES AND OVARIES:  - Endometrioid carcinoma, FIGO grade 1, see comment  - Carcinoma invades for a depth of 0.8 cm where myometrial thickness is  1 cm (80%)  - Carcinoma involves a fibrotic nodule within the left adnexa, most  likely atrophic and fibrotic left ovary  - Benign unremarkable bilateral fallopian tubes and right ovary  - Cervix is not involved  - See oncology table   Procedure: Total hysterectomy  and bilateral salpingo-oophorectomy  Histologic Type: Endometrioid carcinoma  Histologic Grade: FIGO grade 1  Myometrial Invasion:       Depth of Myometrial Invasion (mm): 8 mm       Myometrial Thickness (mm): 10 mm       Percentage of Myometrial Invasion: 80%  Uterine Serosa Involvement: Not identified  Cervical stromal Involvement: Not identified  Extent of involvement of other tissue/organs: Carcinoma involves a  fibrotic nodule within the left adnexa, most likely an atrophic left  ovary  Peritoneal/Ascitic Fluid: Not applicable  Lymphovascular Invasion: Not identified  Regional Lymph Nodes: Not applicable (no lymph nodes submitted or found)     Interval History: Patient states she feels poorly. "I have had enough. Feel like it is my time to go." Does not want to go home. Unable to state city she is currently in but knows she is in the hospital. States she does not remember her home. Denies pain but tender with palpation of the abdomen. States she was feeling fine prior to coming in. Does not remember if she has been throwing up or nauseated at home. Does not remember having bowel movements at home. No vaginal bleeding. States does not remember if she has been voiding at home. Discussion limited.  Review of Systems: See Interval. Limited due to poor historian, memory challenges  Current Meds: Current ordered meds and home meds reviewed  Allergy:  Allergies  Allergen Reactions   Aspirin Other (See Comments)    unknown   Sulfa Antibiotics Other (See Comments)    unknown   Albuterol Other (See Comments)    Worsens asthma   Atenolol Other (See Comments)    unknown   Bactroban [Mupirocin] Swelling   Benzocaine Other (See Comments)    Throat swelling   Bumex [Bumetanide] Other (See Comments)    Throat swelling   Levaquin [Levofloxacin] Other (See Comments)    unknown   Morphine And Related Other (See Comments)    unknown   Other Other (See Comments)    Sea food,  shell  fish Patient isn't sure of reaction   Propranolol Other (See Comments)    unknown   Ptu [Propylthiouracil] Other (See Comments)    Doubles heart rate   Spironolactone Other (See Comments)    Throat swelling   Tapazole [Methimazole] Other (See Comments)    Irregular heartbeat   Tetracyclines & Related Other (See Comments)    Throat swelling   Toprol Xl [Metoprolol] Other (See Comments)    unknown   Triamterene Other (See Comments)    unknown   Valium [Diazepam] Other (See Comments)    unknown    Social Hx:   Social History   Socioeconomic History   Marital status: Single    Spouse name: Not on file   Number of children: Not on file   Years of education: Not on file   Highest education level: Not on file  Occupational History   Not on file  Tobacco Use   Smoking status: Never   Smokeless tobacco: Never  Vaping Use   Vaping Use: Never used  Substance and Sexual Activity   Alcohol use: Never   Drug use: Never   Sexual activity: Not Currently  Other Topics Concern   Not on file  Social History Narrative   Not on file   Social Determinants of Health   Financial Resource Strain: Not on file  Food Insecurity: Not on file  Transportation Needs: Not on file  Physical Activity: Not on file  Stress: Not on file  Social Connections: Not on file  Intimate Partner Violence: Not on file    Past Surgical Hx:  Past Surgical History:  Procedure Laterality Date   CYSTOSCOPY N/A 03/07/2022   Procedure: CYSTOSCOPY;  Surgeon: Lafonda Mosses, MD;  Location: [facility];  Service: Gynecology;  Laterality: N/A;   ROBOTIC ASSISTED TOTAL HYSTERECTOMY WITH BILATERAL SALPINGO OOPHERECTOMY Bilateral 03/07/2022   Procedure: XI ROBOTIC ASSISTED TOTAL HYSTERECTOMY WITH BILATERAL SALPINGO OOPHORECTOMY, VAGINAL LACERATION REPAIR;  Surgeon: Lafonda Mosses, MD;  Location: [facility];  Service: Gynecology;  Laterality: Bilateral;   TONSILLECTOMY      Past Medical Hx:  Past Medical History:   Diagnosis Date   Anemia    CHF (congestive heart failure) ([facility])    Chronic fatigue    Chronic pain    Fibroids    HTN (hypertension)    Hypothyroidism    Migraine    Mononucleosis     Family Hx:  Family History  Problem Relation Age of Onset   Heart disease Mother    Blindness Mother    Heart disease Father    Prostate cancer Father    Stomach cancer Paternal Aunt    Endometrial cancer Neg Hx    Ovarian cancer Neg Hx    Pancreatic cancer Neg Hx    Colon cancer Neg Hx    Breast cancer Neg Hx     Vitals:  Blood pressure (!) 118/52, pulse (!) 104, temperature 98.4 F (36.9 C), temperature source Oral, resp. rate (!) 24, height 5' (1.524 m), weight 183 lb 6.4 oz (83.2 kg), SpO2 97 %.  Physical Exam (performed by Dr. Berline Lopes):  Alert, oriented to place being hospital. Unable to answer some questions Tachycardic. Lungs clear. Abdomen tender, fluid wave present, distended  Assessment/Plan: 72 year old female currently admitted with evidence of widely metastatic malignancy on CT imaging. Her history includes at least stage IIIA grade 1 endometrioid endometrial adenocarcinoma and concern for concurrent colon cancer s/p robotic-assisted laparoscopic total hysterectomy with bilateral  salpingo-oophorectomy, SLN injection bilaterally with no mapping to sentinel lymph node, cystoscopy, repair of multiple vaginal lacerations including deep right sidewall laceration and posterior laceration on 03/07/2022 with Dr. Jeral Pinch. Patient declined adjuvant therapy post-operatively along with GI workup. See addition from Dr. Berline Lopes. Recommendation for initiation of Hospice. Continue plan of care per Hospitalist Team.   Dorothyann Gibbs, NP 12/14/2022, 4:34 PM

## 2022-12-15 ENCOUNTER — Other Ambulatory Visit: Payer: Self-pay

## 2022-12-15 DIAGNOSIS — C541 Malignant neoplasm of endometrium: Secondary | ICD-10-CM | POA: Diagnosis not present

## 2022-12-15 DIAGNOSIS — R531 Weakness: Secondary | ICD-10-CM | POA: Diagnosis not present

## 2022-12-15 MED ORDER — HYDROMORPHONE HCL 1 MG/ML PO LIQD: 2.0000 mg | ORAL | 0 refills | Status: DC | PRN

## 2022-12-15 MED ORDER — SCOPOLAMINE 1 MG/3DAYS TD PT72
1.0000 | MEDICATED_PATCH | TRANSDERMAL | Status: DC
  Administered 2022-12-15: 1.5 mg via TRANSDERMAL
  Filled 2022-12-15: qty 1

## 2022-12-15 MED ORDER — LORAZEPAM 2 MG/ML PO CONC: 1.0000 mg | ORAL | 0 refills | Status: DC | PRN

## 2022-12-15 MED ORDER — SCOPOLAMINE 1 MG/3DAYS TD PT72: 1.0000 | MEDICATED_PATCH | TRANSDERMAL | 12 refills | Status: DC

## 2022-12-15 NOTE — Progress Notes (Signed)
Manufacturing engineer [facility]) Hospital Liaison Note  Received request from [facility] [place] for family interest in [facility]. Visited patient at bedside and spoke with brother/Robert to confirm interest and explain services.  Approval for [employer] is determined by [facility] MD. Once [facility] MD has determined [facility] eligibility, Brooks will update hospital staff and family.   Consent forms to be completed.  [facility] notified of patient D/C and transport arranged by TOC for 5:30 pm pick up. Attending Physician/Dr. Verlon Au also notified of transport arrangement.    Please send signed DNR form with patient and RN call report to [phone].    Phillis Haggis, MSW [facility] Liaison [phone]

## 2022-12-15 NOTE — Plan of Care (Signed)
  Problem: Coping: Goal: Level of anxiety will decrease Outcome: Progressing   Problem: Elimination: Goal: Will not experience complications related to bowel motility Outcome: Progressing Goal: Will not experience complications related to urinary retention Outcome: Progressing   

## 2022-12-15 NOTE — TOC Progression Note (Addendum)
Transition of Care [facility]) - Progression Note    Patient Details  Name: Cynthia Hill MRN: [id number] Date of Birth: October 23, 1950  Transition of [facility]) CM/SW Contact  Servando Snare, [place] Phone Number: 12/15/2022, 8:55 AM  Clinical Narrative:   TOC spoke with [name]. Family has chosen [facility]. TOC made hospice referral.    12:05 PM Approved for hospice. Awaiting bed.      Barriers to Discharge: Continued Medical Work up  Expected Discharge Plan and Services                                               Social Determinants of Health (SDOH) Interventions SDOH Screenings   Tobacco Use: Low Risk  (12/14/2022)    Readmission Risk Interventions     No data to display

## 2022-12-15 NOTE — TOC Transition Note (Addendum)
Transition of Care [facility]) - CM/SW Discharge Note   Patient Details  Name: Cynthia Hill MRN: [id number] Date of Birth: 01-06-1951  Transition of [facility]) CM/SW Contact:  Servando Snare, LCSW Phone Number: 12/15/2022, 12:33 PM   Clinical Narrative:   Patient has bed at [facility]. Patient to transfer to [facility] via [place] once consents are complete. Hospice liaison will notify when we can send patient.   TOC placed packet on patient chart.   12:49 PM PTAR arranged for 5:30 pick up.  RN number for report [phone]    Final next level of care: Hospice Medical Facility Barriers to Discharge: No Barriers Identified   Patient Goals and CMS Choice CMS Medicare.gov Compare Post Acute Care list provided to:: Patient Choice offered to / list presented to : NA  Discharge Placement                  Patient to be transferred to facility by: [place] Name of family member notified: [name], Brother Patient and family notified of of transfer: 12/15/22  Discharge Plan and Services Additional resources added to the After Visit Summary for                  DME Arranged: N/A DME Agency: NA       HH Arranged: NA HH Agency: NA        Social Determinants of Health ([place]) Interventions SDOH Screenings   Food Insecurity: Patient Unable To Answer (12/15/2022)  Housing: Low Risk  (12/15/2022)  Transportation Needs: Patient Unable To Answer (12/15/2022)  Utilities: Patient Unable To Answer (12/15/2022)  Tobacco Use: Low Risk  (12/14/2022)     Readmission Risk Interventions     No data to display

## 2022-12-15 NOTE — Discharge Summary (Signed)
Physician Discharge Summary  Cynthia Hill [id number] DOB: 21-Mar-1951 DOA: 12/14/2022  PCP: Lyman Bishop, DO  Admit date: 12/14/2022 Discharge date: 12/15/2022  Time spent: 20 minutes  Recommendations for Outpatient Follow-up:  Patient to discharge to freestanding hospice home  Discharge Diagnoses:  MAIN problem for hospitalization   Malignant ascites in a setting of terminal endometrial cancer AKI secondary to ascites physiology Mild metabolic encephalopathy secondary to underlying disease state Lactic acidosis on admission probably from cancer versus infection unclear which  Please see below for itemized issues addressed in [place]- refer to other progress notes for clarity if needed  Discharge Condition: Guarded  Diet recommendation: Comfort  Filed Weights   12/14/22 1212  Weight: 83.2 kg    History of present illness:  72 year old white female Known prior anemia and admission for the same 11/2020 with severe iron deficiency from fibroid uterus [id number] status posttransfusion HFpEF diagnosed at the time with acute diastolic dysfunction and moderate pericardial effusion HTN Hypothyroid She was diagnosed with at least a stage III AA endometrial CA underwent debulking surgery in addition to sentinel node injection and declined any further chemotherapy-notes detail clearly valiant attempts from [facility] oncology going up with the patient to ensure that she understood what was going on-- and patient appears to have been lost to follow-up completely from  gynecology oncology perspective   Came to emergency room 3/14 from home with weakness for several weeks and increasing confusion according to family--patient had been walking up to about 3 to 4 days ago but progressed to become weaker -she had been using a cane but then started to become confused on the morning of 3/14 could not be really aroused well and was talking nonsensically according to the patient's brother  who is at the bedside   I had a long conference discussion with both HCPOA Evelina Dun in addition to her brother Carolynn Comment who lives locally  It was decided as a conference with all parties including OB/GYN gynecology Dr. Berline Lopes that there were no further options for treatment and that the most prudent decision would be to consider hospice  Ample decision making time was given to family and patient to explore their thoughts about this and patient herself was of the opinion that she wanted to pursue no further aggressive medical treatment  Patient will be discharging to hospice facility when available    Discharge Exam: Vitals:   12/14/22 2041 12/14/22 2058  BP: 132/60 (!) 136/51  Pulse: (!) 110 (!) 107  Resp: 19 16  Temp: 99.3 F (37.4 C) 99.3 F (37.4 C)  SpO2: 99% 97%    Subj on day of d/c   Awake coherent can voice that she wants to see her sister and that as patient is coherent enough it seems like this may be able to occur at her hospice facility  General Exam on discharge  Emaciated Obese abdomen with tense ascites No lower extremity edema No fever  Discharge Instructions   Discharge Instructions     Diet - low sodium heart healthy   Complete by: As directed    Increase activity slowly   Complete by: As directed       Allergies as of 12/15/2022       Reactions   Aspirin Other (See Comments)   unknown   Sulfa Antibiotics Other (See Comments)   unknown   Albuterol Other (See Comments)   Worsens asthma   Atenolol Other (See Comments)   unknown   Bactroban [mupirocin] Swelling  Benzocaine Other (See Comments)   Throat swelling   Bumex [bumetanide] Other (See Comments)   Throat swelling   Levaquin [levofloxacin] Other (See Comments)   unknown   Morphine And Related Other (See Comments)   unknown   Other Other (See Comments)   Sea food, shell fish Patient isn't sure of reaction   Propranolol Other (See Comments)   unknown   Ptu  [propylthiouracil] Other (See Comments)   Doubles heart rate   Spironolactone Other (See Comments)   Throat swelling   Tapazole [methimazole] Other (See Comments)   Irregular heartbeat   Tetracyclines & Related Other (See Comments)   Throat swelling   Toprol Xl [metoprolol] Other (See Comments)   unknown   Triamterene Other (See Comments)   unknown   Valium [diazepam] Other (See Comments)   unknown        Medication List     STOP taking these medications    amLODipine 5 MG tablet Commonly known as: NORVASC   ferrous sulfate 325 (65 FE) MG EC tablet   furosemide 40 MG tablet Commonly known as: LASIX   levothyroxine 100 MCG tablet Commonly known as: SYNTHROID   losartan 25 MG tablet Commonly known as: COZAAR   potassium chloride 10 MEQ tablet Commonly known as: KLOR-CON       TAKE these medications    HYDROmorphone HCl 1 MG/ML Liqd Commonly known as: Dilaudid Take 2 mLs (2 mg total) by mouth every 4 (four) hours as needed for severe pain.   LORazepam 2 MG/ML concentrated solution Commonly known as: ATIVAN Place 0.5 mLs (1 mg total) under the tongue every 4 (four) hours as needed for anxiety.   scopolamine 1 MG/3DAYS Commonly known as: TRANSDERM-SCOP Place 1 patch (1.5 mg total) onto the skin every 3 (three) days.       Allergies  Allergen Reactions   Aspirin Other (See Comments)    unknown   Sulfa Antibiotics Other (See Comments)    unknown   Albuterol Other (See Comments)    Worsens asthma   Atenolol Other (See Comments)    unknown   Bactroban [Mupirocin] Swelling   Benzocaine Other (See Comments)    Throat swelling   Bumex [Bumetanide] Other (See Comments)    Throat swelling   Levaquin [Levofloxacin] Other (See Comments)    unknown   Morphine And Related Other (See Comments)    unknown   Other Other (See Comments)    Sea food, shell fish Patient isn't sure of reaction   Propranolol Other (See Comments)    unknown   Ptu  [Propylthiouracil] Other (See Comments)    Doubles heart rate   Spironolactone Other (See Comments)    Throat swelling   Tapazole [Methimazole] Other (See Comments)    Irregular heartbeat   Tetracyclines & Related Other (See Comments)    Throat swelling   Toprol Xl [Metoprolol] Other (See Comments)    unknown   Triamterene Other (See Comments)    unknown   Valium [Diazepam] Other (See Comments)    unknown      The results of significant diagnostics from this hospitalization (including imaging, microbiology, ancillary and laboratory) are listed below for reference.    Significant Diagnostic Studies: CT ABDOMEN PELVIS W CONTRAST  Result Date: 12/14/2022 CLINICAL DATA:  Abdominal pain, right lower lung nodule on chest radiography EXAM: CT CHEST, ABDOMEN, AND PELVIS WITH CONTRAST TECHNIQUE: Multidetector CT imaging of the chest, abdomen and pelvis was performed following the standard protocol during bolus  administration of intravenous contrast. RADIATION DOSE REDUCTION: This exam was performed according to the departmental dose-optimization program which includes automated exposure control, adjustment of the mA and/or kV according to patient size and/or use of iterative reconstruction technique. CONTRAST:  68mL OMNIPAQUE IOHEXOL 300 MG/ML  SOLN COMPARISON:  03/06/2022 FINDINGS: CT CHEST FINDINGS Cardiovascular: SVC patent. Heart size normal. Trace pericardial fluid. The RV is nondilated. Coronary calcifications. No thoracic aortic aneurysm. Mediastinum/Nodes: Enlarged 1.5 cm left AP window lymph node. 1.2 cm paraesophageal node just below the level of the carina. Lungs/Pleura: No pleural effusion. No pneumothorax. 1.5 cm subpleural nodule, lateral basal segment right lower lobe. 1 cm nodule centrally at the left lung base (Im74,Se5) . additional subcentimeter nodules in the superior segment right lower lobe, medial left upper lobe, and anterior right upper lobe. Musculoskeletal: No chest wall mass  or suspicious bone lesions identified. CT ABDOMEN PELVIS FINDINGS Hepatobiliary: Nodular hepatic contour. Hypoechoic regions adjacent to the gallbladder fossa and falciform ligament possibly regions of focal fatty infiltration. Portal vein patent. 8 mm calcified gallstone in the central cystic duct. No intrahepatic biliary ductal dilatation. Pancreas: Unremarkable. No pancreatic ductal dilatation or surrounding inflammatory changes. Spleen: Mild splenomegaly 13 cm greatest length.  No focal lesion. Adrenals/Urinary Tract: No adrenal nodule. Symmetric renal enhancement without focal lesion. 3 mm calculus in the lower pole left renal collecting system. No hydronephrosis. Urinary bladder physiologically distended. Stomach/Bowel: Stomach is incompletely distended, unremarkable. Small bowel is nondilated. Some areas of mild wall thickening or suspected and proximal small bowel and the left mid abdomen, without significant adjacent inflammatory/edematous change. Normal appendix. Gas and fecal material partially distend the colon, without acute finding. Vascular/Lymphatic: Moderate calcified aortoiliac atheromatous plaque without aneurysm. Portal vein is patent. Large left retroperitoneal splenorenal venous shunt. There is also a large right mesenteric venous shunt to the Right adrenal vein. no definite gastric or esophageal varices. Reproductive: Status post hysterectomy. No adnexal masses. Other: Multiple peritoneal masses with heavy anterior omental masslike caking, new since 03/06/2022. Small volume abdominal ascites. No free air. Musculoskeletal: Bilateral sacroiliitis. No fracture or worrisome bone lesion. IMPRESSION: 1. Pulmonary nodules, mediastinal adenopathy, peritoneal masses, and omental caking consistent with metastatic disease. 2. Hepatic cirrhosis with splenomegaly, splenorenal and mesenteric venous collaterals, and small volume ascites. 3. Cholelithiasis. 4. Left nephrolithiasis. Electronically Signed   By:  Lucrezia Europe M.D.   On: 12/14/2022 16:00   CT Chest W Contrast  Result Date: 12/14/2022 CLINICAL DATA:  Abdominal pain, right lower lung nodule on chest radiography EXAM: CT CHEST, ABDOMEN, AND PELVIS WITH CONTRAST TECHNIQUE: Multidetector CT imaging of the chest, abdomen and pelvis was performed following the standard protocol during bolus administration of intravenous contrast. RADIATION DOSE REDUCTION: This exam was performed according to the departmental dose-optimization program which includes automated exposure control, adjustment of the mA and/or kV according to patient size and/or use of iterative reconstruction technique. CONTRAST:  48mL OMNIPAQUE IOHEXOL 300 MG/ML  SOLN COMPARISON:  03/06/2022 FINDINGS: CT CHEST FINDINGS Cardiovascular: SVC patent. Heart size normal. Trace pericardial fluid. The RV is nondilated. Coronary calcifications. No thoracic aortic aneurysm. Mediastinum/Nodes: Enlarged 1.5 cm left AP window lymph node. 1.2 cm paraesophageal node just below the level of the carina. Lungs/Pleura: No pleural effusion. No pneumothorax. 1.5 cm subpleural nodule, lateral basal segment right lower lobe. 1 cm nodule centrally at the left lung base (Im74,Se5) . additional subcentimeter nodules in the superior segment right lower lobe, medial left upper lobe, and anterior right upper lobe. Musculoskeletal: No chest wall mass  or suspicious bone lesions identified. CT ABDOMEN PELVIS FINDINGS Hepatobiliary: Nodular hepatic contour. Hypoechoic regions adjacent to the gallbladder fossa and falciform ligament possibly regions of focal fatty infiltration. Portal vein patent. 8 mm calcified gallstone in the central cystic duct. No intrahepatic biliary ductal dilatation. Pancreas: Unremarkable. No pancreatic ductal dilatation or surrounding inflammatory changes. Spleen: Mild splenomegaly 13 cm greatest length.  No focal lesion. Adrenals/Urinary Tract: No adrenal nodule. Symmetric renal enhancement without focal  lesion. 3 mm calculus in the lower pole left renal collecting system. No hydronephrosis. Urinary bladder physiologically distended. Stomach/Bowel: Stomach is incompletely distended, unremarkable. Small bowel is nondilated. Some areas of mild wall thickening or suspected and proximal small bowel and the left mid abdomen, without significant adjacent inflammatory/edematous change. Normal appendix. Gas and fecal material partially distend the colon, without acute finding. Vascular/Lymphatic: Moderate calcified aortoiliac atheromatous plaque without aneurysm. Portal vein is patent. Large left retroperitoneal splenorenal venous shunt. There is also a large right mesenteric venous shunt to the Right adrenal vein. no definite gastric or esophageal varices. Reproductive: Status post hysterectomy. No adnexal masses. Other: Multiple peritoneal masses with heavy anterior omental masslike caking, new since 03/06/2022. Small volume abdominal ascites. No free air. Musculoskeletal: Bilateral sacroiliitis. No fracture or worrisome bone lesion. IMPRESSION: 1. Pulmonary nodules, mediastinal adenopathy, peritoneal masses, and omental caking consistent with metastatic disease. 2. Hepatic cirrhosis with splenomegaly, splenorenal and mesenteric venous collaterals, and small volume ascites. 3. Cholelithiasis. 4. Left nephrolithiasis. Electronically Signed   By: Lucrezia Europe M.D.   On: 12/14/2022 16:00   CT HEAD WO CONTRAST (5MM)  Result Date: 12/14/2022 CLINICAL DATA:  Mental status change EXAM: CT HEAD WITHOUT CONTRAST TECHNIQUE: Contiguous axial images were obtained from the base of the skull through the vertex without intravenous contrast. RADIATION DOSE REDUCTION: This exam was performed according to the departmental dose-optimization program which includes automated exposure control, adjustment of the mA and/or kV according to patient size and/or use of iterative reconstruction technique. COMPARISON:  None Available. FINDINGS:  Brain: No evidence of acute infarction, hemorrhage, hydrocephalus, extra-axial collection or mass lesion/mass effect. There is a encephalomalacia in the right occipital lobe likely related to old infarct. There is mild periventricular white matter hypodensity, likely chronic small vessel ischemic change. Vascular: Atherosclerotic calcifications are present within the cavernous internal carotid arteries. Skull: Normal. Negative for fracture or focal lesion. Sinuses/Orbits: No acute finding. Other: Subcutaneous scalp nodule with calcifications posterior to the occiput measures 14 mm, likely sebaceous cyst. IMPRESSION: 1. No acute intracranial process. 2. 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 3. Mild chronic small vessel ischemic change of the white matter. Electronically Signed   By: Ronney Asters M.D.   On: 12/14/2022 15:43   DG Chest 2 View  Result Date: 12/14/2022 CLINICAL DATA:  Altered mental status. EXAM: CHEST - 2 VIEW COMPARISON:  12/12/2020 FINDINGS: Heart size appears normal. Aortic atherosclerosis. Low lung volumes with pulmonary vascular congestion. Central airway thickening. No airspace consolidation. Nodular density within the periphery of the right lower lung is indeterminate measuring 1.1 cm. Not confidently identified on previous imaging. The visualized osseous structures appear intact. IMPRESSION: 1. Low lung volumes and pulmonary vascular congestion. 2. Central airway thickening compatible with bronchitis or reactive airways disease. 3. Indeterminate 1.1 cm nodule within the periphery of the right lower lung. Recommend further evaluation with nonemergent CT of the chest. Electronically Signed   By: Kerby Moors M.D.   On: 12/14/2022 13:10    Microbiology: Recent Results (from  the past 240 hour(s))  Blood culture (routine x 2)     Status: None (Preliminary result)   Collection Time: 12/14/22  2:05 PM   Specimen: BLOOD LEFT HAND  Result Value Ref  Range Status   Specimen Description   Final    BLOOD LEFT HAND Performed at [facility] [address], [place], [place] [address]    Special Requests   Final    BOTTLES DRAWN AEROBIC AND ANAEROBIC Blood Culture results may not be optimal due to an inadequate volume of blood received in culture bottles Performed at [facility] [address] [place], [place] [address]    Culture   Final    NO GROWTH < 24 HOURS Performed at [facility] [address], [place], [place] [address]    Report Status PENDING  Incomplete  Blood culture (routine x 2)     Status: None (Preliminary result)   Collection Time: 12/14/22  2:10 PM   Specimen: BLOOD RIGHT HAND  Result Value Ref Range Status   Specimen Description   Final    BLOOD RIGHT HAND Performed at [facility] [address], [place], [place] [address]    Special Requests   Final    BOTTLES DRAWN AEROBIC AND ANAEROBIC Blood Culture results may not be optimal due to an inadequate volume of blood received in culture bottles Performed at [facility] [address] [place], [place] [address]    Culture   Final    NO GROWTH < 24 HOURS Performed at [facility] [address], [place], [place] [address]    Report Status PENDING  Incomplete     Labs: Basic Metabolic Panel: Recent Labs  Lab 12/14/22 1243  NA 132*  K 4.9  CL 98  CO2 21*  GLUCOSE 149*  BUN 62*  CREATININE 1.65*  CALCIUM 8.7*   Liver Function Tests: Recent Labs  Lab 12/14/22 1243  AST 104*  ALT 25  ALKPHOS 75  BILITOT 0.8  PROT 6.0*  ALBUMIN 2.6*   Recent Labs  Lab 12/14/22 1243  LIPASE 38   No results for input(s): "AMMONIA" in the last 168 hours. CBC: Recent Labs  Lab 12/14/22 1243  WBC 20.1*  HGB 10.6*  HCT 32.7*  MCV 93.2  PLT 362   Cardiac Enzymes: No results for input(s): "CKTOTAL", "CKMB", "CKMBINDEX", "TROPONINI" in the last 168  hours. BNP: BNP (last 3 results) No results for input(s): "BNP" in the last 8760 hours.  ProBNP (last 3 results) No results for input(s): "PROBNP" in the last 8760 hours.  CBG: Recent Labs  Lab 12/14/22 1219  GLUCAP 146*       Signed:  Nita Sells MD   [facility] 12/15/2022, 10:41 AM

## 2022-12-19 LAB — CULTURE, BLOOD (ROUTINE X 2)
Culture: NO GROWTH
Culture: NO GROWTH

## 2023-01-01 DEATH — deceased

## 2023-01-22 ENCOUNTER — Encounter: Payer: Self-pay | Admitting: Neurology

## 2023-02-06 ENCOUNTER — Encounter: Payer: Self-pay | Admitting: Gynecologic Oncology

## 2023-02-19 ENCOUNTER — Ambulatory Visit: Payer: Medicare Other | Admitting: Neurology

## 2024-02-07 IMAGING — CT CT ABD-PELV W/ CM
2 of 5 series · 15 of 46 positions shown, 17 images · IV contrast (APPLIED)
Comparison: 12/15/2020 pelvic ultrasound.

CLINICAL DATA: Uterine/cervical cancer. Acute onset of dementia.
Staging.

EXAM:
CT ABDOMEN AND PELVIS WITH CONTRAST
TECHNIQUE: Multidetector CT imaging of the abdomen and pelvis was performed
using the standard protocol following bolus administration of
intravenous contrast.

[Series 2: axial st · axial · 0.94mm/px · z∈[+1110,+1525]mm · 12 of 95 slices shown, 14 images]
[im 6/95  soft-tissue]
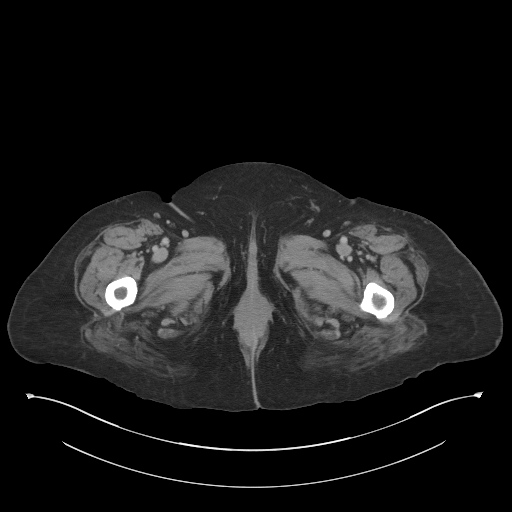
[im 6/95  bone]
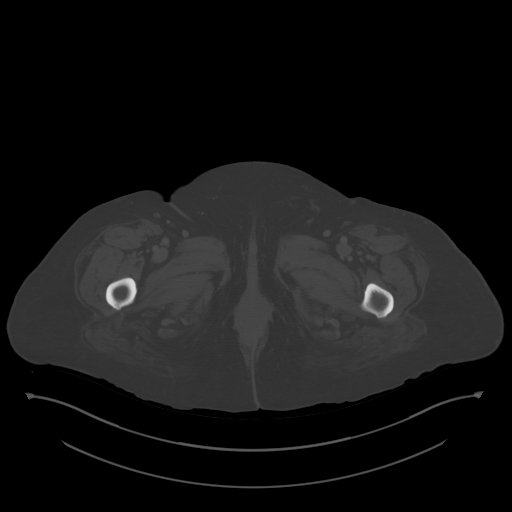
[im 17/95  soft-tissue]
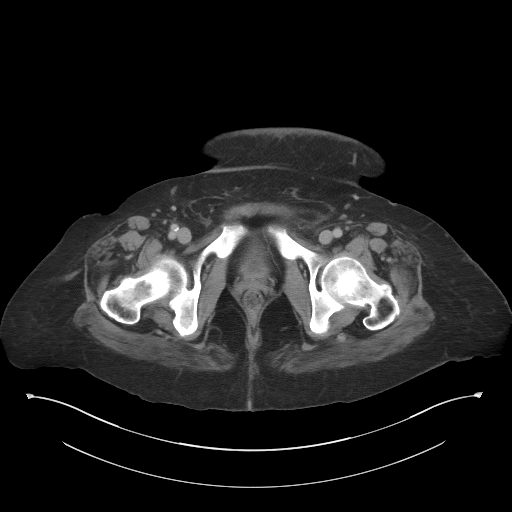
[im 23/95  soft-tissue]
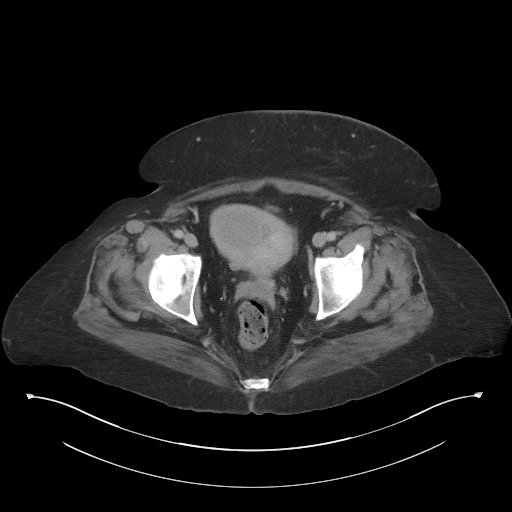
[im 28/95  soft-tissue]
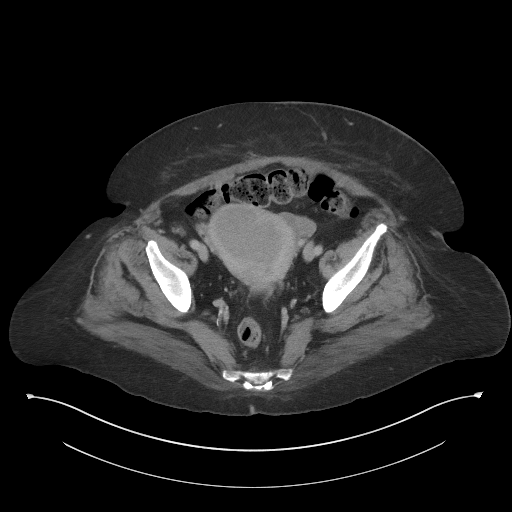
[im 39/95  soft-tissue]
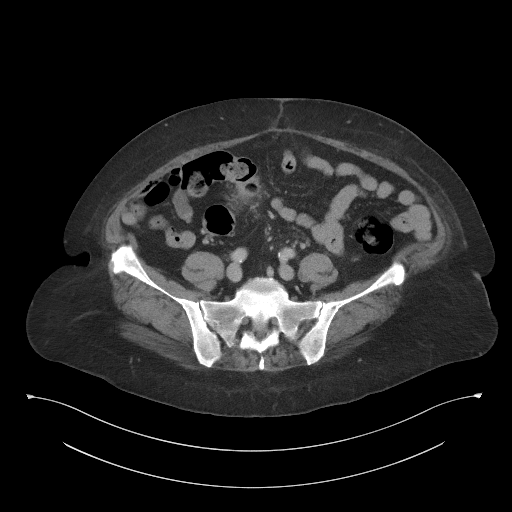
[im 45/95  soft-tissue]
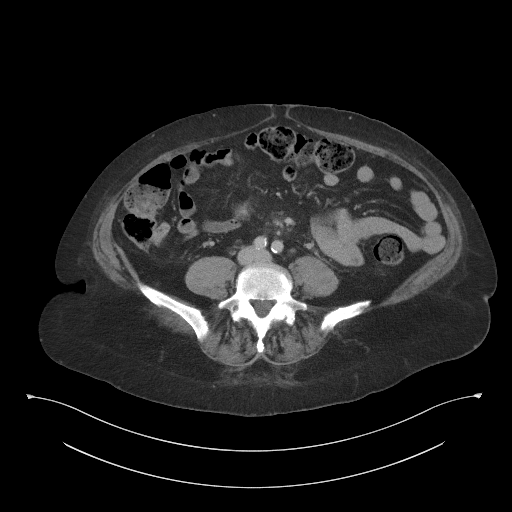
[im 50/95  soft-tissue]
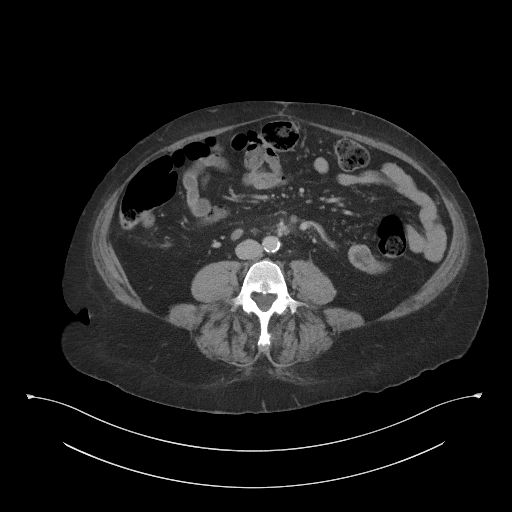
[im 61/95  soft-tissue]
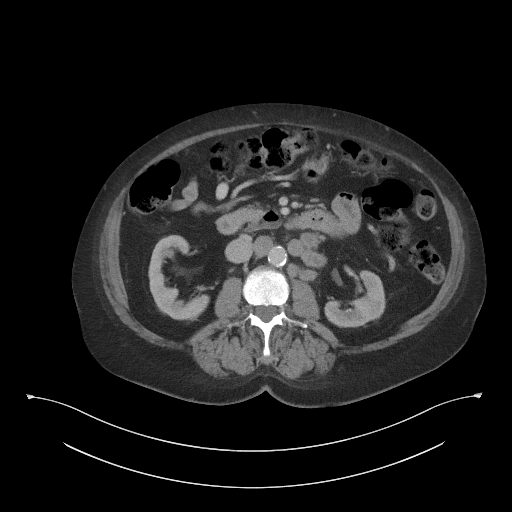
[im 67/95  soft-tissue]
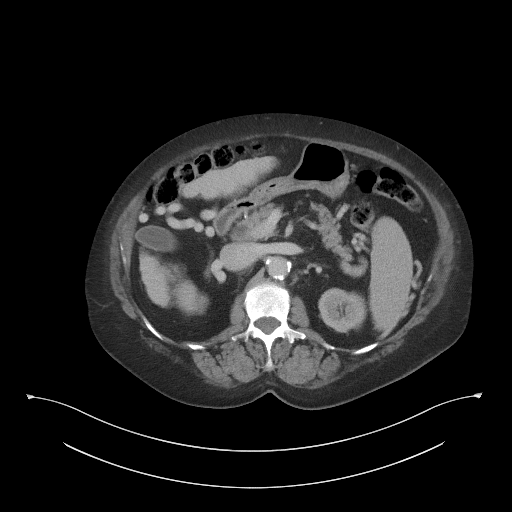
[im 67/95  bone]
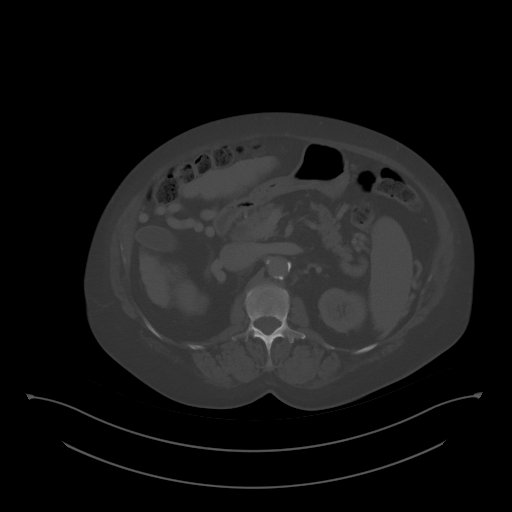
[im 72/95  soft-tissue]
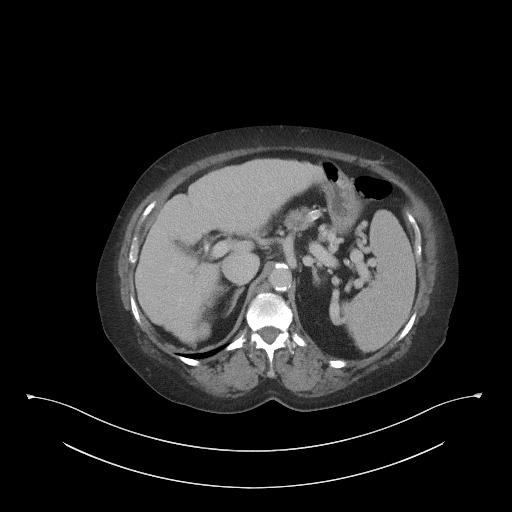
[im 83/95  soft-tissue]
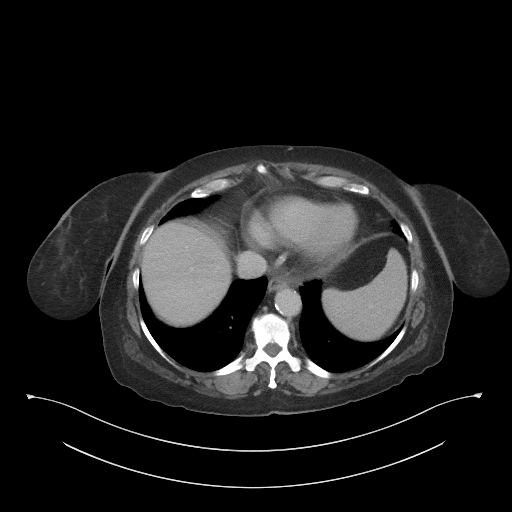
[im 89/95  soft-tissue]
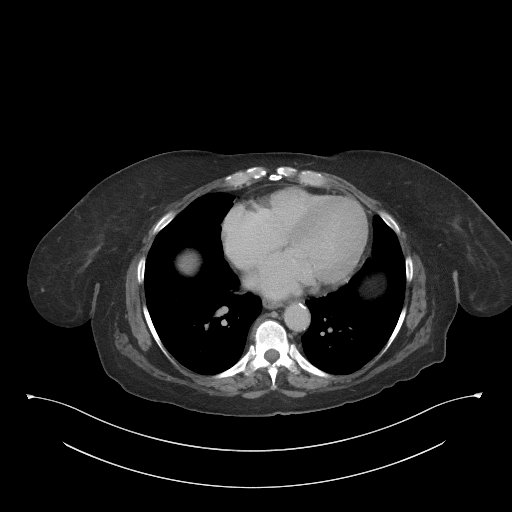

[Series 4: coronal st · coronal · 0.79mm/px · 3 of 101 slices shown]
[im 34/101  soft-tissue]
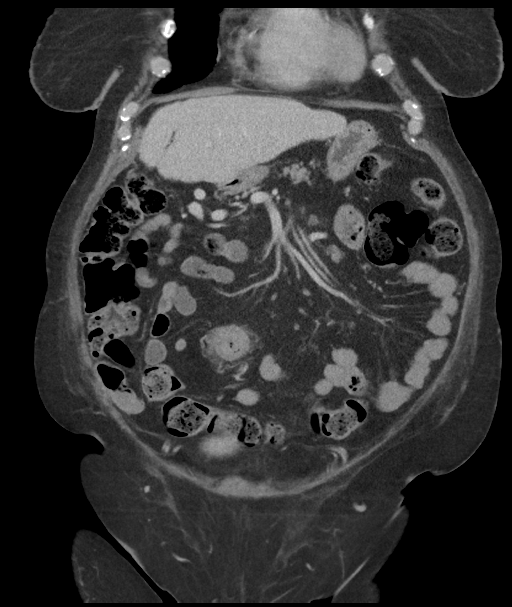
[im 45/101  soft-tissue]
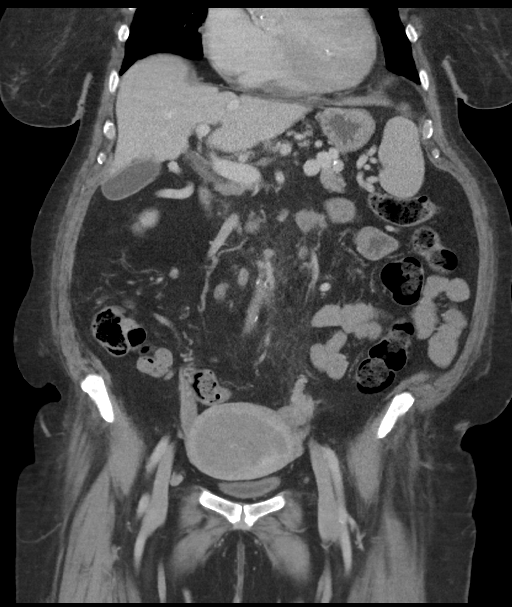
[im 56/101  soft-tissue]
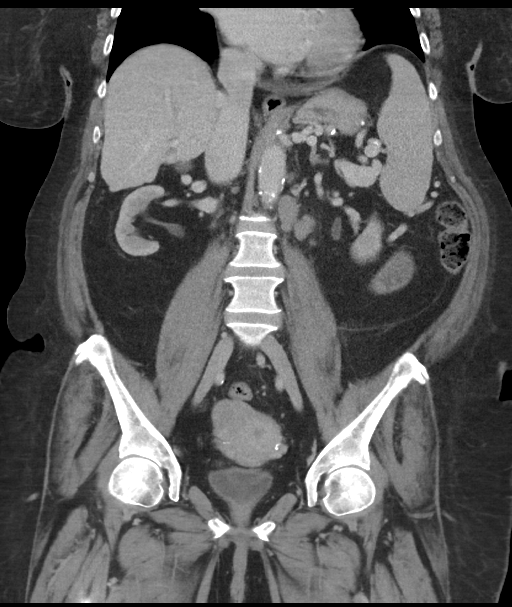

[15 of 46 positions shown; findings below may reference images not displayed]

RADIATION DOSE REDUCTION: This exam was performed according to the
departmental dose-optimization program which includes automated
exposure control, adjustment of the mA and/or kV according to
patient size and/or use of iterative reconstruction technique.

CONTRAST:  100mL OMNIPAQUE IOHEXOL 300 MG/ML  SOLN
FINDINGS: Lower chest: Tiny right lower lobe calcified granuloma. Probable
calcified lingular 2 mm nodule, favoring a granuloma. Mild
cardiomegaly with aortic valve and right coronary artery
calcification. Right retrocrural necrotic node at 8 mm on [DATE].

Hepatobiliary: Advanced cirrhosis.  No focal liver lesion.

A stone in the gallbladder neck measures 7 mm. No acute
cholecystitis or biliary duct dilatation.

Pancreas: Normal, without mass or ductal dilatation.

Spleen: Borderline splenomegaly at 12.0 cm craniocaudal.

Adrenals/Urinary Tract: Normal adrenal glands. Punctate left renal
collecting system calculi. Left renal too small to characterize
lesions. No hydronephrosis. Normal urinary bladder.

Stomach/Bowel: Normal stomach, without wall thickening. Transverse
duodenal diverticulum. Otherwise normal small bowel.

Large colonic stool burden. Focal wall thickening of and
interstitial infiltration surrounding the sigmoid on 54/2 and 34
coronal. No obstruction. There is a large colonic stool burden.
Normal terminal ileum and appendix.

Vascular/Lymphatic: Aortic and branch vessel atherosclerosis. Portal
venous hypertension, with extensive abdominal portacaval
portosystemic collaterals. Extensive abdominal retroperitoneal
adenopathy. Left periaortic 1.8 cm node on 42/2. Multiple necrotic
more cephalad abdominal retroperitoneal nodes, including an
aortocaval 1.4 cm node on 35/2.

No pelvic sidewall adenopathy. Enlarged node at the root of the
sigmoid mesocolon including at 6 mm on 45/2.

Reproductive: Central uterine hypoattenuation presumably represents
the endometrial primary. Example 6.1 x 6.0 cm on 69/2.

No adnexal mass.

Other: No significant free fluid. Mild pelvic floor laxity. No
evidence of omental or peritoneal disease. Small fat containing
paraumbilical hernia.

Musculoskeletal: No acute osseous abnormality.
IMPRESSION: 1. Central uterine mass, most consistent with the clinical history
of endometrial carcinoma.
2. Focal sigmoid colonic wall and surrounding interstitial
thickening, most consistent with carcinoma with probable transmural
spread.
3. Abdominal and lower thoracic/retrocrural nodal metastasis which
could be from either of the patient's primaries. Uterine metastasis
slightly favored, although absence of pelvic sidewall adenopathy is
somewhat atypical. Consider tissue sampling.
4. Cirrhosis and portal venous hypertension.
5. Coronary artery atherosclerosis. Aortic Atherosclerosis
([id number]).
6. Aortic valvular calcifications. Consider echocardiography to
evaluate for valvular dysfunction.
7. Left nephrolithiasis
8. Cholelithiasis

These results will be called to the ordering clinician or
representative by the Radiologist Assistant, and communication
documented in the PACS or [REDACTED].
